# Patient Record
Sex: Female | Born: 1945
Health system: Southern US, Community
[De-identification: ages and names within clinical notes are randomized; demographics above are authoritative.]

## PROBLEM LIST (undated history)

## (undated) DIAGNOSIS — Z79890 Hormone replacement therapy (postmenopausal): Secondary | ICD-10-CM

## (undated) DIAGNOSIS — Z853 Personal history of malignant neoplasm of breast: Secondary | ICD-10-CM

## (undated) DIAGNOSIS — Z87898 Personal history of other specified conditions: Secondary | ICD-10-CM

## (undated) DIAGNOSIS — Z923 Personal history of irradiation: Secondary | ICD-10-CM

## (undated) DIAGNOSIS — E785 Hyperlipidemia, unspecified: Secondary | ICD-10-CM

## (undated) DIAGNOSIS — Z9889 Other specified postprocedural states: Secondary | ICD-10-CM

## (undated) DIAGNOSIS — R3915 Urgency of urination: Secondary | ICD-10-CM

## (undated) DIAGNOSIS — Z9221 Personal history of antineoplastic chemotherapy: Secondary | ICD-10-CM

## (undated) DIAGNOSIS — Z8489 Family history of other specified conditions: Secondary | ICD-10-CM

## (undated) DIAGNOSIS — E119 Type 2 diabetes mellitus without complications: Secondary | ICD-10-CM

## (undated) DIAGNOSIS — F419 Anxiety disorder, unspecified: Secondary | ICD-10-CM

## (undated) DIAGNOSIS — Z8719 Personal history of other diseases of the digestive system: Secondary | ICD-10-CM

## (undated) DIAGNOSIS — C801 Malignant (primary) neoplasm, unspecified: Secondary | ICD-10-CM

## (undated) DIAGNOSIS — Z87440 Personal history of urinary (tract) infections: Secondary | ICD-10-CM

## (undated) DIAGNOSIS — R32 Unspecified urinary incontinence: Secondary | ICD-10-CM

## (undated) DIAGNOSIS — G8929 Other chronic pain: Secondary | ICD-10-CM

## (undated) DIAGNOSIS — Z973 Presence of spectacles and contact lenses: Secondary | ICD-10-CM

## (undated) DIAGNOSIS — I10 Essential (primary) hypertension: Secondary | ICD-10-CM

## (undated) DIAGNOSIS — Z90710 Acquired absence of both cervix and uterus: Secondary | ICD-10-CM

## (undated) DIAGNOSIS — R112 Nausea with vomiting, unspecified: Secondary | ICD-10-CM

## (undated) DIAGNOSIS — J3089 Other allergic rhinitis: Secondary | ICD-10-CM

## (undated) DIAGNOSIS — N200 Calculus of kidney: Secondary | ICD-10-CM

## (undated) DIAGNOSIS — C50919 Malignant neoplasm of unspecified site of unspecified female breast: Secondary | ICD-10-CM

## (undated) DIAGNOSIS — M5030 Other cervical disc degeneration, unspecified cervical region: Secondary | ICD-10-CM

## (undated) DIAGNOSIS — E039 Hypothyroidism, unspecified: Secondary | ICD-10-CM

## (undated) DIAGNOSIS — Z780 Asymptomatic menopausal state: Secondary | ICD-10-CM

## (undated) HISTORY — PX: TONSILLECTOMY: SUR1361

## (undated) HISTORY — DX: Hypothyroidism, unspecified: E03.9

## (undated) HISTORY — PX: INCONTINENCE SURGERY: SHX676

## (undated) HISTORY — PX: PARTIAL MASTECTOMY WITH AXILLARY SENTINEL LYMPH NODE BIOPSY: SHX6004

## (undated) HISTORY — PX: CATARACT EXTRACTION W/ INTRAOCULAR LENS  IMPLANT, BILATERAL: SHX1307

## (undated) HISTORY — PX: VAGINAL HYSTERECTOMY: SUR661

## (undated) HISTORY — DX: Essential (primary) hypertension: I10

## (undated) HISTORY — PX: RETROPUBIC SLING: SHX2343

## (undated) HISTORY — PX: MASTECTOMY WITH AXILLARY LYMPH NODE DISSECTION: SHX5661

## (undated) HISTORY — DX: Unspecified urinary incontinence: R32

## (undated) HISTORY — DX: Hyperlipidemia, unspecified: E78.5

## (undated) HISTORY — DX: Acquired absence of both cervix and uterus: Z90.710

## (undated) HISTORY — DX: Anxiety disorder, unspecified: F41.9

## (undated) HISTORY — DX: Hormone replacement therapy: Z79.890

## (undated) HISTORY — DX: Asymptomatic menopausal state: Z78.0

## (undated) HISTORY — DX: Malignant neoplasm of unspecified site of unspecified female breast: C50.919

---

## 1999-01-13 ENCOUNTER — Other Ambulatory Visit: Admission: RE | Admit: 1999-01-13 | Discharge: 1999-01-13 | Payer: Self-pay | Admitting: *Deleted

## 1999-02-08 ENCOUNTER — Encounter: Payer: Self-pay | Admitting: *Deleted

## 1999-02-08 ENCOUNTER — Ambulatory Visit (HOSPITAL_COMMUNITY): Admission: RE | Admit: 1999-02-08 | Discharge: 1999-02-08 | Payer: Self-pay | Admitting: *Deleted

## 1999-02-22 ENCOUNTER — Ambulatory Visit (HOSPITAL_COMMUNITY): Admission: RE | Admit: 1999-02-22 | Discharge: 1999-02-22 | Payer: Self-pay | Admitting: Obstetrics and Gynecology

## 1999-02-22 ENCOUNTER — Encounter (INDEPENDENT_AMBULATORY_CARE_PROVIDER_SITE_OTHER): Payer: Self-pay | Admitting: Specialist

## 1999-06-09 ENCOUNTER — Other Ambulatory Visit: Admission: RE | Admit: 1999-06-09 | Discharge: 1999-06-09 | Payer: Self-pay | Admitting: *Deleted

## 2000-01-21 ENCOUNTER — Other Ambulatory Visit: Admission: RE | Admit: 2000-01-21 | Discharge: 2000-01-21 | Payer: Self-pay | Admitting: *Deleted

## 2001-01-29 ENCOUNTER — Other Ambulatory Visit: Admission: RE | Admit: 2001-01-29 | Discharge: 2001-01-29 | Payer: Self-pay | Admitting: *Deleted

## 2001-12-24 ENCOUNTER — Other Ambulatory Visit: Admission: RE | Admit: 2001-12-24 | Discharge: 2001-12-24 | Payer: Self-pay | Admitting: *Deleted

## 2002-02-04 HISTORY — PX: ABDOMINAL HYSTERECTOMY: SHX81

## 2002-02-19 ENCOUNTER — Observation Stay (HOSPITAL_COMMUNITY): Admission: RE | Admit: 2002-02-19 | Discharge: 2002-02-20 | Payer: Self-pay | Admitting: Obstetrics and Gynecology

## 2002-02-19 ENCOUNTER — Encounter (INDEPENDENT_AMBULATORY_CARE_PROVIDER_SITE_OTHER): Payer: Self-pay | Admitting: Specialist

## 2002-03-07 HISTORY — PX: BREAST LUMPECTOMY: SHX2

## 2002-04-07 HISTORY — PX: BREAST LUMPECTOMY: SHX2

## 2002-04-11 ENCOUNTER — Other Ambulatory Visit: Admission: RE | Admit: 2002-04-11 | Discharge: 2002-04-11 | Payer: Self-pay | Admitting: *Deleted

## 2002-04-12 ENCOUNTER — Encounter: Admission: RE | Admit: 2002-04-12 | Discharge: 2002-04-12 | Payer: Self-pay | Admitting: *Deleted

## 2002-04-12 ENCOUNTER — Encounter: Payer: Self-pay | Admitting: *Deleted

## 2002-04-18 ENCOUNTER — Encounter: Payer: Self-pay | Admitting: General Surgery

## 2002-04-18 ENCOUNTER — Ambulatory Visit (HOSPITAL_COMMUNITY): Admission: RE | Admit: 2002-04-18 | Discharge: 2002-04-19 | Payer: Self-pay | Admitting: General Surgery

## 2002-04-19 ENCOUNTER — Encounter: Admission: RE | Admit: 2002-04-19 | Discharge: 2002-04-19 | Payer: Self-pay | Admitting: General Surgery

## 2002-04-19 ENCOUNTER — Encounter: Payer: Self-pay | Admitting: General Surgery

## 2002-04-22 ENCOUNTER — Encounter: Payer: Self-pay | Admitting: *Deleted

## 2002-04-22 ENCOUNTER — Encounter (INDEPENDENT_AMBULATORY_CARE_PROVIDER_SITE_OTHER): Payer: Self-pay | Admitting: *Deleted

## 2002-04-22 ENCOUNTER — Encounter: Admission: RE | Admit: 2002-04-22 | Discharge: 2002-04-22 | Payer: Self-pay | Admitting: *Deleted

## 2002-04-22 HISTORY — PX: BREAST BIOPSY: SHX20

## 2002-04-24 ENCOUNTER — Ambulatory Visit (HOSPITAL_BASED_OUTPATIENT_CLINIC_OR_DEPARTMENT_OTHER): Admission: RE | Admit: 2002-04-24 | Discharge: 2002-04-24 | Payer: Self-pay | Admitting: General Surgery

## 2002-04-24 ENCOUNTER — Encounter: Payer: Self-pay | Admitting: General Surgery

## 2002-04-24 ENCOUNTER — Encounter (INDEPENDENT_AMBULATORY_CARE_PROVIDER_SITE_OTHER): Payer: Self-pay | Admitting: Specialist

## 2002-05-06 DIAGNOSIS — Z9221 Personal history of antineoplastic chemotherapy: Secondary | ICD-10-CM

## 2002-05-06 HISTORY — DX: Personal history of antineoplastic chemotherapy: Z92.21

## 2002-05-13 ENCOUNTER — Ambulatory Visit: Admission: RE | Admit: 2002-05-13 | Discharge: 2002-06-20 | Payer: Self-pay | Admitting: Radiation Oncology

## 2002-05-13 ENCOUNTER — Ambulatory Visit (HOSPITAL_COMMUNITY): Admission: RE | Admit: 2002-05-13 | Discharge: 2002-05-13 | Payer: Self-pay | Admitting: Oncology

## 2002-05-13 ENCOUNTER — Encounter: Payer: Self-pay | Admitting: Oncology

## 2002-05-15 ENCOUNTER — Encounter: Payer: Self-pay | Admitting: Oncology

## 2002-05-15 ENCOUNTER — Ambulatory Visit (HOSPITAL_COMMUNITY): Admission: RE | Admit: 2002-05-15 | Discharge: 2002-05-15 | Payer: Self-pay | Admitting: Oncology

## 2002-05-22 ENCOUNTER — Ambulatory Visit (HOSPITAL_BASED_OUTPATIENT_CLINIC_OR_DEPARTMENT_OTHER): Admission: RE | Admit: 2002-05-22 | Discharge: 2002-05-22 | Payer: Self-pay | Admitting: General Surgery

## 2002-05-22 ENCOUNTER — Encounter: Payer: Self-pay | Admitting: General Surgery

## 2002-07-11 ENCOUNTER — Inpatient Hospital Stay (HOSPITAL_COMMUNITY): Admission: EM | Admit: 2002-07-11 | Discharge: 2002-07-14 | Payer: Self-pay | Admitting: Emergency Medicine

## 2002-07-11 ENCOUNTER — Encounter: Payer: Self-pay | Admitting: *Deleted

## 2002-08-27 ENCOUNTER — Ambulatory Visit (HOSPITAL_COMMUNITY): Admission: RE | Admit: 2002-08-27 | Discharge: 2002-08-27 | Payer: Self-pay | Admitting: Oncology

## 2002-08-27 ENCOUNTER — Encounter: Payer: Self-pay | Admitting: Oncology

## 2002-11-25 ENCOUNTER — Encounter: Payer: Self-pay | Admitting: Oncology

## 2002-11-25 ENCOUNTER — Ambulatory Visit (HOSPITAL_COMMUNITY): Admission: RE | Admit: 2002-11-25 | Discharge: 2002-11-25 | Payer: Self-pay | Admitting: Oncology

## 2002-12-12 ENCOUNTER — Ambulatory Visit: Admission: RE | Admit: 2002-12-12 | Discharge: 2003-02-15 | Payer: Self-pay | Admitting: Radiation Oncology

## 2002-12-18 ENCOUNTER — Encounter: Admission: RE | Admit: 2002-12-18 | Discharge: 2002-12-18 | Payer: Self-pay | Admitting: Radiation Oncology

## 2002-12-26 DIAGNOSIS — Z923 Personal history of irradiation: Secondary | ICD-10-CM

## 2002-12-26 HISTORY — DX: Personal history of irradiation: Z92.3

## 2003-01-07 ENCOUNTER — Encounter: Admission: RE | Admit: 2003-01-07 | Discharge: 2003-01-07 | Payer: Self-pay | Admitting: Oncology

## 2003-02-26 ENCOUNTER — Ambulatory Visit (HOSPITAL_COMMUNITY): Admission: RE | Admit: 2003-02-26 | Discharge: 2003-02-26 | Payer: Self-pay | Admitting: Oncology

## 2003-03-04 ENCOUNTER — Ambulatory Visit (HOSPITAL_BASED_OUTPATIENT_CLINIC_OR_DEPARTMENT_OTHER): Admission: RE | Admit: 2003-03-04 | Discharge: 2003-03-04 | Payer: Self-pay | Admitting: General Surgery

## 2003-03-13 ENCOUNTER — Other Ambulatory Visit: Admission: RE | Admit: 2003-03-13 | Discharge: 2003-03-13 | Payer: Self-pay | Admitting: *Deleted

## 2003-03-24 ENCOUNTER — Encounter: Admission: RE | Admit: 2003-03-24 | Discharge: 2003-03-24 | Payer: Self-pay | Admitting: Oncology

## 2003-03-24 ENCOUNTER — Ambulatory Visit: Admission: RE | Admit: 2003-03-24 | Discharge: 2003-03-24 | Payer: Self-pay | Admitting: Radiation Oncology

## 2003-08-26 ENCOUNTER — Encounter: Admission: RE | Admit: 2003-08-26 | Discharge: 2003-08-26 | Payer: Self-pay | Admitting: *Deleted

## 2003-08-26 ENCOUNTER — Ambulatory Visit: Admission: RE | Admit: 2003-08-26 | Discharge: 2003-08-26 | Payer: Self-pay | Admitting: Radiation Oncology

## 2003-11-25 ENCOUNTER — Ambulatory Visit (HOSPITAL_COMMUNITY): Admission: RE | Admit: 2003-11-25 | Discharge: 2003-11-25 | Payer: Self-pay | Admitting: Oncology

## 2003-11-30 ENCOUNTER — Emergency Department (HOSPITAL_COMMUNITY): Admission: EM | Admit: 2003-11-30 | Discharge: 2003-12-01 | Payer: Self-pay

## 2004-01-08 ENCOUNTER — Ambulatory Visit: Payer: Self-pay | Admitting: Oncology

## 2004-02-27 ENCOUNTER — Ambulatory Visit: Payer: Self-pay | Admitting: Oncology

## 2004-04-15 ENCOUNTER — Encounter: Admission: RE | Admit: 2004-04-15 | Discharge: 2004-04-15 | Payer: Self-pay | Admitting: General Surgery

## 2004-04-15 ENCOUNTER — Ambulatory Visit: Payer: Self-pay | Admitting: Oncology

## 2004-05-11 ENCOUNTER — Encounter: Admission: RE | Admit: 2004-05-11 | Discharge: 2004-05-11 | Payer: Self-pay | Admitting: Oncology

## 2004-05-14 ENCOUNTER — Ambulatory Visit (HOSPITAL_COMMUNITY): Admission: RE | Admit: 2004-05-14 | Discharge: 2004-05-14 | Payer: Self-pay | Admitting: Oncology

## 2004-06-01 ENCOUNTER — Ambulatory Visit: Payer: Self-pay | Admitting: Oncology

## 2004-08-10 ENCOUNTER — Ambulatory Visit: Payer: Self-pay | Admitting: Oncology

## 2004-10-08 ENCOUNTER — Encounter: Admission: RE | Admit: 2004-10-08 | Discharge: 2004-10-08 | Payer: Self-pay | Admitting: General Surgery

## 2004-11-17 ENCOUNTER — Ambulatory Visit: Payer: Self-pay | Admitting: Oncology

## 2005-02-15 ENCOUNTER — Ambulatory Visit: Payer: Self-pay | Admitting: Oncology

## 2005-04-18 ENCOUNTER — Encounter: Admission: RE | Admit: 2005-04-18 | Discharge: 2005-04-18 | Payer: Self-pay | Admitting: Oncology

## 2005-06-22 ENCOUNTER — Ambulatory Visit: Payer: Self-pay | Admitting: Oncology

## 2005-12-06 ENCOUNTER — Ambulatory Visit: Payer: Self-pay | Admitting: Oncology

## 2005-12-08 ENCOUNTER — Ambulatory Visit (HOSPITAL_COMMUNITY): Admission: RE | Admit: 2005-12-08 | Discharge: 2005-12-08 | Payer: Self-pay | Admitting: Oncology

## 2005-12-16 ENCOUNTER — Ambulatory Visit (HOSPITAL_COMMUNITY): Admission: RE | Admit: 2005-12-16 | Discharge: 2005-12-16 | Payer: Self-pay | Admitting: Oncology

## 2006-01-18 ENCOUNTER — Encounter: Admission: RE | Admit: 2006-01-18 | Discharge: 2006-01-18 | Payer: Self-pay | Admitting: Sports Medicine

## 2006-02-10 ENCOUNTER — Encounter: Admission: RE | Admit: 2006-02-10 | Discharge: 2006-02-10 | Payer: Self-pay | Admitting: Sports Medicine

## 2006-04-19 ENCOUNTER — Encounter: Admission: RE | Admit: 2006-04-19 | Discharge: 2006-04-19 | Payer: Self-pay | Admitting: Oncology

## 2006-05-15 ENCOUNTER — Encounter: Admission: RE | Admit: 2006-05-15 | Discharge: 2006-05-15 | Payer: Self-pay | Admitting: Oncology

## 2006-07-10 ENCOUNTER — Ambulatory Visit: Payer: Self-pay | Admitting: Oncology

## 2006-07-11 LAB — COMPREHENSIVE METABOLIC PANEL
ALT: 26 U/L (ref 0–35)
AST: 26 U/L (ref 0–37)
CO2: 25 mEq/L (ref 19–32)
Calcium: 9.5 mg/dL (ref 8.4–10.5)
Chloride: 103 mEq/L (ref 96–112)
Potassium: 4.3 mEq/L (ref 3.5–5.3)
Sodium: 139 mEq/L (ref 135–145)
Total Protein: 6.9 g/dL (ref 6.0–8.3)

## 2006-07-11 LAB — CBC WITH DIFFERENTIAL/PLATELET
BASO%: 3.2 % — ABNORMAL HIGH (ref 0.0–2.0)
Eosinophils Absolute: 0.2 10*3/uL (ref 0.0–0.5)
MONO#: 0.3 10*3/uL (ref 0.1–0.9)
NEUT#: 0.8 10*3/uL — ABNORMAL LOW (ref 1.5–6.5)
Platelets: 231 10*3/uL (ref 145–400)
RBC: 4.64 10*6/uL (ref 3.70–5.32)
RDW: 11.4 % (ref 11.3–14.5)
WBC: 3.4 10*3/uL — ABNORMAL LOW (ref 3.9–10.0)
lymph#: 2 10*3/uL (ref 0.9–3.3)

## 2006-07-11 LAB — CHCC SMEAR

## 2007-01-08 ENCOUNTER — Ambulatory Visit: Payer: Self-pay | Admitting: Oncology

## 2007-01-10 LAB — CBC WITH DIFFERENTIAL/PLATELET
Basophils Absolute: 0 10*3/uL (ref 0.0–0.1)
EOS%: 1.5 % (ref 0.0–7.0)
Eosinophils Absolute: 0 10*3/uL (ref 0.0–0.5)
HCT: 35.3 % (ref 34.8–46.6)
HGB: 12.4 g/dL (ref 11.6–15.9)
MCH: 29.9 pg (ref 26.0–34.0)
MCV: 85.2 fL (ref 81.0–101.0)
MONO%: 15.9 % — ABNORMAL HIGH (ref 0.0–13.0)
NEUT#: 0.8 10*3/uL — ABNORMAL LOW (ref 1.5–6.5)
NEUT%: 38.4 % — ABNORMAL LOW (ref 39.6–76.8)
Platelets: 256 10*3/uL (ref 145–400)

## 2007-01-10 LAB — COMPREHENSIVE METABOLIC PANEL
AST: 21 U/L (ref 0–37)
Albumin: 4.2 g/dL (ref 3.5–5.2)
Alkaline Phosphatase: 44 U/L (ref 39–117)
BUN: 14 mg/dL (ref 6–23)
Calcium: 10.1 mg/dL (ref 8.4–10.5)
Creatinine, Ser: 0.77 mg/dL (ref 0.40–1.20)
Glucose, Bld: 104 mg/dL — ABNORMAL HIGH (ref 70–99)

## 2007-01-10 LAB — LACTATE DEHYDROGENASE: LDH: 156 U/L (ref 94–250)

## 2007-04-23 ENCOUNTER — Encounter: Admission: RE | Admit: 2007-04-23 | Discharge: 2007-04-23 | Payer: Self-pay | Admitting: Oncology

## 2007-05-01 ENCOUNTER — Encounter: Admission: RE | Admit: 2007-05-01 | Discharge: 2007-05-01 | Payer: Self-pay | Admitting: Oncology

## 2007-05-04 ENCOUNTER — Encounter (INDEPENDENT_AMBULATORY_CARE_PROVIDER_SITE_OTHER): Payer: Self-pay | Admitting: Diagnostic Radiology

## 2007-05-04 ENCOUNTER — Encounter: Admission: RE | Admit: 2007-05-04 | Discharge: 2007-05-04 | Payer: Self-pay | Admitting: Oncology

## 2007-05-04 HISTORY — PX: BREAST BIOPSY: SHX20

## 2007-07-09 ENCOUNTER — Ambulatory Visit: Payer: Self-pay | Admitting: Oncology

## 2007-07-12 LAB — CBC WITH DIFFERENTIAL/PLATELET
BASO%: 0.5 % (ref 0.0–2.0)
Basophils Absolute: 0 10*3/uL (ref 0.0–0.1)
Eosinophils Absolute: 0.1 10*3/uL (ref 0.0–0.5)
HCT: 36.5 % (ref 34.8–46.6)
HGB: 12.5 g/dL (ref 11.6–15.9)
LYMPH%: 51.9 % — ABNORMAL HIGH (ref 14.0–48.0)
MONO#: 0.4 10*3/uL (ref 0.1–0.9)
NEUT#: 1 10*3/uL — ABNORMAL LOW (ref 1.5–6.5)
NEUT%: 31.4 % — ABNORMAL LOW (ref 39.6–76.8)
Platelets: 291 10*3/uL (ref 145–400)
WBC: 3 10*3/uL — ABNORMAL LOW (ref 3.9–10.0)
lymph#: 1.6 10*3/uL (ref 0.9–3.3)

## 2007-07-13 LAB — VITAMIN D 25 HYDROXY (VIT D DEFICIENCY, FRACTURES): Vit D, 25-Hydroxy: 30 ng/mL (ref 30–89)

## 2007-07-13 LAB — COMPREHENSIVE METABOLIC PANEL
ALT: 24 U/L (ref 0–35)
CO2: 24 mEq/L (ref 19–32)
Calcium: 9.4 mg/dL (ref 8.4–10.5)
Chloride: 107 mEq/L (ref 96–112)
Creatinine, Ser: 0.74 mg/dL (ref 0.40–1.20)
Glucose, Bld: 130 mg/dL — ABNORMAL HIGH (ref 70–99)
Total Bilirubin: 0.4 mg/dL (ref 0.3–1.2)

## 2007-07-13 LAB — CANCER ANTIGEN 27.29: CA 27.29: 13 U/mL (ref 0–39)

## 2007-07-13 LAB — FSH/LH: LH: 27.2 m[IU]/mL

## 2007-07-13 LAB — LACTATE DEHYDROGENASE: LDH: 171 U/L (ref 94–250)

## 2007-07-16 ENCOUNTER — Ambulatory Visit (HOSPITAL_COMMUNITY): Admission: RE | Admit: 2007-07-16 | Discharge: 2007-07-16 | Payer: Self-pay | Admitting: Oncology

## 2007-07-19 LAB — ESTRADIOL, ULTRA SENS: Estradiol, Ultra Sensitive: <2 pg/mL

## 2008-01-08 ENCOUNTER — Ambulatory Visit: Payer: Self-pay | Admitting: Oncology

## 2008-01-10 LAB — CBC WITH DIFFERENTIAL/PLATELET
BASO%: 0.4 % (ref 0.0–2.0)
EOS%: 5.4 % (ref 0.0–7.0)
MCH: 29.9 pg (ref 26.0–34.0)
MCHC: 33.8 g/dL (ref 32.0–36.0)
MONO#: 0.4 10*3/uL (ref 0.1–0.9)
RBC: 4.51 10*6/uL (ref 3.70–5.32)
WBC: 3.5 10*3/uL — ABNORMAL LOW (ref 3.9–10.0)
lymph#: 1.7 10*3/uL (ref 0.9–3.3)

## 2008-01-11 LAB — COMPREHENSIVE METABOLIC PANEL
ALT: 22 U/L (ref 0–35)
AST: 20 U/L (ref 0–37)
CO2: 23 mEq/L (ref 19–32)
Calcium: 9.9 mg/dL (ref 8.4–10.5)
Chloride: 106 mEq/L (ref 96–112)
Sodium: 141 mEq/L (ref 135–145)
Total Bilirubin: 0.3 mg/dL (ref 0.3–1.2)
Total Protein: 7.3 g/dL (ref 6.0–8.3)

## 2008-01-11 LAB — VITAMIN D 25 HYDROXY (VIT D DEFICIENCY, FRACTURES): Vit D, 25-Hydroxy: 38 ng/mL (ref 30–89)

## 2008-01-11 LAB — LACTATE DEHYDROGENASE: LDH: 171 U/L (ref 94–250)

## 2008-05-05 ENCOUNTER — Encounter: Admission: RE | Admit: 2008-05-05 | Discharge: 2008-05-05 | Payer: Self-pay | Admitting: Oncology

## 2008-06-19 ENCOUNTER — Telehealth (INDEPENDENT_AMBULATORY_CARE_PROVIDER_SITE_OTHER): Payer: Self-pay

## 2008-06-23 ENCOUNTER — Ambulatory Visit: Payer: Self-pay

## 2008-07-08 ENCOUNTER — Ambulatory Visit: Payer: Self-pay | Admitting: Oncology

## 2008-07-10 LAB — CBC WITH DIFFERENTIAL/PLATELET
BASO%: 0.8 % (ref 0.0–2.0)
Basophils Absolute: 0 10*3/uL (ref 0.0–0.1)
Eosinophils Absolute: 0.2 10*3/uL (ref 0.0–0.5)
HCT: 40.6 % (ref 34.8–46.6)
HGB: 13.8 g/dL (ref 11.6–15.9)
LYMPH%: 51.6 % — ABNORMAL HIGH (ref 14.0–49.7)
MCHC: 34 g/dL (ref 31.5–36.0)
MONO#: 0.4 10*3/uL (ref 0.1–0.9)
NEUT%: 30.2 % — ABNORMAL LOW (ref 38.4–76.8)
Platelets: 265 10*3/uL (ref 145–400)
WBC: 3.5 10*3/uL — ABNORMAL LOW (ref 3.9–10.3)
lymph#: 1.8 10*3/uL (ref 0.9–3.3)

## 2008-07-11 LAB — COMPREHENSIVE METABOLIC PANEL
ALT: 23 U/L (ref 0–35)
BUN: 13 mg/dL (ref 6–23)
CO2: 25 mEq/L (ref 19–32)
Calcium: 9.3 mg/dL (ref 8.4–10.5)
Chloride: 102 mEq/L (ref 96–112)
Creatinine, Ser: 0.83 mg/dL (ref 0.40–1.20)
Glucose, Bld: 151 mg/dL — ABNORMAL HIGH (ref 70–99)
Total Bilirubin: 0.4 mg/dL (ref 0.3–1.2)

## 2008-07-11 LAB — CANCER ANTIGEN 27.29: CA 27.29: 12 U/mL (ref 0–39)

## 2008-07-11 LAB — VITAMIN D 25 HYDROXY (VIT D DEFICIENCY, FRACTURES): Vit D, 25-Hydroxy: 46 ng/mL (ref 30–89)

## 2008-07-11 LAB — LACTATE DEHYDROGENASE: LDH: 166 U/L (ref 94–250)

## 2009-03-26 ENCOUNTER — Other Ambulatory Visit: Admission: RE | Admit: 2009-03-26 | Discharge: 2009-03-26 | Payer: Self-pay | Admitting: Family Medicine

## 2009-05-11 ENCOUNTER — Encounter: Admission: RE | Admit: 2009-05-11 | Discharge: 2009-05-11 | Payer: Self-pay | Admitting: Oncology

## 2009-07-07 ENCOUNTER — Ambulatory Visit: Payer: Self-pay | Admitting: Oncology

## 2009-07-08 LAB — CBC WITH DIFFERENTIAL/PLATELET
BASO%: 0.5 % (ref 0.0–2.0)
HCT: 39.4 % (ref 34.8–46.6)
LYMPH%: 58.1 % — ABNORMAL HIGH (ref 14.0–49.7)
MCHC: 33.6 g/dL (ref 31.5–36.0)
MCV: 85.9 fL (ref 79.5–101.0)
MONO#: 0.4 10*3/uL (ref 0.1–0.9)
MONO%: 11.3 % (ref 0.0–14.0)
NEUT%: 25.2 % — ABNORMAL LOW (ref 38.4–76.8)
Platelets: 268 10*3/uL (ref 145–400)
WBC: 3.3 10*3/uL — ABNORMAL LOW (ref 3.9–10.3)

## 2009-07-09 LAB — COMPREHENSIVE METABOLIC PANEL
AST: 23 U/L (ref 0–37)
BUN: 16 mg/dL (ref 6–23)
CO2: 26 mEq/L (ref 19–32)
Calcium: 9.7 mg/dL (ref 8.4–10.5)
Chloride: 103 mEq/L (ref 96–112)
Creatinine, Ser: 0.77 mg/dL (ref 0.40–1.20)

## 2009-07-09 LAB — CANCER ANTIGEN 27.29: CA 27.29: 13 U/mL (ref 0–39)

## 2010-03-07 HISTORY — PX: MASTECTOMY: SHX3

## 2010-03-22 ENCOUNTER — Ambulatory Visit
Admission: RE | Admit: 2010-03-22 | Discharge: 2010-03-23 | Payer: Self-pay | Source: Home / Self Care | Attending: Urology | Admitting: Urology

## 2010-03-24 LAB — GLUCOSE, CAPILLARY
Glucose-Capillary: 161 mg/dL — ABNORMAL HIGH (ref 70–99)
Glucose-Capillary: 192 mg/dL — ABNORMAL HIGH (ref 70–99)
Glucose-Capillary: 115 mg/dL — ABNORMAL HIGH (ref 70–99)
Glucose-Capillary: 118 mg/dL — ABNORMAL HIGH (ref 70–99)

## 2010-03-24 LAB — POCT I-STAT 4, (NA,K, GLUC, HGB,HCT)
Glucose, Bld: 140 mg/dL — ABNORMAL HIGH (ref 70–99)
HCT: 43 % (ref 36.0–46.0)
Hemoglobin: 14.6 g/dL (ref 12.0–15.0)
Potassium: 3.7 mEq/L (ref 3.5–5.1)
Sodium: 139 mEq/L (ref 135–145)

## 2010-03-27 ENCOUNTER — Other Ambulatory Visit: Payer: Self-pay | Admitting: Oncology

## 2010-03-27 ENCOUNTER — Encounter: Payer: Self-pay | Admitting: Oncology

## 2010-03-27 DIAGNOSIS — Z1239 Encounter for other screening for malignant neoplasm of breast: Secondary | ICD-10-CM

## 2010-03-27 DIAGNOSIS — Z780 Asymptomatic menopausal state: Secondary | ICD-10-CM

## 2010-03-27 DIAGNOSIS — Z1231 Encounter for screening mammogram for malignant neoplasm of breast: Secondary | ICD-10-CM

## 2010-03-28 ENCOUNTER — Encounter: Payer: Self-pay | Admitting: Sports Medicine

## 2010-03-28 ENCOUNTER — Encounter: Payer: Self-pay | Admitting: Oncology

## 2010-03-29 NOTE — Op Note (Signed)
NAMEELIZABELLE, Alyssa Jackson               ACCOUNT NO.:  [id number]  MEDICAL RECORD NO.:  [id number]          PATIENT TYPE:  AMB  LOCATION:  [facility]                         FACILITY:  [facility]  PHYSICIAN:  Valetta Fuller, M.D.  DATE OF BIRTH:  [birth year]  DATE OF PROCEDURE:  03/22/2010 DATE OF DISCHARGE:                              OPERATIVE REPORT   PREOPERATIVE DIAGNOSIS:  Stress urinary incontinence.  POSTOPERATIVE DIAGNOSIS:  Stress urinary incontinence.  PROCEDURE PERFORMED:  [place] retropubic suburethral sling.  SURGEON:  Valetta Fuller, M.D.  ANESTHESIA:  General.  INDICATIONS:  [name] is 65 years of age.  She has had some longstanding and progressive stress urinary incontinence.  Urodynamics revealed fairly normal bladder sensation without evidence of instability and documented stress incontinence.  Valsalva leak point pressure to 100 mL with 77 cm of water pressure.  The patient did have evidence of significant urethral hypomobility and documented stress incontinence. She was felt to be a good candidate for suburethral sling.  She appeared to understand the advantages, disadvantages, potential complications, success rates and issues with regard to sling.  She presents now for the procedure having given full informed consent.  TECHNIQUE AND FINDINGS:  The patient was brought to the operating room. She had successful induction of general anesthesia.  She was placed in the mid lithotomy position and prepped and draped in usual manner.  PAS compression boots were utilized and she received perioperative ciprofloxacin.  Appropriate surgical time-out was performed.  Foley catheter was placed in the bladder completely drained.  Weighted vaginal speculum was utilized.  In the Foley relaxed position, the patient did have mild to moderate cystocele.  Anterior vaginal mucosa was infiltrated with lidocaine overlying the mid urethra.  A small incision was made at the mid urethra and the  planes developed until we were able to palpate under the retropubic space bilaterally.  Two small stab incisions were made just above the pubic symphysis just to the lateral of midline.  With the Foley catheter in place and the bladder completely drained, the retropubic needles were placed bilaterally with direct digital finger control.  Once needle position was performed, the Foley catheter was removed and flexible cystoscopy was performed.  There was no evidence of needle injury or placement within the bladder.  The Foley catheter was replaced and the bladder was again drained.  The [place] sling was attached to the needles bilaterally.  A right-angle clamp was placed at the mid urethra behind the sling to ensure proper tensioning.  The sling was then pulled up through the retropubic incisions.  The sleeves of the sling were then removed in the standard manner.  The sling appeared to be well positioned at the mid urethra.  Excess sling was cut at the level of the retropubic incisions.  The incision was copiously irrigated.  The vaginal mucosa was then closed with a running 2-0 Vicryl suture and vaginal packing with Estrace utilized.  The retropubic incisions were closed with Dermabond and the Foley catheter was left indwelling draining clear urine.  No obvious complications occurred.  The patient appeared to do well and was brought  to recovery room in stable condition.     Valetta Fuller, M.D.     DSG/MEDQ  D:  03/22/2010  T:  03/22/2010  Job:  [id number]  Electronically Signed by Barron Alvine M.D. on 03/29/2010 09:22:43 AM

## 2010-04-02 ENCOUNTER — Observation Stay (HOSPITAL_COMMUNITY)
Admission: EM | Admit: 2010-04-02 | Discharge: 2010-04-03 | Payer: Self-pay | Source: Home / Self Care | Attending: Urology | Admitting: Urology

## 2010-04-02 LAB — CBC
Hemoglobin: 13.3 g/dL (ref 12.0–15.0)
MCHC: 35 g/dL (ref 30.0–36.0)
MCV: 81.5 fL (ref 78.0–100.0)
RBC: 4.66 MIL/uL (ref 3.87–5.11)
RDW: 13 % (ref 11.5–15.5)
WBC: 6 10*3/uL (ref 4.0–10.5)

## 2010-04-02 LAB — DIFFERENTIAL
Basophils Absolute: 0 10*3/uL (ref 0.0–0.1)
Eosinophils Absolute: 0.2 10*3/uL (ref 0.0–0.7)
Eosinophils Relative: 3 % (ref 0–5)
Lymphocytes Relative: 39 % (ref 12–46)
Lymphs Abs: 2.3 10*3/uL (ref 0.7–4.0)
Monocytes Absolute: 0.7 10*3/uL (ref 0.1–1.0)
Neutro Abs: 2.8 10*3/uL (ref 1.7–7.7)

## 2010-04-02 LAB — BASIC METABOLIC PANEL
Calcium: 10 mg/dL (ref 8.4–10.5)
Chloride: 104 mEq/L (ref 96–112)
GFR calc non Af Amer: >60 mL/min (ref >60)
Glucose, Bld: 101 mg/dL — ABNORMAL HIGH (ref 70–99)

## 2010-04-02 LAB — APTT: aPTT: 31 seconds (ref 24–37)

## 2010-04-03 LAB — CBC
HCT: 38.1 % (ref 36.0–46.0)
Hemoglobin: 12.7 g/dL (ref 12.0–15.0)
MCH: 27.6 pg (ref 26.0–34.0)
MCV: 82.8 fL (ref 78.0–100.0)
RBC: 4.6 MIL/uL (ref 3.87–5.11)

## 2010-05-10 ENCOUNTER — Other Ambulatory Visit: Payer: Self-pay | Admitting: General Surgery

## 2010-05-10 DIAGNOSIS — Z9889 Other specified postprocedural states: Secondary | ICD-10-CM

## 2010-05-20 ENCOUNTER — Ambulatory Visit
Admission: RE | Admit: 2010-05-20 | Discharge: 2010-05-20 | Disposition: A | Discharge status: Home / Self Care | Payer: Medicare Other | Source: Ambulatory Visit | Attending: Oncology | Admitting: Oncology

## 2010-05-20 DIAGNOSIS — Z1231 Encounter for screening mammogram for malignant neoplasm of breast: Secondary | ICD-10-CM

## 2010-05-20 DIAGNOSIS — Z780 Asymptomatic menopausal state: Secondary | ICD-10-CM

## 2010-05-20 NOTE — H&P (Signed)
Alyssa Jackson, Alyssa Jackson               ACCOUNT NO.:  [id number]  MEDICAL RECORD NO.:  [id number]          PATIENT TYPE:  OBV  LOCATION:  1404                         FACILITY:  [facility]  PHYSICIAN:  Martina Sinner, MD DATE OF BIRTH:  10-03-1945  DATE OF ADMISSION:  04/02/2010 DATE OF DISCHARGE:                             HISTORY & PHYSICAL   We will admit the patient to Dr. Sherron Monday.  HISTORY OF PRESENT ILLNESS:  Alyssa Jackson is status post a recent retropubic suburethral sling procedure by Dr. Barron Alvine on March 22, 2010.  She has been getting along well and healing in the usual fashion until this afternoon when she was out car shopping and noticed a sudden rush of blood vaginally.  She did notice some passage of clots at that time as well.  She denied any pain at that time and is having no acute pain currently.  She denies any lower urinary tract symptoms, specifically dysuria, gross hematuria, increased frequency, urgency or any urinary leakage.  She denies any fever, chills, nausea or vomiting recently.  PAST MEDICAL HISTORY: 1. History of anxiety. 2. History of asthma. 3. History of breast cancer. 4. History of depression. 5. History of diabetes mellitus. 6. History of heartburn. 7. History of hypothyroidism. 8. History of seizure.  PAST SURGICAL HISTORY: 1. History of breast surgery for mastectomy on the left. 2. History of hysterectomy. 3. History of tonsillectomy. 4. History of retropubic suburethral sling procedure, March 22, 2010.  CURRENT MEDICATIONS:  The patient is currently taking: 1. Metformin HCl 1000 mg p.o. b.i.d. 2. Quinapril HCL 20 mg p.o. q.a.m. 3. Singulair 10 mg p.o. daily. 4. Synthroid 112 mcg p.o. daily. 5. Albuterol rescue inhaler risk p.r.n.  ALLERGIES: 1. THE PATIENT HAS A KNOWN ALLERGY TO PENICILLIN. 2. SULFA DRUGS. 3. LATEX. 4. CONTRAST DYE.  FAMILY HISTORY: 1. Paternal history of cancer. 2. Paternal history of diabetes  mellitus. 3. Number of children, 3 sons.  SOCIAL HISTORY: 1. Caffeine use. 2. She is currently married. 3. Occupation.  She is a retired Comptroller. 4. She denies any alcohol use. 5. Denies history of tobacco use.  BRIEF PHYSICAL EXAMINATION:  GENERAL:  The patient is alert and oriented x3, in no acute distress. ABDOMEN:  Abdomen is soft, nontender and nondistended with normal bowel sounds. GENITOURINARY EXAMINATION:  Demonstrates active vaginal bleeding with passage of some clots. VITAL SIGNS ON ADMISSION:  Temperature 98.0, pulse 93, respirations 20, blood pressure 142/82.  LABORATORY DATA:  CBC performed at time of admission is within normal limits with a white blood cell count of 6.0, RBC 4.6, hemoglobin 13.3, hematocrit 38.0.  A BMET was performed which is within normal limits as well with a BUN and creatinine noted at 12 and 0.67 respectively.  ASSESSMENT: 1. Female stress incontinence.     a.     Postoperative bleeding, status post recent retropubic      suburethral sling procedure performed on March 22, 2010.  PLAN:  I have performed vaginal packing in the emergency department using packing soaked with Estrace cream.  We will admit her for observation.  We  will plan to full packing in the morning and reassess the situation at that time.  Further treatment decisions will be made at that time.    ______________________________ Marcello Fennel, PA   ______________________________ Martina Sinner, MD    AB/MEDQ  D:  04/02/2010  T:  04/02/2010  Job:  [id number]  Electronically Signed by Alfredo Martinez MD on 05/20/2010 12:48:52 PM

## 2010-05-27 ENCOUNTER — Ambulatory Visit
Admission: RE | Admit: 2010-05-27 | Discharge: 2010-05-27 | Disposition: A | Discharge status: Home / Self Care | Payer: Medicare Other | Source: Ambulatory Visit | Attending: General Surgery | Admitting: General Surgery

## 2010-05-27 DIAGNOSIS — Z9889 Other specified postprocedural states: Secondary | ICD-10-CM

## 2010-05-27 MED ORDER — GADOBENATE DIMEGLUMINE 529 MG/ML IV SOLN
15.0000 mL | Freq: Once | INTRAVENOUS | Status: AC | PRN
  Administered 2010-05-27: 15 mL via INTRAVENOUS

## 2010-05-31 ENCOUNTER — Other Ambulatory Visit: Payer: Self-pay | Admitting: General Surgery

## 2010-05-31 DIAGNOSIS — R928 Other abnormal and inconclusive findings on diagnostic imaging of breast: Secondary | ICD-10-CM

## 2010-06-02 ENCOUNTER — Other Ambulatory Visit: Payer: Self-pay | Admitting: Diagnostic Radiology

## 2010-06-02 ENCOUNTER — Ambulatory Visit
Admission: RE | Admit: 2010-06-02 | Discharge: 2010-06-02 | Disposition: A | Discharge status: Home / Self Care | Payer: Medicare Other | Source: Ambulatory Visit | Attending: General Surgery | Admitting: General Surgery

## 2010-06-02 ENCOUNTER — Other Ambulatory Visit: Payer: Self-pay | Admitting: General Surgery

## 2010-06-02 DIAGNOSIS — R928 Other abnormal and inconclusive findings on diagnostic imaging of breast: Secondary | ICD-10-CM

## 2010-06-02 HISTORY — PX: BREAST BIOPSY: SHX20

## 2010-06-15 ENCOUNTER — Other Ambulatory Visit: Payer: Self-pay | Admitting: Oncology

## 2010-06-15 DIAGNOSIS — C50919 Malignant neoplasm of unspecified site of unspecified female breast: Secondary | ICD-10-CM

## 2010-06-16 ENCOUNTER — Other Ambulatory Visit: Payer: Self-pay | Admitting: Oncology

## 2010-06-16 ENCOUNTER — Encounter (HOSPITAL_BASED_OUTPATIENT_CLINIC_OR_DEPARTMENT_OTHER): Payer: Medicare Other | Admitting: Oncology

## 2010-06-16 DIAGNOSIS — C50219 Malignant neoplasm of upper-inner quadrant of unspecified female breast: Secondary | ICD-10-CM

## 2010-06-16 DIAGNOSIS — E119 Type 2 diabetes mellitus without complications: Secondary | ICD-10-CM

## 2010-06-16 DIAGNOSIS — D72819 Decreased white blood cell count, unspecified: Secondary | ICD-10-CM

## 2010-06-16 DIAGNOSIS — Z170 Estrogen receptor positive status [ER+]: Secondary | ICD-10-CM

## 2010-06-16 LAB — CBC WITH DIFFERENTIAL/PLATELET
BASO%: 0.6 % (ref 0.0–2.0)
EOS%: 3.9 % (ref 0.0–7.0)
MCH: 27.6 pg (ref 25.1–34.0)
MCHC: 33.5 g/dL (ref 31.5–36.0)
RBC: 4.62 10*6/uL (ref 3.70–5.45)
RDW: 13.1 % (ref 11.2–14.5)
lymph#: 1.9 10*3/uL (ref 0.9–3.3)

## 2010-06-16 LAB — COMPREHENSIVE METABOLIC PANEL
ALT: 35 U/L (ref 0–35)
AST: 31 U/L (ref 0–37)
Albumin: 4 g/dL (ref 3.5–5.2)
Calcium: 9.7 mg/dL (ref 8.4–10.5)
Chloride: 101 mEq/L (ref 96–112)
Creatinine, Ser: 0.71 mg/dL (ref 0.40–1.20)
Potassium: 4.2 mEq/L (ref 3.5–5.3)
Sodium: 138 mEq/L (ref 135–145)

## 2010-06-16 LAB — WHOLE BLOOD GLUCOSE
Glucose: 161 mg/dL — ABNORMAL HIGH (ref 70–100)
HRS PC: 8 Hours

## 2010-06-21 ENCOUNTER — Other Ambulatory Visit (HOSPITAL_COMMUNITY): Payer: Medicare Other

## 2010-06-22 ENCOUNTER — Encounter (HOSPITAL_COMMUNITY)
Admission: RE | Admit: 2010-06-22 | Discharge: 2010-06-22 | Disposition: A | Discharge status: Home / Self Care | Payer: Medicare Other | Source: Ambulatory Visit | Attending: Oncology | Admitting: Oncology

## 2010-06-22 ENCOUNTER — Encounter (HOSPITAL_COMMUNITY): Payer: Self-pay

## 2010-06-22 DIAGNOSIS — K5730 Diverticulosis of large intestine without perforation or abscess without bleeding: Secondary | ICD-10-CM | POA: Insufficient documentation

## 2010-06-22 DIAGNOSIS — J984 Other disorders of lung: Secondary | ICD-10-CM | POA: Insufficient documentation

## 2010-06-22 DIAGNOSIS — K7689 Other specified diseases of liver: Secondary | ICD-10-CM | POA: Insufficient documentation

## 2010-06-22 DIAGNOSIS — N200 Calculus of kidney: Secondary | ICD-10-CM | POA: Insufficient documentation

## 2010-06-22 DIAGNOSIS — N281 Cyst of kidney, acquired: Secondary | ICD-10-CM | POA: Insufficient documentation

## 2010-06-22 DIAGNOSIS — C50919 Malignant neoplasm of unspecified site of unspecified female breast: Secondary | ICD-10-CM

## 2010-06-22 HISTORY — DX: Malignant (primary) neoplasm, unspecified: C80.1

## 2010-06-22 LAB — GLUCOSE, CAPILLARY: Glucose-Capillary: 159 mg/dL — ABNORMAL HIGH (ref 70–99)

## 2010-06-22 MED ORDER — FLUDEOXYGLUCOSE F - 18 (FDG) INJECTION
15.9000 | Freq: Once | INTRAVENOUS | Status: AC | PRN
  Administered 2010-06-22: 15.9 via INTRAVENOUS

## 2010-07-16 ENCOUNTER — Encounter (HOSPITAL_BASED_OUTPATIENT_CLINIC_OR_DEPARTMENT_OTHER): Payer: Medicare Other | Admitting: Oncology

## 2010-07-16 ENCOUNTER — Other Ambulatory Visit: Payer: Self-pay | Admitting: Oncology

## 2010-07-16 DIAGNOSIS — Z853 Personal history of malignant neoplasm of breast: Secondary | ICD-10-CM

## 2010-07-16 DIAGNOSIS — N6489 Other specified disorders of breast: Secondary | ICD-10-CM

## 2010-07-20 ENCOUNTER — Ambulatory Visit
Admission: RE | Admit: 2010-07-20 | Discharge: 2010-07-20 | Disposition: A | Discharge status: Home / Self Care | Payer: Medicare Other | Source: Ambulatory Visit | Attending: Oncology | Admitting: Oncology

## 2010-07-20 DIAGNOSIS — N6489 Other specified disorders of breast: Secondary | ICD-10-CM

## 2010-07-20 NOTE — Procedures (Signed)
[facility]                             NUCLEAR IMAGING STUDY   NAME:COTHERNMarrissa, Dai                      MRN:          [id number]  DATE:06/23/2008                            DOB:          1945/09/04    IDENTIFICATION:  The patient is a 65 year old female who has breast  cancer and is undergoing chemotherapy.  This study is performed to  quantify left ventricular function.   MUGA INFORMATION:  The patient's red blood cells were labeled using the  altered tag method with 33 mCi of technetium 99 label pertechnetate.  The images were reconstructed in the anterior, lateral, and left  anterior oblique views.  The left ventricular function was normal with  normal wall motion.  The calculated ejection fraction was 56%.   IMPRESSION:  Rest, left ventricular ejection fraction of 56%.  The  overall left lower function is normal and the wall motion is normal.     Madolyn Frieze. Jens Som, MD, [facility]  Electronically Signed    BSC/MedQ  DD: 06/23/2008  DT: 06/24/2008  Job #: [id number]

## 2010-07-23 NOTE — Op Note (Signed)
   NAME:  Alyssa Jackson, Alyssa Jackson                         ACCOUNT NO.:  [id number]   MEDICAL RECORD NO.:  [id number]                   PATIENT TYPE:  AMB   LOCATION:  DSC                                  FACILITY:  [facility]   PHYSICIAN:  Rose Phi. Maple Hudson, M.D.                DATE OF BIRTH:  01-Mar-1946   DATE OF PROCEDURE:  05/22/2002  DATE OF DISCHARGE:                                 OPERATIVE REPORT   PREOPERATIVE DIAGNOSIS:  Stage II carcinoma of the left breast.   POSTOPERATIVE DIAGNOSIS:  Stage II carcinoma of the left breast.   OPERATION PERFORMED:  Insertion of Port-A-Cath.   SURGEON:  Rose Phi. Maple Hudson, M.D.   ANESTHESIA:  MAC.   DESCRIPTION OF PROCEDURE:  The patient was placed on the operating table  with arms down by the side and a roll between the shoulder blades and then  the right upper chest and neck prepped and draped in the usual fashion.  Under local anesthesia, a right subclavian puncture was carried out without  difficulty with insertion of the guidewire and proper positioning of the  guidewire confirmed by fluoroscopy.  Then a transverse incision was made on  the anterior chest wall and a pocket developed for the implantable port.  We  then tunneled between the subclavian puncture site and the new pocket and  passed the catheter through that.  I then attached it to the X-Port system  and the system was thoroughly flushed.  We anchored the port in the pocket  with two 2-0 Prolene sutures.  The catheter tip was then cut to go to the  fourth interspace.  We then passed the dilator and peel-away sheath over the  wire and then removed the wire and the dilator and passed the catheter  through the peel-away sheath and then removed it.  Proper positioning of the  system was then confirmed by fluoroscopy with no kinking and the catheter  tip was in the vena cava.  The system was then flushed, aspirated and then  fully heparinized.   The incisions were closed with 3-0 Vicryl and  subcuticular 4-0 Monocryl and  Steri-Strips.  Dressings applied.  The patient transferred to the recovery  room in satisfactory condition having tolerated the procedure well.                                                Rose Phi. Maple Hudson, M.D.    PRY/MEDQ  D:  05/22/2002  T:  05/22/2002  Job:  [id number]   cc:   Pierce Crane, M.D.  [address] [facility]  [place]  [place] [address]  Fax: [phone]

## 2010-07-23 NOTE — H&P (Signed)
NAME:  Alyssa Jackson, Alyssa Jackson                         ACCOUNT NO.:  [id number]   MEDICAL RECORD NO.:  [id number]                   PATIENT TYPE:  EMS   LOCATION:  ED                                   FACILITY:  [facility]   PHYSICIAN:  Enzo Montgomery. Filbert Berthold, M.D.               DATE OF BIRTH:  [birth year]   DATE OF ADMISSION:  07/11/2002  DATE OF DISCHARGE:                                HISTORY & PHYSICAL   IDENTIFYING DATA:  A 65 year old female with a history of breast cancer.   REASON FOR ADMISSION:  Fevers, chills, rigors, status post chemotherapy.   ONCOLOGIC HISTORY:  She was discovered to have a breast mass in February and  underwent lumpectomy and seven lymph node dissection with the finding of a  micrometastasis.  She is currently undergoing AC chemotherapy and is now two  weeks status post cycle #2.  She was neutropenic following cycle #1 at about  two weeks.  Of note, one week ago she was neutropenic.  She had some  symptoms of thrush, as well as vaginitis, and was placed on Diflucan and  finishes the Diflucan tomorrow.  She had a temperature at home of 101.8  after having chills all day.  She has no other localizing symptoms such as  nausea, vomiting, diarrhea, abdominal pain, cough, sputum production,  headache, or other worrisome symptoms.   PAST MEDICAL HISTORY:  1. Breast cancer.  2. Status post hysterectomy for fibroids.  3. Depression.  4. Asthma life long.   MEDICATIONS:  1. Albuterol.  2. Serevent.  3. Effexor 150 mg daily.   ALLERGIES:  1. PENICILLIN caused throat tightening when she was 65 years old.  2. SULFA causes rash.   HABITS:  No tobacco or alcohol.   SOCIAL HISTORY:  She is married and has children.  Her husband is disabled.  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   FAMILY HISTORY:  No malignancies.   PHYSICAL EXAMINATION:  VITAL SIGNS:  T-max at home 101.8, T-current is  101.3.  Her other vitals are unremarkable.  GENERAL:  She has shaking  chills here in the emergency room.  HEAD AND NECK:  No oral lesions, no thrush, no palpable lymph nodes.  LUNGS:  Clear.  No vertebral tenderness.  HEART:  Normal S1 and S2.  Port site looks okay and is nontender.  ABDOMEN:  Soft with only minimal tenderness.  No peripheral edema.  NEUROLOGIC:  Nonfocal.   LABORATORY DATA:  No laboratory data as of yet.   IMPRESSION:  She looks as though she may have a bacterial infection.  I do  not know her counts yet, but she is two weeks post chemotherapy, and two  weeks after her first cycle she was neutropenic, so I suspect that she may  be neutropenic now.  In either event, I think she needs to be admitted for  IV antibiotics,  and given her allergy history, I will put her on both  Levaquin and vancomycin.  I will give her Neupogen empirically, as well.  Checking her labs today, as well as tomorrow morning.  I suspect that she  will do well and go home soon once her ANC normalizes.  Will panculture her  and await that data, as well.   She is full code.                                               Robert C. Filbert Berthold, M.D.    RCW/MEDQ  D:  07/11/2002  T:  07/11/2002  Job:  [id number]   cc:   Pierce Crane, M.D.  [address] - [facility]  [place]  [place] [address]  Fax: [phone]   Rose Phi. Young, M.D.  [address]  [place]  [place] [address]  Fax: [phone]

## 2010-07-23 NOTE — Op Note (Signed)
NAME:  Alyssa Jackson, Alyssa Jackson                         ACCOUNT NO.:  [id number]   MEDICAL RECORD NO.:  [id number]                   PATIENT TYPE:  AMB   LOCATION:  DSC                                  FACILITY:  [facility]   PHYSICIAN:  Rose Phi. Maple Hudson, M.D.                DATE OF BIRTH:  [birth year]   DATE OF PROCEDURE:  04/24/2002  DATE OF DISCHARGE:                                 OPERATIVE REPORT   PREOPERATIVE DIAGNOSIS:  Stage I carcinoma of the left breast.   POSTOPERATIVE DIAGNOSIS:  Stage I carcinoma of the left breast.   PROCEDURES:  1. Blue dye injection.  2. Left sentinel lymph node biopsy.  3. Left partial mastectomy.   SURGEON:  Rose Phi. Maple Hudson, M.D.   ANESTHESIA:  General.   DESCRIPTION OF PROCEDURE:  Prior to coming to the operating room, 1 mCi of  Technetium sulfur colloid was injected intradermally.  After suitable  general anesthesia was induced, the patient was placed in the supine  position with the left arm extended on the arm board.  Lymphazurin blue 5 mL  was injected in the subareolar tissue and the breast massaged for about  three minutes.  We then prepped and draped the breast, shoulder, and axilla  in the standard fashion.   Using the Neoprobe, we scanned the internal mammary area, the  supraclavicular area, and the left axilla.  The only hot spot was in the  left axilla.  A short transverse incision was made in the left axilla with  dissection through the subcutaneous tissue to the clavipectoral fascia.  Just deep to the clavipectoral fascia was an enlarged blue and quite hot  lymph node with counts in excess of 3000.  I excised that lymph node,  clipping the afferent and efferent lymphatics.  There were no residual hot  spots, blue nodes, or palpable nodes.  The node was submitted to the  pathologist for touch prep for metastatic disease.   While that was being done the palpable mass at the 11:30 position of the  left breast was outlined and then an incision  was made taking a wedge of  skin overlying the palpable mass, and a wide excision was carried out using  the cautery.  The specimen was oriented for the pathologist and submitted  for touch prep for margins.   Touch preps of both the sentinel node and the margins were clean.   Incisions were then closed with 3-0 Vicryl and subcuticular 4-0 Monocryl and  Steri-Strips.  Dressings applied.  The patient transferred to the recovery  room in satisfactory condition, having tolerated the procedure well.                                               Rose Phi. Young,  M.D.    PRY/MEDQ  D:  04/24/2002  T:  04/24/2002  Job:  [id number]   cc:   Andres Ege, M.D.  [address]  [place]  [place] [address]  Fax: [phone]

## 2010-07-23 NOTE — Op Note (Signed)
NAME:  Alyssa Jackson, Alyssa Jackson                         ACCOUNT NO.:  [id number]   MEDICAL RECORD NO.:  [id number]                   PATIENT TYPE:  OBV   LOCATION:  9312                                 FACILITY:  [facility]   PHYSICIAN:  Laqueta Linden, M.D.                 DATE OF BIRTH:  Aug 23, 1945   DATE OF PROCEDURE:  02/19/2002  DATE OF DISCHARGE:                                 OPERATIVE REPORT   PREOPERATIVE DIAGNOSIS:  Recurrent endometrial polyps.   POSTOPERATIVE DIAGNOSES:  1. Recurrent endometrial polyps.  2. Intramural and serosal fibroids.   PROCEDURE:  Transvaginal hysterectomy with bilateral salpingo-oophorectomy.   SURGEON:  Laqueta Linden, M.D.   ASSISTANT:  Andres Ege, M.D.   ANESTHESIA:  Spinal/epidural per Dr. Arby Barrette.   ESTIMATED BLOOD LOSS:  Less than 50 cc.   FLUIDS:  2 liters of crystalloid.   URINE OUTPUT:  150 cc in and out catheterization at the conclusion of the  procedure.   COUNTS:  Correct x2.   COMPLICATIONS:  None.   INDICATIONS:  The patient is a 65 year old gravida 3, para 3, married white  female with her third recurrence of postmenopausal bleeding with a large  endometrial polyp.  She has undergone hysteroscopic resection x2 in the  past.  She was given alternatives of repeat resection with ablation versus  vaginal hysterectomy.  She elected definitive surgical management.  She has  been extensively counseled as to the risks, benefits, and alternatives and  complications of the procedure including but not limited to anesthesia  risks, infection, bleeding possibly requiring transfusion, injury to bowel,  bladder, ureters, vessels, nerves, possibility of fistula formation,  possible need for laparotomy, possible need for subsequent incontinence or  pelvic relaxation procedure as well as other surgical risks including DVT,  PE, pneumonia, death and other unknown risks.  She has seen the informed  consent film with her husband and has voiced  her understanding of the risks,  and had her questions answered and presents now for definitive surgery.   DESCRIPTION OF PROCEDURE:  The patient was taken to the operating room.  After proper identification, consents were ascertained.  She was placed on  the operating table in the supine position.  She was then was placed in the  seated position and a spinal/epidural was placed by Dr. Arby Barrette and dosed.  When the patient had achieved an appropriate level she was placed in the  candy cane stirrups, and the perineum and vagina were prepped and draped in  the routine sterile fashion.  As per the patient's request, a tiny nodule on  her left buttock was excised prior to beginning the hysterectomy.  This was  sent to pathology.  The base of this was cauterized.  At the end of the  procedure, a Band-Aid with Neosporin ointment was placed.  Attention was  then turned to the hysterectomy.  The patient's  bladder was emptied with a  Latex-free catheter prior to initiating the procedure.  The cervix was  grasped with a single-tooth tenaculum and was noted to have a minimal amount  of descent.  The portio was injected with a 1:100,000 solution of  epinephrine circumferentially.  The cervix was then circumscribed with the  scalpel.  The posterior vaginal mucosa was then pushed off the cervix, and  the cul-de-sac entered sharply using curved Mayo scissors without obvious  injury or injury to surrounding structures apparent.  The long retractor was  placed through the cul-de-sac opening.  The uterosacral ligaments were  clamped, cut and ligated with 0 Vicryl suture and tagged.  Several  additional small pedicles were taken with gradual advancement of the uterus  into the operative field with better access to the procedure.  All pedicles  were suture ligated with 0 Vicryl.  The cardinal ligaments were clamped, cut  and suture ligated.  The anterior peritoneal reflexion was easily identified  and entered  sharply without obvious injury or entry into the bladder.  A  retractor was placed anteriorly to retract the bladder out of the operative  field.  At this point, uterine vessels were clamped incorporating both the  anterior and posterior peritoneum and pedicles cut and suture ligated.  The  uterine fundus was noted to be disordered by several serosal and intramural  fibroids.  This was grasped with a towel clip and advanced through the cul-  de-sac with access to the adnexal region.  Curved Heaney clamps were placed  across the proximal round ligament, utero-ovarian ligament and tube  bilaterally with excision of the specimen.  The adnexal pedicles were doubly  ligated.  Both tubes and ovaries appeared normal and menopausal in  appearance with tiny ovaries.  As per the patient's request and since they  were accessible, the both tubes and ovaries were then removed.  The tube and  ovary on the right were grasped with a Babcock and a curved Heaney clamp  placed across the infundibulopelvic ligament just distal to the ovary.  The  tube and ovary were excised.  The pedicle was then triply ligated with two  free ties and a stitch of 0 Vicryl.  Hemostasis was excellent.  An identical  procedure was performed on the opposite side with removal of the opposite  tube and ovary.  These pedicles were released in the peritoneal cavity.  There was a slight amount of bleeding from the posterior vaginal cuff.  This  was closed to the posterior peritoneum using a running locked stitch of 0  Vicryl.  Hemostasis was markedly improved at this point.  The parietal  peritoneum was then closed in a pursestring fashion.  Counts were correct  prior to closure of the peritoneum.  The uterosacral ligaments were then  tied in the midline.  The vaginal cuff was then closed from side-to-side  using figure-of-eight sutures of 0 Vicryl.  Hemostasis was noted to be excellent.  The patient was returned from Trendelenburg to  the flat position  and the bladder was catheterized at the conclusion of the procedure of 150  cc of clear urine which was attenuated in the 55 minute procedure.  The  catheter was not left in place.  The patient was returned to the supine  position and was stable and awake on transfer to the PACU.  She will be  hospitalized for overnight observation with anticipated discharge in the  morning barring any complications.  Estimated blood  loss was less than 50  cc.  Fluids 2 liters of crystalloid.  Urine output 150 cc.  Counts were  correct x2.  Complications none.                                               Laqueta Linden, M.D.    LKS/MEDQ  D:  02/19/2002  T:  02/19/2002  Job:  [id number]   cc:   C. Duane Lope, M.D.  [address]  [place]  [place] [address]  Fax: [phone]

## 2010-07-23 NOTE — Discharge Summary (Signed)
NAME:  Alyssa Jackson, Alyssa Jackson                         ACCOUNT NO.:  [id number]   MEDICAL RECORD NO.:  [id number]                   PATIENT TYPE:  INP   LOCATION:  0258                                 FACILITY:  [facility]   PHYSICIAN:  Rose Phi. Myna Hidalgo, M.D.              DATE OF BIRTH:  Oct 28, 1945   DATE OF ADMISSION:  07/11/2002  DATE OF DISCHARGE:  07/14/2002                                 DISCHARGE SUMMARY   DIAGNOSES UPON DISCHARGE:  1. Fever of unknown etiology.  2. Stage II infiltrating ductal carcinoma of the right breast.  3. Asthma.  4. Depression.   CONDITION UPON DISCHARGE:  Stable.   ACTIVITY:  As tolerated.   DIET:  No restrictions.   FOLLOW-UP:  The patient will have follow-up chemotherapy on Jul 17, 2002.   MEDICATIONS ON DISCHARGE:  1. Albuterol inhaler 2 puffs b.i.d.  2. Effexor 150 p.o. daily.  3. Singulair 10 mg daily.  4. Synthroid 0.125 mg daily.  5. Flovent 1-2 puffs daily.  6. Nexium 40 mg q.h.s.  7. Coumadin 1 mg daily.   HOSPITAL COURSE:  The patient was admitted.  She had a temperature of 101.3.  On admission she was neutropenic.  Her total white cell count was 2.3, but  her neutrophils were 300.  She had cultures taken.  All cultures were  negative.  A chest x-ray was done which were negative.  She had no diarrhea.  She denied any focal symptoms.  She did complain of some pain in the back  and had taken a fall down some stairs.   She defervesced well in the hospital.  She had mass improvement of her white  cells.  On the day of discharge her white cell count was 7.6, hemoglobin  10.9, hematocrit 32.1, platelet count was 314.   PHYSICAL EXAMINATION UPON DISCHARGE:  VITAL SIGNS:  Temperature 97.6, pulse  91, respiratory rate 20, blood pressure 137/75.  HEENT, NECK:  Showed no mucositis.  There is no adenopathy in the neck.  LUNGS:  Clear bilaterally.  CARDIAC:  Regular rate and rhythm.  With no murmurs, rubs, or bruits.  ABDOMEN:  Soft.  Good bowel  sounds.  There was no palpable abdominal mass.  There was no palpable  hepatosplenomegaly.  EXTREMITIES:  Shows no clubbing, cyanosis, or edema.  NEUROLOGIC:  No focal neurological deficits.                                               Rose Phi. Myna Hidalgo, M.D.    PRE/MEDQ  D:  07/14/2002  T:  07/14/2002  Job:  [id number]   cc:   Pierce Crane, M.D.  [address] [facility]  [place]  [place] [address]  Fax: [phone]   Miguel Aschoff, M.D.  [phone]  [address], [address]  [place]  [place]  [address]  Fax: [phone]

## 2010-07-23 NOTE — Op Note (Signed)
NAME:  Alyssa Jackson, Alyssa Jackson                         ACCOUNT NO.:  [id number]   MEDICAL RECORD NO.:  [id number]                   PATIENT TYPE:  OUT   LOCATION:  XRAY                                 FACILITY:  [facility]   PHYSICIAN:  Rose Phi. Maple Hudson, M.D.                DATE OF BIRTH:  1945/08/18   DATE OF PROCEDURE:  03/04/2003  DATE OF DISCHARGE:  02/26/2003                                 OPERATIVE REPORT   PREOPERATIVE DIAGNOSIS:  Cancer of the breast.   POSTOPERATIVE DIAGNOSIS:  Cancer of the breast.   OPERATION PERFORMED:  Removal of Port-A-Cath.   SURGEON:  Rose Phi. Maple Hudson, M.D.   ANESTHESIA:  Local.   DESCRIPTION OF PROCEDURE:  The patient was placed on the operating table and  the Port-A-Cath area on the right upper chest prepped and draped in the  usual fashion.  It was infiltrated with 1% Xylocaine with Adrenalin.  Incision was made and we exposed the port and grasped the catheter and  removed it from the vein and then, using traction, divided the two sutures  holding the port in place and then removed them.  There was no bleeding.  Subcutaneous closure with 4-0 Monocryl and Steri-Strips carried out.  Dressing applied.  The patient was then allowed to go home.                                               Rose Phi. Maple Hudson, M.D.    PRY/MEDQ  D:  03/04/2003  T:  03/04/2003  Job:  [id number]

## 2010-07-28 ENCOUNTER — Encounter (INDEPENDENT_AMBULATORY_CARE_PROVIDER_SITE_OTHER): Payer: Self-pay | Admitting: General Surgery

## 2010-07-30 ENCOUNTER — Other Ambulatory Visit (INDEPENDENT_AMBULATORY_CARE_PROVIDER_SITE_OTHER): Payer: Self-pay | Admitting: General Surgery

## 2010-07-30 DIAGNOSIS — C50912 Malignant neoplasm of unspecified site of left female breast: Secondary | ICD-10-CM

## 2010-08-03 ENCOUNTER — Encounter (HOSPITAL_COMMUNITY)
Admission: RE | Admit: 2010-08-03 | Discharge: 2010-08-03 | Disposition: A | Discharge status: Home / Self Care | Payer: Medicare Other | Source: Ambulatory Visit | Attending: General Surgery | Admitting: General Surgery

## 2010-08-03 ENCOUNTER — Other Ambulatory Visit (INDEPENDENT_AMBULATORY_CARE_PROVIDER_SITE_OTHER): Payer: Self-pay | Admitting: General Surgery

## 2010-08-03 ENCOUNTER — Ambulatory Visit (HOSPITAL_COMMUNITY)
Admission: RE | Admit: 2010-08-03 | Discharge: 2010-08-03 | Disposition: A | Discharge status: Home / Self Care | Payer: Medicare Other | Source: Ambulatory Visit | Attending: General Surgery | Admitting: General Surgery

## 2010-08-03 DIAGNOSIS — Z01818 Encounter for other preprocedural examination: Secondary | ICD-10-CM | POA: Insufficient documentation

## 2010-08-03 DIAGNOSIS — C50919 Malignant neoplasm of unspecified site of unspecified female breast: Secondary | ICD-10-CM | POA: Insufficient documentation

## 2010-08-03 DIAGNOSIS — Z01812 Encounter for preprocedural laboratory examination: Secondary | ICD-10-CM | POA: Insufficient documentation

## 2010-08-03 DIAGNOSIS — C50912 Malignant neoplasm of unspecified site of left female breast: Secondary | ICD-10-CM

## 2010-08-03 LAB — CBC
HCT: 41 % (ref 36.0–46.0)
Hemoglobin: 14.1 g/dL (ref 12.0–15.0)
RBC: 5.11 MIL/uL (ref 3.87–5.11)

## 2010-08-03 LAB — BASIC METABOLIC PANEL
CO2: 26 mEq/L (ref 19–32)
Chloride: 101 mEq/L (ref 96–112)
GFR calc Af Amer: >60 mL/min (ref >60)
Glucose, Bld: 136 mg/dL — ABNORMAL HIGH (ref 70–99)
Potassium: 4.7 mEq/L (ref 3.5–5.1)
Sodium: 139 mEq/L (ref 135–145)

## 2010-08-03 LAB — HEPATIC FUNCTION PANEL
ALT: 42 U/L — ABNORMAL HIGH (ref 0–35)
AST: 39 U/L — ABNORMAL HIGH (ref 0–37)
Albumin: 3.7 g/dL (ref 3.5–5.2)
Indirect Bilirubin: 0.4 mg/dL (ref 0.3–0.9)
Total Protein: 7 g/dL (ref 6.0–8.3)

## 2010-08-03 LAB — DIFFERENTIAL
Basophils Relative: 0 % (ref 0–1)
Eosinophils Relative: 2 % (ref 0–5)
Lymphocytes Relative: 56 % — ABNORMAL HIGH (ref 12–46)
Monocytes Absolute: 0.7 10*3/uL (ref 0.1–1.0)
Monocytes Relative: 13 % — ABNORMAL HIGH (ref 3–12)
Neutrophils Relative %: 29 % — ABNORMAL LOW (ref 43–77)

## 2010-08-03 LAB — SURGICAL PCR SCREEN: MRSA, PCR: NEGATIVE

## 2010-08-04 ENCOUNTER — Other Ambulatory Visit (INDEPENDENT_AMBULATORY_CARE_PROVIDER_SITE_OTHER): Payer: Self-pay | Admitting: General Surgery

## 2010-08-04 ENCOUNTER — Ambulatory Visit (HOSPITAL_COMMUNITY)
Admission: RE | Admit: 2010-08-04 | Discharge: 2010-08-04 | Disposition: A | Discharge status: Home / Self Care | Payer: Medicare Other | Source: Ambulatory Visit | Attending: General Surgery | Admitting: General Surgery

## 2010-08-04 ENCOUNTER — Ambulatory Visit (HOSPITAL_COMMUNITY)
Admission: RE | Admit: 2010-08-04 | Discharge: 2010-08-05 | Disposition: A | Discharge status: Home / Self Care | Payer: Medicare Other | Source: Ambulatory Visit | Attending: General Surgery | Admitting: General Surgery

## 2010-08-04 ENCOUNTER — Ambulatory Visit (HOSPITAL_COMMUNITY): Payer: Medicare Other

## 2010-08-04 DIAGNOSIS — C50919 Malignant neoplasm of unspecified site of unspecified female breast: Secondary | ICD-10-CM | POA: Insufficient documentation

## 2010-08-04 DIAGNOSIS — C50912 Malignant neoplasm of unspecified site of left female breast: Secondary | ICD-10-CM

## 2010-08-04 DIAGNOSIS — Z01818 Encounter for other preprocedural examination: Secondary | ICD-10-CM | POA: Insufficient documentation

## 2010-08-04 HISTORY — PX: MASTECTOMY: SHX3

## 2010-08-04 HISTORY — PX: BREAST SURGERY: SHX581

## 2010-08-04 LAB — GLUCOSE, CAPILLARY: Glucose-Capillary: 171 mg/dL — ABNORMAL HIGH (ref 70–99)

## 2010-08-04 MED ORDER — TECHNETIUM TC 99M SULFUR COLLOID FILTERED
1.0000 | Freq: Once | INTRAVENOUS | Status: AC | PRN
  Administered 2010-08-04: 1 via INTRADERMAL

## 2010-08-05 LAB — GLUCOSE, CAPILLARY
Glucose-Capillary: 125 mg/dL — ABNORMAL HIGH (ref 70–99)
Glucose-Capillary: 142 mg/dL — ABNORMAL HIGH (ref 70–99)

## 2010-08-06 NOTE — Op Note (Signed)
NAME:  Alyssa Jackson, Alyssa Jackson               ACCOUNT NO.:  [id number]  MEDICAL RECORD NO.:  [id number]           PATIENT TYPE:  I  LOCATION:  5118                         FACILITY:  [facility]  PHYSICIAN:  Ollen Gross. Vernell Morgans, M.D. DATE OF BIRTH:  18-Jul-1945  DATE OF PROCEDURE:  08/04/2010 DATE OF DISCHARGE:  08/05/2010                              OPERATIVE REPORT   PREOPERATIVE DIAGNOSIS:  Recurrent left breast cancer.  POSTOPERATIVE DIAGNOSIS:  Recurrent left breast cancer.  PROCEDURES:  Placement of a Port-A-Cath, left mastectomy with sentinel node biopsy x1 and injection of blue dye.  SURGEON:  Ollen Gross. Vernell Morgans, MD.  ANESTHESIA:  General endotracheal.  PROCEDURE:  After informed consent was obtained, the patient was brought to the operating room and placed in a supine position on the operating table.  After adequate induction of general anesthesia, a roll was placed between the patient's shoulders to extend the shoulders slightly. Her chest area bilaterally, breast area, and neck area were all prepped with ChloraPrep and allowed to dry and draped in usual sterile manner. Attention was first turned to the right chest area.  A small stab incision was made with a #15 blade knife just lateral to the bend of the clavicle.  The patient was placed in Trendelenburg position.  Using a large-bore finder needle, we attempted to slide beneath the bend of the clavicle aiming towards the sternal notch to try to find the right subclavian vein.  We were only able to find the right subclavian artery. We removed the needle, held pressure for several minutes.  At this point, we then moved up to the neck.  Using a 22-gauge needle, we were able to find the right internal jugular vein.  We then used that needle as a landmark and we were able to access the right internal jugular vein with a large-bore finder needle without difficulty.  A wire was fed through the needle without difficulty using the Seldinger  technique. The wire was confirmed in the central venous system using real-time fluoroscopy.  At this point, we then extended the incision on the right chest wall with a #15 blade knife.  We created a subcutaneous pocket with blunt finger dissection and some sharp dissection with the electrocautery.  The reservoir was placed in the pocket.  The tubing was attached to the reservoir.  Using a hemostat and tunneler, we were able to tunnel between the chest wall and the neck incision.  We then brought the tubing through the subcutaneous tissue up to the neck.  We then placed a sheath and dilator over the wire also using the Seldinger technique without difficulty.  The dilator and wire were removed.  The tubing was fed through the sheath as far as it could be fed and held in place while the sheath was gently cracked and separated keeping the tubing in place.  The length of the tubing was estimated using real-time fluoroscopy.  Once this was accomplished, the fluoro image showed the tip of the catheter to be in the superior vena cava and the position looked good.  The port aspirated blood easily.  We  then flushed it with a dilute heparin solution and with a more concentrated heparin solution. The anchor was used to attach the tubing to the reservoir.  The reservoir was anchored in the pocket with two 2-0 Prolene stitches without difficulty.  The chest incision was closed with a deep layer of interrupted 3-0 Vicryl stitches and the skin was closed with a running 4- 0 Monocryl subcuticular stitch.  The neck incision was closed with a second layer of interrupted 4-0 subcuticular stitch.  Dermabond dressings were applied.  The patient tolerated the procedure well.  At this point, the attention was turned to the left breast.  An elliptical incision around the nipple-areolar complex was mapped out so as to minimize excess skin and incorporate her old scar.  This incision was made using a #10 blade  knife along the mapped outlines.  The incision was carried down through the skin and subcutaneous tissue sharply with the electrocautery.  Using a harmonic scalpel blade, we were then able to create a skin flap between the breast tissue and the subcutaneous fat.  This was done circumferentially while elevating the skin flaps towards saline with skin hooks until the dissection met the chest wall. Blue dye was injected into the subareolar tissue.  A 2 mL of methylene blue and 2 mL of injectable saline were injected in subareolar position before the case was started and 1 mL of technetium sulfur colloid was also injected in subareolar position for the lymph node biopsy. Laterally, the dissection was carried down to the latissimus muscle. Once we were lateral into the axilla, we were able to identify a hot spot.  We identified a hot lymph node.  This was excised sharply with the electrocautery and sent for touch preps which were negative.  There was some other potential lymph node tissue that felt abnormal was also removed in the same area.  Since the touch preps and the node were negative and no other palpable blue or hot lymph nodes were identified, we then finished the mastectomy by removing the breast from the pectoralis muscle with the pectoralis fascia.  This was done sharply with the electrocautery.  Once the breast was removed, it was oriented with a stitch on the lateral aspect and sent to pathology for further evaluation.  Two separate stab incisions were made in the anterior axillary line inferior to the wound with a #15 blade knife.  Tonsil was placed through these openings into the operative bed and used to bring 19-French round Blake drains into the operative bed.  The lateral drain was placed in the axilla with medial drain was placed along the chest wall.  Hemostasis was achieved using the Bovie electrocautery.  Wound was irrigated with copious amounts of saline.  The  subcutaneous tissue was grossly reapproximated with interrupted 3-0 Vicryl stitches and skin was closed with running 4-0 Monocryl subcuticular stitch.  Dermabond dressings were applied.  The patient tolerated the procedure well.  At the end of the case, all needle, sponge, instrument counts were correct. The patient was then awakened, taken to recovery in stable condition.     Ollen Gross. Vernell Morgans, M.D.     PST/MEDQ  D:  08/05/2010  T:  08/05/2010  Job:  [id number]  Electronically Signed by Chevis Pretty III M.D. on 08/06/2010 09:13:55 AM

## 2010-08-12 NOTE — Discharge Summary (Signed)
  NAMEELISHEVA, Alyssa Jackson               ACCOUNT NO.:  [id number]  MEDICAL RECORD NO.:  [id number]          PATIENT TYPE:  OBV  LOCATION:  1404                         FACILITY:  [facility]  PHYSICIAN:  Martina Sinner, MD DATE OF BIRTH:  1945-04-15  DATE OF ADMISSION:  04/02/2010 DATE OF DISCHARGE:  04/03/2010                              DISCHARGE SUMMARY   [name] was admitted with vaginal bleeding post retropubic sling by Dr. Isabel Caprice.  She was observed overnight with vaginal packing.  The next day her vitals were normal.  Her pain was minimal.  Pack was removed. She had minimal bleeding.  All vitals were normal and laboratories test within normal limits.  She was sent home to follow up with Dr. Isabel Caprice.  She was also sent home on pain medication, antibiotic therapy.          ______________________________ Martina Sinner, MD     SAM/MEDQ  D:  08/02/2010  T:  08/02/2010  Job:  [id number]  Electronically Signed by Alfredo Martinez MD on 08/12/2010 11:20:06 AM

## 2010-08-13 ENCOUNTER — Encounter (HOSPITAL_BASED_OUTPATIENT_CLINIC_OR_DEPARTMENT_OTHER): Payer: Medicare Other | Admitting: Oncology

## 2010-08-31 ENCOUNTER — Ambulatory Visit (INDEPENDENT_AMBULATORY_CARE_PROVIDER_SITE_OTHER): Payer: Medicare Other | Admitting: General Surgery

## 2010-08-31 ENCOUNTER — Encounter (INDEPENDENT_AMBULATORY_CARE_PROVIDER_SITE_OTHER): Payer: Self-pay | Admitting: General Surgery

## 2010-08-31 VITALS — BP 130/84 | HR 100 | Temp 97.6°F | Resp 18 | Wt 140.0 lb

## 2010-08-31 DIAGNOSIS — IMO0002 Reserved for concepts with insufficient information to code with codable children: Secondary | ICD-10-CM

## 2010-08-31 DIAGNOSIS — S20119A Abrasion of breast, unspecified breast, initial encounter: Secondary | ICD-10-CM

## 2010-08-31 NOTE — Progress Notes (Signed)
Subjective:     Patient ID: Alyssa Jackson, female   DOB: Apr 12, 1945, 65 y.o.   MRN: [id number]    BP 130/84  Pulse 100  Temp 97.6 F (36.4 C)  Resp 18  Wt 140 lb (63.504 kg)    HPI Postoperative is a 65 year old white female who is now a couple  weeks out from a mastectomyon the left. Her postoperative course has been complicated by seroma.  Review of Systems     Objective:   Physical Exam On physical exam her left mastectomy incision is healed well. She does have a seroma beneath the skin. We were able to aspirate approximately 130 cc of seroma fluid out. She tolerated this well.    Assessment:     Postoperative seroma.    Plan:     We will plan to take her back to the operating room on the morning of July 2 to place a drain in the seroma cavity. Risks and benefits of surgery were discussed with the patient and she understands and wishes to proceed.

## 2010-08-31 NOTE — Patient Instructions (Signed)
Plan for surgery July 2

## 2010-09-01 ENCOUNTER — Ambulatory Visit (HOSPITAL_COMMUNITY)
Admission: RE | Admit: 2010-09-01 | Discharge: 2010-09-01 | Disposition: A | Discharge status: Home / Self Care | Payer: Medicare Other | Source: Ambulatory Visit | Attending: Oncology | Admitting: Oncology

## 2010-09-01 DIAGNOSIS — E119 Type 2 diabetes mellitus without complications: Secondary | ICD-10-CM | POA: Insufficient documentation

## 2010-09-01 DIAGNOSIS — C50919 Malignant neoplasm of unspecified site of unspecified female breast: Secondary | ICD-10-CM | POA: Insufficient documentation

## 2010-09-01 DIAGNOSIS — I10 Essential (primary) hypertension: Secondary | ICD-10-CM | POA: Insufficient documentation

## 2010-09-01 DIAGNOSIS — J45909 Unspecified asthma, uncomplicated: Secondary | ICD-10-CM | POA: Insufficient documentation

## 2010-09-01 DIAGNOSIS — E785 Hyperlipidemia, unspecified: Secondary | ICD-10-CM | POA: Insufficient documentation

## 2010-09-01 DIAGNOSIS — I519 Heart disease, unspecified: Secondary | ICD-10-CM

## 2010-09-02 ENCOUNTER — Encounter (HOSPITAL_COMMUNITY)
Admission: RE | Admit: 2010-09-02 | Discharge: 2010-09-02 | Disposition: A | Discharge status: Home / Self Care | Payer: Medicare Other | Source: Ambulatory Visit | Attending: General Surgery | Admitting: General Surgery

## 2010-09-02 ENCOUNTER — Other Ambulatory Visit (HOSPITAL_COMMUNITY): Payer: Medicare Other

## 2010-09-02 LAB — BASIC METABOLIC PANEL
BUN: 13 mg/dL (ref 6–23)
Calcium: 9 mg/dL (ref 8.4–10.5)
Chloride: 101 mEq/L (ref 96–112)
Creatinine, Ser: 0.85 mg/dL (ref 0.50–1.10)
GFR calc Af Amer: >60 mL/min (ref >60)

## 2010-09-02 LAB — CBC
HCT: 38.2 % (ref 36.0–46.0)
MCH: 27.7 pg (ref 26.0–34.0)
MCV: 81.4 fL (ref 78.0–100.0)
RDW: 15.3 % (ref 11.5–15.5)
WBC: 4.6 10*3/uL (ref 4.0–10.5)

## 2010-09-06 ENCOUNTER — Ambulatory Visit (HOSPITAL_COMMUNITY)
Admission: RE | Admit: 2010-09-06 | Discharge: 2010-09-06 | Disposition: A | Discharge status: Home / Self Care | Payer: Medicare Other | Source: Ambulatory Visit | Attending: General Surgery | Admitting: General Surgery

## 2010-09-06 DIAGNOSIS — Z01812 Encounter for preprocedural laboratory examination: Secondary | ICD-10-CM | POA: Insufficient documentation

## 2010-09-06 DIAGNOSIS — E119 Type 2 diabetes mellitus without complications: Secondary | ICD-10-CM | POA: Insufficient documentation

## 2010-09-06 DIAGNOSIS — IMO0002 Reserved for concepts with insufficient information to code with codable children: Secondary | ICD-10-CM | POA: Insufficient documentation

## 2010-09-06 DIAGNOSIS — I10 Essential (primary) hypertension: Secondary | ICD-10-CM | POA: Insufficient documentation

## 2010-09-06 DIAGNOSIS — Z9010 Acquired absence of unspecified breast and nipple: Secondary | ICD-10-CM | POA: Insufficient documentation

## 2010-09-06 DIAGNOSIS — Y836 Removal of other organ (partial) (total) as the cause of abnormal reaction of the patient, or of later complication, without mention of misadventure at the time of the procedure: Secondary | ICD-10-CM | POA: Insufficient documentation

## 2010-09-06 DIAGNOSIS — Z79899 Other long term (current) drug therapy: Secondary | ICD-10-CM | POA: Insufficient documentation

## 2010-09-06 DIAGNOSIS — Z853 Personal history of malignant neoplasm of breast: Secondary | ICD-10-CM | POA: Insufficient documentation

## 2010-09-06 HISTORY — PX: OTHER SURGICAL HISTORY: SHX169

## 2010-09-06 LAB — POTASSIUM: Potassium: 4 mEq/L (ref 3.5–5.1)

## 2010-09-06 LAB — GLUCOSE, CAPILLARY: Glucose-Capillary: 118 mg/dL — ABNORMAL HIGH (ref 70–99)

## 2010-09-07 NOTE — Op Note (Signed)
  Alyssa Jackson, Alyssa Jackson NO.:  [id number]  MEDICAL RECORD NO.:  [id number]  LOCATION:  SDSC                         FACILITY:  [facility]  PHYSICIAN:  Ollen Gross. Vernell Morgans, M.D. DATE OF BIRTH:  12-23-1945  DATE OF PROCEDURE:  09/06/2010 DATE OF DISCHARGE:  09/06/2010                              OPERATIVE REPORT   PREOPERATIVE DIAGNOSIS:  Persistent seroma after left mastectomy.  POSTOPERATIVE DIAGNOSIS:  Persistent seroma after left mastectomy.  PROCEDURE:  Placement of drain in the left chest wall seroma cavity.  SURGEON:  Ollen Gross. Vernell Morgans, MD  ANESTHESIA:  Local with IV sedation.  PROCEDURE IN DETAIL:  After informed consent was obtained, the patient was brought to the operating room and placed in supine position on the operating room table.  After adequate IV sedation had been given, the patient's left chest wall was prepped with ChloraPrep, allowed to dry, and draped in usual sterile manner.  A small incision was made just beneath her mastectomy incision in the sort of mid axillary line with a #15 blade knife.  This was done after infiltrating this area with 1% lidocaine.  Blunt dissection was carried through this little small incision until we entered the seroma cavity.  We then placed a 19-French round Blake drain in the seroma cavity along the inferior edge of the cavity.  The drain was palpated and known to be in the correct position. The drain was anchored to the skin with a 3-0 nylon stitch.  Sterile dressings were applied.  The patient tolerated the procedure well.  At the end of the case, all needle, sponge, and instrument counts were correct.  The patient was then awakened and taken to recovery in stable condition.     Ollen Gross. Vernell Morgans, M.D.     PST/MEDQ  D:  09/06/2010  T:  09/06/2010  Job:  [id number]  Electronically Signed by Chevis Pretty III M.D. on 09/07/2010 09:20:28 AM

## 2010-09-21 ENCOUNTER — Telehealth (INDEPENDENT_AMBULATORY_CARE_PROVIDER_SITE_OTHER): Payer: Self-pay

## 2010-09-24 ENCOUNTER — Ambulatory Visit (INDEPENDENT_AMBULATORY_CARE_PROVIDER_SITE_OTHER): Payer: Medicare Other | Admitting: Surgery

## 2010-09-24 ENCOUNTER — Encounter (INDEPENDENT_AMBULATORY_CARE_PROVIDER_SITE_OTHER): Payer: Self-pay | Admitting: Surgery

## 2010-09-24 DIAGNOSIS — IMO0002 Reserved for concepts with insufficient information to code with codable children: Secondary | ICD-10-CM

## 2010-09-24 NOTE — Progress Notes (Signed)
The patient returns to clinic today. She had a mastectomy at the end of May by Dr. Carolynne Edouard and has had a persistent seroma requiring a drain to be replaced. She has been in [place] for 3 weeks and presents today to have the drain removed. The drainage less than 10 cc a day.  Exam: Left breast wound is well healed. The drain was removed today.  Impression: Left breast seroma after mastectomy  Plan: Return to clinic in 2-3 weeks to see Dr. Carolynne Edouard. She can begin chemotherapy next week.

## 2010-09-24 NOTE — Patient Instructions (Signed)
Start chemo on tues.  Follow up in 2-3 weeks with Dr Carolynne Edouard.

## 2010-09-27 NOTE — Telephone Encounter (Signed)
Pt. Does not need to follow-up with Dr. Carolynne Edouard.

## 2010-09-28 ENCOUNTER — Other Ambulatory Visit: Payer: Self-pay | Admitting: Oncology

## 2010-09-28 ENCOUNTER — Encounter (HOSPITAL_BASED_OUTPATIENT_CLINIC_OR_DEPARTMENT_OTHER): Payer: Medicare Other | Admitting: Oncology

## 2010-09-28 DIAGNOSIS — Z170 Estrogen receptor positive status [ER+]: Secondary | ICD-10-CM

## 2010-09-28 DIAGNOSIS — Z853 Personal history of malignant neoplasm of breast: Secondary | ICD-10-CM

## 2010-09-28 DIAGNOSIS — C50219 Malignant neoplasm of upper-inner quadrant of unspecified female breast: Secondary | ICD-10-CM

## 2010-09-28 DIAGNOSIS — Z5111 Encounter for antineoplastic chemotherapy: Secondary | ICD-10-CM

## 2010-09-28 DIAGNOSIS — Z5112 Encounter for antineoplastic immunotherapy: Secondary | ICD-10-CM

## 2010-09-28 DIAGNOSIS — D72819 Decreased white blood cell count, unspecified: Secondary | ICD-10-CM

## 2010-09-28 DIAGNOSIS — Z171 Estrogen receptor negative status [ER-]: Secondary | ICD-10-CM

## 2010-09-28 LAB — CBC WITH DIFFERENTIAL/PLATELET
Basophils Absolute: 0 10*3/uL (ref 0.0–0.1)
Eosinophils Absolute: 0 10*3/uL (ref 0.0–0.5)
HGB: 12.8 g/dL (ref 11.6–15.9)
MCV: 81.8 fL (ref 79.5–101.0)
NEUT#: 5.6 10*3/uL (ref 1.5–6.5)
RDW: 14.6 % — ABNORMAL HIGH (ref 11.2–14.5)
lymph#: 2.9 10*3/uL (ref 0.9–3.3)

## 2010-09-28 LAB — COMPREHENSIVE METABOLIC PANEL
Albumin: 3.4 g/dL — ABNORMAL LOW (ref 3.5–5.2)
CO2: 20 mEq/L (ref 19–32)
Chloride: 97 mEq/L (ref 96–112)
Glucose, Bld: 232 mg/dL — ABNORMAL HIGH (ref 70–99)
Potassium: 4.4 mEq/L (ref 3.5–5.3)
Sodium: 133 mEq/L — ABNORMAL LOW (ref 135–145)
Total Protein: 7.1 g/dL (ref 6.0–8.3)

## 2010-09-29 ENCOUNTER — Encounter: Payer: Medicare Other | Admitting: Oncology

## 2010-09-29 LAB — BRAIN NATRIURETIC PEPTIDE: Brain Natriuretic Peptide: 101.2 pg/mL — ABNORMAL HIGH (ref 0.0–100.0)

## 2010-10-05 ENCOUNTER — Encounter (HOSPITAL_BASED_OUTPATIENT_CLINIC_OR_DEPARTMENT_OTHER): Payer: Medicare Other | Admitting: Oncology

## 2010-10-05 ENCOUNTER — Other Ambulatory Visit: Payer: Self-pay | Admitting: Physician Assistant

## 2010-10-05 ENCOUNTER — Other Ambulatory Visit: Payer: Self-pay | Admitting: Oncology

## 2010-10-05 DIAGNOSIS — D72819 Decreased white blood cell count, unspecified: Secondary | ICD-10-CM

## 2010-10-05 DIAGNOSIS — Z853 Personal history of malignant neoplasm of breast: Secondary | ICD-10-CM

## 2010-10-05 DIAGNOSIS — Z5111 Encounter for antineoplastic chemotherapy: Secondary | ICD-10-CM

## 2010-10-05 DIAGNOSIS — Z170 Estrogen receptor positive status [ER+]: Secondary | ICD-10-CM

## 2010-10-05 DIAGNOSIS — C50219 Malignant neoplasm of upper-inner quadrant of unspecified female breast: Secondary | ICD-10-CM

## 2010-10-05 LAB — CBC WITH DIFFERENTIAL/PLATELET
BASO%: 0.9 % (ref 0.0–2.0)
LYMPH%: 24.5 % (ref 14.0–49.7)
MCHC: 34.2 g/dL (ref 31.5–36.0)
MONO#: 0.6 10*3/uL (ref 0.1–0.9)
Platelets: 116 10*3/uL — ABNORMAL LOW (ref 145–400)
RBC: 4.52 10*6/uL (ref 3.70–5.45)
WBC: 8.2 10*3/uL (ref 3.9–10.3)
lymph#: 2 10*3/uL (ref 0.9–3.3)

## 2010-10-05 LAB — BASIC METABOLIC PANEL
CO2: 26 mEq/L (ref 19–32)
Calcium: 10.4 mg/dL (ref 8.4–10.5)
Sodium: 129 mEq/L — ABNORMAL LOW (ref 135–145)

## 2010-10-12 ENCOUNTER — Encounter (INDEPENDENT_AMBULATORY_CARE_PROVIDER_SITE_OTHER): Payer: Medicare Other | Admitting: General Surgery

## 2010-10-12 ENCOUNTER — Encounter (HOSPITAL_BASED_OUTPATIENT_CLINIC_OR_DEPARTMENT_OTHER): Payer: Medicare Other | Admitting: Oncology

## 2010-10-12 ENCOUNTER — Other Ambulatory Visit: Payer: Self-pay | Admitting: Oncology

## 2010-10-12 DIAGNOSIS — D72819 Decreased white blood cell count, unspecified: Secondary | ICD-10-CM

## 2010-10-12 DIAGNOSIS — C50219 Malignant neoplasm of upper-inner quadrant of unspecified female breast: Secondary | ICD-10-CM

## 2010-10-12 DIAGNOSIS — Z853 Personal history of malignant neoplasm of breast: Secondary | ICD-10-CM

## 2010-10-12 DIAGNOSIS — Z170 Estrogen receptor positive status [ER+]: Secondary | ICD-10-CM

## 2010-10-12 DIAGNOSIS — Z5112 Encounter for antineoplastic immunotherapy: Secondary | ICD-10-CM

## 2010-10-12 LAB — CBC WITH DIFFERENTIAL/PLATELET
Basophils Absolute: 0.1 10*3/uL (ref 0.0–0.1)
EOS%: 0.1 % (ref 0.0–7.0)
Eosinophils Absolute: 0 10*3/uL (ref 0.0–0.5)
HCT: 36.7 % (ref 34.8–46.6)
HGB: 12.3 g/dL (ref 11.6–15.9)
MCH: 28.3 pg (ref 25.1–34.0)
MONO#: 1.2 10*3/uL — ABNORMAL HIGH (ref 0.1–0.9)
NEUT#: 11.3 10*3/uL — ABNORMAL HIGH (ref 1.5–6.5)
NEUT%: 75.4 % (ref 38.4–76.8)
lymph#: 2.5 10*3/uL (ref 0.9–3.3)

## 2010-10-13 ENCOUNTER — Encounter (INDEPENDENT_AMBULATORY_CARE_PROVIDER_SITE_OTHER): Payer: Self-pay | Admitting: General Surgery

## 2010-10-14 ENCOUNTER — Other Ambulatory Visit (INDEPENDENT_AMBULATORY_CARE_PROVIDER_SITE_OTHER): Payer: Self-pay

## 2010-10-14 ENCOUNTER — Ambulatory Visit (INDEPENDENT_AMBULATORY_CARE_PROVIDER_SITE_OTHER): Payer: Medicare Other | Admitting: General Surgery

## 2010-10-14 ENCOUNTER — Encounter (INDEPENDENT_AMBULATORY_CARE_PROVIDER_SITE_OTHER): Payer: Self-pay | Admitting: General Surgery

## 2010-10-14 ENCOUNTER — Other Ambulatory Visit (INDEPENDENT_AMBULATORY_CARE_PROVIDER_SITE_OTHER): Payer: Self-pay | Admitting: General Surgery

## 2010-10-14 DIAGNOSIS — R1310 Dysphagia, unspecified: Secondary | ICD-10-CM

## 2010-10-14 DIAGNOSIS — C50912 Malignant neoplasm of unspecified site of left female breast: Secondary | ICD-10-CM | POA: Insufficient documentation

## 2010-10-14 DIAGNOSIS — C50919 Malignant neoplasm of unspecified site of unspecified female breast: Secondary | ICD-10-CM

## 2010-10-14 NOTE — Patient Instructions (Signed)
Refer to Dr. Randa Evens for swallowing problems

## 2010-10-15 ENCOUNTER — Encounter (HOSPITAL_COMMUNITY): Payer: Self-pay | Admitting: *Deleted

## 2010-10-18 ENCOUNTER — Ambulatory Visit (HOSPITAL_COMMUNITY)
Admission: RE | Admit: 2010-10-18 | Discharge: 2010-10-18 | Disposition: A | Discharge status: Home / Self Care | Payer: Medicare Other | Source: Ambulatory Visit | Attending: Internal Medicine | Admitting: Internal Medicine

## 2010-10-18 ENCOUNTER — Encounter (INDEPENDENT_AMBULATORY_CARE_PROVIDER_SITE_OTHER): Payer: Self-pay | Admitting: General Surgery

## 2010-10-18 VITALS — BP 150/73 | HR 92 | Ht 63.0 in | Wt 137.0 lb

## 2010-10-18 DIAGNOSIS — C50919 Malignant neoplasm of unspecified site of unspecified female breast: Secondary | ICD-10-CM | POA: Insufficient documentation

## 2010-10-18 NOTE — Patient Instructions (Signed)
Your physician has requested that you have an echocardiogram. Echocardiography is a painless test that uses sound waves to create images of your heart. It provides your doctor with information about the size and shape of your heart and how well your heart's chambers and valves are working. This procedure takes approximately one hour. There are no restrictions for this procedure.  This week at [facility].  Your physician has requested that you have a cardiac MRI. Cardiac MRI uses a computer to create images of your heart as its beating, producing both still and moving pictures of your heart and major blood vessels. For further information please visit [email or web address]. Please follow the instruction sheet given to you today for more information.  Alyssa Jackson will call you to schedule this appointment.  Your physician recommends that you schedule a follow-up appointment in: 1 month.

## 2010-10-18 NOTE — Progress Notes (Signed)
Subjective:     Patient ID: Alyssa Jackson, female   DOB: November 20, 1945, 65 y.o.   MRN: [id number]  HPI The patient is a 65 year old white female who is just over 2 months out now from a left mastectomy and sentinel lymph node biopsy for a T2 N0 left breast cancer. She is ER/PR negative HER-2 positive. Her postoperative course was complicated by a seroma which is now resolved. She has some soreness on her left chest wall.Her only other complaint is of some difficulty swallowing pills which is new.  Review of Systems     Objective:   Physical Exam On exam her left mastectomy incision has healed nicely. There is no evidence for infection or seroma. She has no palpable mass of her left chest wall.    Assessment:     2 months out from a left mastectomy and sentinel lymph node biopsy    Plan:     At this point she can begin returning to all her normal activities. She will continue to follow with her medical and radiation oncologist. We will plan to see her back in about 3 months to check on her progress.We will also plan to refer her to her gastroenterologist Dr. Randa Evens to evaluate her swallowing difficulties.

## 2010-10-19 ENCOUNTER — Other Ambulatory Visit: Payer: Self-pay | Admitting: Physician Assistant

## 2010-10-19 ENCOUNTER — Encounter (HOSPITAL_BASED_OUTPATIENT_CLINIC_OR_DEPARTMENT_OTHER): Payer: Medicare Other | Admitting: Oncology

## 2010-10-19 ENCOUNTER — Other Ambulatory Visit: Payer: Self-pay | Admitting: Oncology

## 2010-10-19 DIAGNOSIS — Z5111 Encounter for antineoplastic chemotherapy: Secondary | ICD-10-CM

## 2010-10-19 DIAGNOSIS — D72819 Decreased white blood cell count, unspecified: Secondary | ICD-10-CM

## 2010-10-19 DIAGNOSIS — Z853 Personal history of malignant neoplasm of breast: Secondary | ICD-10-CM

## 2010-10-19 DIAGNOSIS — C50219 Malignant neoplasm of upper-inner quadrant of unspecified female breast: Secondary | ICD-10-CM

## 2010-10-19 DIAGNOSIS — Z171 Estrogen receptor negative status [ER-]: Secondary | ICD-10-CM

## 2010-10-19 LAB — COMPREHENSIVE METABOLIC PANEL
CO2: 19 mEq/L (ref 19–32)
Calcium: 9.6 mg/dL (ref 8.4–10.5)
Chloride: 98 mEq/L (ref 96–112)
Creatinine, Ser: 0.67 mg/dL (ref 0.50–1.10)
Glucose, Bld: 241 mg/dL — ABNORMAL HIGH (ref 70–99)
Total Bilirubin: 0.3 mg/dL (ref 0.3–1.2)
Total Protein: 6.8 g/dL (ref 6.0–8.3)

## 2010-10-19 LAB — CBC WITH DIFFERENTIAL/PLATELET
Basophils Absolute: 0 10*3/uL (ref 0.0–0.1)
Eosinophils Absolute: 0 10*3/uL (ref 0.0–0.5)
HCT: 36.4 % (ref 34.8–46.6)
HGB: 12.1 g/dL (ref 11.6–15.9)
LYMPH%: 14.6 % (ref 14.0–49.7)
MCV: 84.3 fL (ref 79.5–101.0)
MONO%: 1.3 % (ref 0.0–14.0)
NEUT#: 9 10*3/uL — ABNORMAL HIGH (ref 1.5–6.5)
Platelets: 363 10*3/uL (ref 145–400)

## 2010-10-21 ENCOUNTER — Ambulatory Visit (HOSPITAL_COMMUNITY): Payer: Medicare Other | Attending: Internal Medicine

## 2010-10-21 ENCOUNTER — Encounter: Payer: Self-pay | Admitting: Internal Medicine

## 2010-10-21 DIAGNOSIS — Z09 Encounter for follow-up examination after completed treatment for conditions other than malignant neoplasm: Secondary | ICD-10-CM

## 2010-10-21 DIAGNOSIS — R9430 Abnormal result of cardiovascular function study, unspecified: Secondary | ICD-10-CM | POA: Insufficient documentation

## 2010-10-21 DIAGNOSIS — C50919 Malignant neoplasm of unspecified site of unspecified female breast: Secondary | ICD-10-CM | POA: Insufficient documentation

## 2010-10-22 ENCOUNTER — Encounter: Payer: Self-pay | Admitting: *Deleted

## 2010-10-24 NOTE — Progress Notes (Signed)
HPI:  Alyssa Jackson is a 65 y/o woman with asthma, HTN, DM2, hypothyroidism and breast CA referred by Dr. Donnie Coffin for cardiac monitoring during Herceptin therapy.  She was diagnosed with triple + L breast CA in 2004. Treated with lumpectomy + cytoxan/adriamycin/taxol + XRT at that time. On Arimidex 2004-2009. In March 2012 found to have a recurrence at the edge of her previous lumpectomy site. Underwent L mastectomy and May. Then treated with 1 round of taxotere/carboplatinum which had to be stopped due to severe diarrhea. Switched to abraxin/carbo/herceptin. Scheduled to get Herceptin q week for 18 weeks then q 3 weeks for 1 year.   Had MUGA 9/05 EF 61%. I have reviewed her echo from June 2012 (prior to any current chemo) with several colleagues and the consensus is that her EF is in the mildly depressed to low normal range range EF 45-50%. Lateral s' velocity 8.3 cm/sec.  She currently feels fine. She denies any CP or HF symptoms. Says she does wheeze at times and uses a rescue inhaler a couple of times per week. No LE edema. No orthopnea, PND, CP or palpitations. BP is generally well controlled.   ROS: All other systems normal except as mentioned in HPI, past medical history and problem list.    Past Medical History  Diagnosis Date  . Cancer     breast cancer s/p tx on SWOG protocol 9831, completed radiation 02/12/03, on Arimidex from 12/16/02-01/12/08, with reoccurance in March 2012  . Diabetes mellitus   . Hypertension   . Asthma   . Hyperlipidemia   . Incontinence   . Menopause   . Hormone replacement therapy (postmenopausal)   . Hypothyroidism   . Neutropenia     chroinic  . Anxiety   . History of hysterectomy     Current Outpatient Prescriptions  Medication Sig Dispense Refill  . ALBUTEROL IN Inhale into the lungs. 17 grams 2 puffs twice a day for asthma       . levothyroxine (SYNTHROID, LEVOTHROID) 112 MCG tablet Take 112 mcg by mouth daily.        . metFORMIN (GLUCOPHAGE) 1000 MG  tablet Take 1,000 mg by mouth 2 (two) times daily.        . montelukast (SINGULAIR) 10 MG tablet Take 10 mg by mouth daily.        Marland Kitchen omeprazole (PRILOSEC) 20 MG capsule Take 20 mg by mouth daily.        . quinapril (ACCUPRIL) 20 MG tablet Take 20 mg by mouth daily.        Marland Kitchen aspirin 81 MG tablet Take 81 mg by mouth as needed. 2 capsules up to 4 x day       . Calcium Carbonate-Vit D-Min 600-400 MG-UNIT TABS Take 400 Units by mouth 2 (two) times daily.        . Cholecalciferol (VITAMIN D3) 1000 UNITS CAPS Take by mouth daily.        Marland Kitchen estradiol (ESTRING) 2 MG vaginal ring Place 2 mg vaginally. follow package directions       . Methylcellulose, Laxative, (CITRUCEL PO) Take by mouth daily.        . Multiple Vitamin (MULTIVITAMIN PO) Take by mouth daily. [employer] Mature Multivitamins and minerals       . triamcinolone (KENALOG) 0.1 % cream Apply topically 2 (two) times daily.           Allergies  Allergen Reactions  . Adhesive (Tape)   . Iodine     Gallbladder  dye contrast tablets, pt states is not allergic to Iodine  . Latex   . Penicillins   . Sulfa Drugs Cross Reactors     History   Social History  . Marital Status: Married    Spouse Name: N/A    Number of Children: N/A  . Years of Education: N/A   Occupational History  . Not on file.   Social History Main Topics  . Smoking status: Never Smoker   . Smokeless tobacco: Not on file  . Alcohol Use: No  . Drug Use: No  . Sexually Active: Not on file   Other Topics Concern  . Not on file   Social History Narrative  . No narrative on file    Family History  Problem Relation Age of Onset  . Cancer Father   . Diabetes Father     PHYSICAL EXAM: Filed Vitals:   10/18/10 1353  BP: 150/73  Pulse: 92   General:  Well appearing. No respiratory difficulty HEENT: normal. Bright purple mohawk. Neck: supple. no JVD. Carotids 2+ bilat; no bruits. No lymphadenopathy or thryomegaly appreciated. Cor: PMI nondisplaced. Regular  rate & rhythm. No rubs, gallops or murmurs. Lungs: clear Abdomen: soft, nontender, nondistended. No hepatosplenomegaly. No bruits or masses. Good bowel sounds. Extremities: no cyanosis, clubbing, rash, edema Neuro: alert & oriented x 3, cranial nerves grossly intact. moves all 4 extremities w/o difficulty. Affect pleasant.   ASSESSMENT & PLAN:

## 2010-10-24 NOTE — Assessment & Plan Note (Addendum)
I have reviewed her echo from earlier this year with several colleagues and the consensus is that her LV function is likely very mildly reduced and thus I suspect she may have some very mild cardiotoxicity from her previous adriamycin therapy. However, she is currently asymptomatic with no over HF. She is on full-dose ACE-I and likely not a b-blocker candidate due to her asthma. While she is at higher risk for Herceptin cardiotoxicity based on her previous adriamycin therapy, I think the benefit of the therapy outweighs the risk and we should proceed cautiously with Herceptin therapy. We will check MRI and echo to establish baseline LVEF and look for any fibrosis. We will repeat echos at least every 3 months during Herceptin therapy. I discussed this at length with her and Dr. Donnie Coffin.

## 2010-10-26 ENCOUNTER — Other Ambulatory Visit: Payer: Self-pay | Admitting: Oncology

## 2010-10-26 ENCOUNTER — Encounter (HOSPITAL_BASED_OUTPATIENT_CLINIC_OR_DEPARTMENT_OTHER): Payer: Medicare Other | Admitting: Oncology

## 2010-10-26 DIAGNOSIS — Z171 Estrogen receptor negative status [ER-]: Secondary | ICD-10-CM

## 2010-10-26 DIAGNOSIS — D72819 Decreased white blood cell count, unspecified: Secondary | ICD-10-CM

## 2010-10-26 DIAGNOSIS — C50219 Malignant neoplasm of upper-inner quadrant of unspecified female breast: Secondary | ICD-10-CM

## 2010-10-26 DIAGNOSIS — Z5111 Encounter for antineoplastic chemotherapy: Secondary | ICD-10-CM

## 2010-10-26 DIAGNOSIS — Z170 Estrogen receptor positive status [ER+]: Secondary | ICD-10-CM

## 2010-10-26 DIAGNOSIS — Z853 Personal history of malignant neoplasm of breast: Secondary | ICD-10-CM

## 2010-10-26 DIAGNOSIS — Z5112 Encounter for antineoplastic immunotherapy: Secondary | ICD-10-CM

## 2010-10-26 LAB — CBC WITH DIFFERENTIAL/PLATELET
BASO%: 0.6 % (ref 0.0–2.0)
Eosinophils Absolute: 0.1 10*3/uL (ref 0.0–0.5)
HCT: 34.8 % (ref 34.8–46.6)
LYMPH%: 31.8 % (ref 14.0–49.7)
MONO#: 0.4 10*3/uL (ref 0.1–0.9)
NEUT#: 3.1 10*3/uL (ref 1.5–6.5)
Platelets: 225 10*3/uL (ref 145–400)
RBC: 4.04 10*6/uL (ref 3.70–5.45)
WBC: 5.4 10*3/uL (ref 3.9–10.3)
lymph#: 1.7 10*3/uL (ref 0.9–3.3)

## 2010-10-28 ENCOUNTER — Ambulatory Visit (HOSPITAL_COMMUNITY)
Admission: RE | Admit: 2010-10-28 | Discharge: 2010-10-28 | Disposition: A | Discharge status: Home / Self Care | Payer: Medicare Other | Source: Ambulatory Visit | Attending: Internal Medicine | Admitting: Internal Medicine

## 2010-10-28 DIAGNOSIS — C50919 Malignant neoplasm of unspecified site of unspecified female breast: Secondary | ICD-10-CM | POA: Insufficient documentation

## 2010-10-28 DIAGNOSIS — Z79899 Other long term (current) drug therapy: Secondary | ICD-10-CM | POA: Insufficient documentation

## 2010-10-28 MED ORDER — GADOBENATE DIMEGLUMINE 529 MG/ML IV SOLN
18.0000 mL | Freq: Once | INTRAVENOUS | Status: AC
  Administered 2010-10-28: 18 mL via INTRAVENOUS

## 2010-11-01 ENCOUNTER — Other Ambulatory Visit: Payer: Self-pay | Admitting: Gastroenterology

## 2010-11-02 ENCOUNTER — Encounter (HOSPITAL_BASED_OUTPATIENT_CLINIC_OR_DEPARTMENT_OTHER): Payer: Medicare Other | Admitting: Oncology

## 2010-11-02 ENCOUNTER — Other Ambulatory Visit: Payer: Self-pay | Admitting: Oncology

## 2010-11-02 DIAGNOSIS — Z853 Personal history of malignant neoplasm of breast: Secondary | ICD-10-CM

## 2010-11-02 DIAGNOSIS — D72819 Decreased white blood cell count, unspecified: Secondary | ICD-10-CM

## 2010-11-02 DIAGNOSIS — Z171 Estrogen receptor negative status [ER-]: Secondary | ICD-10-CM

## 2010-11-02 DIAGNOSIS — Z170 Estrogen receptor positive status [ER+]: Secondary | ICD-10-CM

## 2010-11-02 DIAGNOSIS — C50219 Malignant neoplasm of upper-inner quadrant of unspecified female breast: Secondary | ICD-10-CM

## 2010-11-02 DIAGNOSIS — Z5111 Encounter for antineoplastic chemotherapy: Secondary | ICD-10-CM

## 2010-11-02 DIAGNOSIS — Z5112 Encounter for antineoplastic immunotherapy: Secondary | ICD-10-CM

## 2010-11-02 LAB — CBC WITH DIFFERENTIAL/PLATELET
Basophils Absolute: 0 10*3/uL (ref 0.0–0.1)
Eosinophils Absolute: 0 10*3/uL (ref 0.0–0.5)
HGB: 11.9 g/dL (ref 11.6–15.9)
LYMPH%: 46.2 % (ref 14.0–49.7)
MCH: 30 pg (ref 25.1–34.0)
MCV: 85.5 fL (ref 79.5–101.0)
MONO%: 13.2 % (ref 0.0–14.0)
NEUT#: 1.1 10*3/uL — ABNORMAL LOW (ref 1.5–6.5)
Platelets: 176 10*3/uL (ref 145–400)

## 2010-11-03 ENCOUNTER — Ambulatory Visit
Admission: RE | Admit: 2010-11-03 | Discharge: 2010-11-03 | Disposition: A | Discharge status: Home / Self Care | Payer: Medicare Other | Source: Ambulatory Visit | Attending: Gastroenterology | Admitting: Gastroenterology

## 2010-11-03 ENCOUNTER — Encounter (HOSPITAL_BASED_OUTPATIENT_CLINIC_OR_DEPARTMENT_OTHER): Payer: Medicare Other | Admitting: Oncology

## 2010-11-03 DIAGNOSIS — C50219 Malignant neoplasm of upper-inner quadrant of unspecified female breast: Secondary | ICD-10-CM

## 2010-11-03 DIAGNOSIS — Z5189 Encounter for other specified aftercare: Secondary | ICD-10-CM

## 2010-11-09 ENCOUNTER — Other Ambulatory Visit: Payer: Self-pay | Admitting: Physician Assistant

## 2010-11-09 ENCOUNTER — Other Ambulatory Visit: Payer: Self-pay | Admitting: Oncology

## 2010-11-09 ENCOUNTER — Encounter (HOSPITAL_BASED_OUTPATIENT_CLINIC_OR_DEPARTMENT_OTHER): Payer: Medicare Other | Admitting: Oncology

## 2010-11-09 DIAGNOSIS — Z5111 Encounter for antineoplastic chemotherapy: Secondary | ICD-10-CM

## 2010-11-09 DIAGNOSIS — Z853 Personal history of malignant neoplasm of breast: Secondary | ICD-10-CM

## 2010-11-09 DIAGNOSIS — Z170 Estrogen receptor positive status [ER+]: Secondary | ICD-10-CM

## 2010-11-09 DIAGNOSIS — R300 Dysuria: Secondary | ICD-10-CM

## 2010-11-09 DIAGNOSIS — D72819 Decreased white blood cell count, unspecified: Secondary | ICD-10-CM

## 2010-11-09 DIAGNOSIS — C50219 Malignant neoplasm of upper-inner quadrant of unspecified female breast: Secondary | ICD-10-CM

## 2010-11-09 DIAGNOSIS — I959 Hypotension, unspecified: Secondary | ICD-10-CM

## 2010-11-09 LAB — COMPREHENSIVE METABOLIC PANEL
ALT: 31 U/L (ref 0–35)
CO2: 26 mEq/L (ref 19–32)
Calcium: 9.3 mg/dL (ref 8.4–10.5)
Chloride: 99 mEq/L (ref 96–112)
Glucose, Bld: 160 mg/dL — ABNORMAL HIGH (ref 70–99)
Sodium: 138 mEq/L (ref 135–145)
Total Bilirubin: 0.2 mg/dL — ABNORMAL LOW (ref 0.3–1.2)
Total Protein: 6.5 g/dL (ref 6.0–8.3)

## 2010-11-09 LAB — URINALYSIS, MICROSCOPIC - CHCC
Blood: NEGATIVE
Glucose: NEGATIVE g/dL
Nitrite: NEGATIVE

## 2010-11-09 LAB — CBC WITH DIFFERENTIAL/PLATELET
BASO%: 0.4 % (ref 0.0–2.0)
EOS%: 0.3 % (ref 0.0–7.0)
LYMPH%: 15.5 % (ref 14.0–49.7)
MCH: 28.6 pg (ref 25.1–34.0)
MCHC: 34 g/dL (ref 31.5–36.0)
MCV: 84.1 fL (ref 79.5–101.0)
MONO%: 11.6 % (ref 0.0–14.0)
Platelets: 159 10*3/uL (ref 145–400)
RBC: 4.09 10*6/uL (ref 3.70–5.45)

## 2010-11-11 LAB — URINE CULTURE

## 2010-11-16 ENCOUNTER — Other Ambulatory Visit: Payer: Self-pay | Admitting: Physician Assistant

## 2010-11-16 ENCOUNTER — Encounter (HOSPITAL_BASED_OUTPATIENT_CLINIC_OR_DEPARTMENT_OTHER): Payer: Medicare Other | Admitting: Oncology

## 2010-11-16 DIAGNOSIS — R300 Dysuria: Secondary | ICD-10-CM

## 2010-11-16 DIAGNOSIS — C50219 Malignant neoplasm of upper-inner quadrant of unspecified female breast: Secondary | ICD-10-CM

## 2010-11-16 DIAGNOSIS — Z170 Estrogen receptor positive status [ER+]: Secondary | ICD-10-CM

## 2010-11-16 DIAGNOSIS — D708 Other neutropenia: Secondary | ICD-10-CM

## 2010-11-16 DIAGNOSIS — Z171 Estrogen receptor negative status [ER-]: Secondary | ICD-10-CM

## 2010-11-16 DIAGNOSIS — Z5111 Encounter for antineoplastic chemotherapy: Secondary | ICD-10-CM

## 2010-11-16 LAB — CBC WITH DIFFERENTIAL/PLATELET
BASO%: 0.2 % (ref 0.0–2.0)
Basophils Absolute: 0 10*3/uL (ref 0.0–0.1)
EOS%: 0.4 % (ref 0.0–7.0)
Eosinophils Absolute: 0 10*3/uL (ref 0.0–0.5)
HCT: 32.2 % — ABNORMAL LOW (ref 34.8–46.6)
HGB: 10.9 g/dL — ABNORMAL LOW (ref 11.6–15.9)
LYMPH%: 20.2 % (ref 14.0–49.7)
MCH: 29 pg (ref 25.1–34.0)
MCHC: 33.9 g/dL (ref 31.5–36.0)
MCV: 85.6 fL (ref 79.5–101.0)
MONO#: 0.7 10*3/uL (ref 0.1–0.9)
MONO%: 7.4 % (ref 0.0–14.0)
NEUT#: 6.7 10*3/uL — ABNORMAL HIGH (ref 1.5–6.5)
NEUT%: 71.8 % (ref 38.4–76.8)
Platelets: 148 10*3/uL (ref 145–400)
RBC: 3.76 10*6/uL (ref 3.70–5.45)
RDW: 17.5 % — ABNORMAL HIGH (ref 11.2–14.5)
WBC: 9.3 10*3/uL (ref 3.9–10.3)
lymph#: 1.9 10*3/uL (ref 0.9–3.3)

## 2010-11-16 LAB — COMPREHENSIVE METABOLIC PANEL
ALT: 20 U/L (ref 0–35)
AST: 20 U/L (ref 0–37)
Albumin: 3 g/dL — ABNORMAL LOW (ref 3.5–5.2)
Alkaline Phosphatase: 80 U/L (ref 39–117)
BUN: 11 mg/dL (ref 6–23)
CO2: 20 mEq/L (ref 19–32)
Calcium: 7.5 mg/dL — ABNORMAL LOW (ref 8.4–10.5)
Chloride: 107 mEq/L (ref 96–112)
Creatinine, Ser: 0.58 mg/dL (ref 0.50–1.10)
Glucose, Bld: 81 mg/dL (ref 70–99)
Potassium: 3.5 mEq/L (ref 3.5–5.3)
Sodium: 141 mEq/L (ref 135–145)
Total Bilirubin: 0.3 mg/dL (ref 0.3–1.2)
Total Protein: 5 g/dL — ABNORMAL LOW (ref 6.0–8.3)

## 2010-11-22 ENCOUNTER — Encounter (HOSPITAL_COMMUNITY): Payer: Self-pay

## 2010-11-22 ENCOUNTER — Ambulatory Visit (HOSPITAL_COMMUNITY)
Admission: RE | Admit: 2010-11-22 | Discharge: 2010-11-22 | Disposition: A | Discharge status: Home / Self Care | Payer: Medicare Other | Source: Ambulatory Visit | Attending: Internal Medicine | Admitting: Internal Medicine

## 2010-11-22 DIAGNOSIS — R000 Tachycardia, unspecified: Secondary | ICD-10-CM

## 2010-11-22 DIAGNOSIS — C50919 Malignant neoplasm of unspecified site of unspecified female breast: Secondary | ICD-10-CM

## 2010-11-22 NOTE — Assessment & Plan Note (Addendum)
Doing well. We reviewed her recent MRI and echo; both of which look normal. I do believe that she may have had some mild LV dysfunction on her echo from June and I think we need to follow her closely to watch for Herceptin induced-cardiotoxicity particularly given her previous Adriamycin therapy. If her BP remains stable will try to restart quinapril at 10qd. Not candidate for b-blocker due to asthma.  Will schedule f/u visit in one month with echo. Time spent reviewing studies and seeing and examining patient = 50 minutes.

## 2010-11-22 NOTE — Progress Notes (Signed)
HPI:  Alyssa Jackson is a 65 y/o woman with asthma, HTN, DM2, hypothyroidism and breast CA referred by Dr. Donnie Coffin for cardiac monitoring during Herceptin therapy.  She was diagnosed with triple + L breast CA in 2004. Treated with lumpectomy + cytoxan/adriamycin/taxol + XRT at that time. On Arimidex 2004-2009. In March 2012 found to have a recurrence at the edge of her previous lumpectomy site. Underwent L mastectomy and May. Then treated with 1 round of taxotere/carboplatinum which had to be stopped due to severe diarrhea. Switched to abraxin/carbo/herceptin. Scheduled to get Herceptin q week for 18 weeks then q 3 weeks for 1 year.   Had MUGA 9/05 EF 61%. I have reviewed her echo from June 2012 (prior to any current chemo) with several colleagues and the consensus is that her EF is in the mildly depressed to low normal range range EF 45-50%. Lateral s' velocity 8.3 cm/sec.  She underwent f/u echo in August 2012 which I reviewed today EF 55-60% Lat s' 9.1 cm/sec. She also had cardiac MRI 10/2010: EF 56% no hyperenhancement  She currently feels fine. She denies any CP or HF. She has been struggling a bit with chemo with diarrhea and several weeks ago had some hypotension with BP 82/68. Stopped quinapril. Says she does wheeze at times and uses a rescue inhaler a couple of times per week. No LE edema. No orthopnea, PND, CP or palpitations. SBP 110-115 at home. Has chronic tachycardia and HR typically 90-110 but can go up to 120.  Getting herceptin every week.   ROS: All other systems normal except as mentioned in HPI, past medical history and problem list.    Past Medical History  Diagnosis Date  . Cancer     breast cancer s/p tx on SWOG protocol 9831, completed radiation 02/12/03, on Arimidex from 12/16/02-01/12/08, with reoccurance in March 2012  . Diabetes mellitus   . Hypertension   . Asthma   . Hyperlipidemia   . Incontinence   . Menopause   . Hormone replacement therapy (postmenopausal)   .  Hypothyroidism   . Neutropenia     chroinic  . Anxiety   . History of hysterectomy     Current Outpatient Prescriptions  Medication Sig Dispense Refill  . ALBUTEROL IN Inhale into the lungs. 17 grams 2 puffs twice a day for asthma       . aspirin 81 MG tablet Take 81 mg by mouth as needed. 2 capsules up to 4 x day       . Cholecalciferol (VITAMIN D3) 1000 UNITS CAPS Take by mouth daily.        . diazepam (VALIUM) 5 MG tablet Take 5 mg by mouth as needed.        Marland Kitchen esomeprazole (NEXIUM) 40 MG capsule Take 40 mg by mouth daily before breakfast.        . estradiol (ESTRING) 2 MG vaginal ring Place 2 mg vaginally. follow package directions       . fluconazole (DIFLUCAN) 100 MG tablet Take 100 mg by mouth daily.        Marland Kitchen ibuprofen (ADVIL,MOTRIN) 400 MG tablet Take 400 mg by mouth every 6 (six) hours as needed.        Marland Kitchen levothyroxine (SYNTHROID, LEVOTHROID) 112 MCG tablet Take 112 mcg by mouth daily.        Marland Kitchen loperamide (IMODIUM A-D) 2 MG tablet Take 2 mg by mouth 4 (four) times daily as needed.        . metFORMIN (  GLUCOPHAGE) 1000 MG tablet Take 1,000 mg by mouth 2 (two) times daily.        . montelukast (SINGULAIR) 10 MG tablet Take 10 mg by mouth daily.        Marland Kitchen omega-3 acid ethyl esters (LOVAZA) 1 G capsule Take 1 g by mouth 2 (two) times daily.        . ondansetron (ZOFRAN) 8 MG tablet Take 8 mg by mouth every 8 (eight) hours as needed.        . prochlorperazine (COMPAZINE) 10 MG tablet Take 10 mg by mouth every 6 (six) hours as needed.        . triamcinolone (KENALOG) 0.1 % cream Apply topically 2 (two) times daily.        . quinapril (ACCUPRIL) 20 MG tablet Take 20 mg by mouth daily.           Allergies  Allergen Reactions  . Adhesive (Tape)   . Iodine     Gallbladder dye contrast tablets, pt states is not allergic to Iodine  . Latex   . Penicillins   . Sulfa Drugs Cross Reactors     History   Social History  . Marital Status: Married    Spouse Name: N/A    Number of  Children: N/A  . Years of Education: N/A   Occupational History  . Not on file.   Social History Main Topics  . Smoking status: Never Smoker   . Smokeless tobacco: Not on file  . Alcohol Use: No  . Drug Use: No  . Sexually Active: Not on file   Other Topics Concern  . Not on file   Social History Narrative  . No narrative on file    Family History  Problem Relation Age of Onset  . Cancer Father   . Diabetes Father     PHYSICAL EXAM: Filed Vitals:   11/22/10 1108  BP: 126/76  Pulse: 96   General:  Well appearing. No respiratory difficulty HEENT: normal. Neck: supple. no JVD. Carotids 2+ bilat; no bruits. No lymphadenopathy or thryomegaly appreciated. Cor: PMI nondisplaced. Regular rate & rhythm. No rubs, gallops or murmurs. Lungs: clear Abdomen: soft, nontender, nondistended. No hepatosplenomegaly. No bruits or masses. Good bowel sounds. Extremities: no cyanosis, clubbing, rash, edema Neuro: alert & oriented x 3, cranial nerves grossly intact. moves all 4 extremities w/o difficulty. Affect pleasant.   ASSESSMENT & PLAN:

## 2010-11-22 NOTE — Patient Instructions (Signed)
Decrease Quinapril  Your physician has requested that you have an echocardiogram. Echocardiography is a painless test that uses sound waves to create images of your heart. It provides your doctor with information about the size and shape of your heart and how well your heart's chambers and valves are working. This procedure takes approximately one hour. There are no restrictions for this procedure.  Your physician recommends that you schedule a follow-up appointment in: 1 month

## 2010-11-22 NOTE — Assessment & Plan Note (Signed)
This is chronic problem for her but may theoretically increase her risk of cardiomyopathy. She is not a b-blocker candidate. We discussed the possibility of digoxin but will hold for now unless HR continues to trend upward.

## 2010-11-23 ENCOUNTER — Other Ambulatory Visit: Payer: Self-pay | Admitting: Physician Assistant

## 2010-11-23 ENCOUNTER — Encounter (HOSPITAL_BASED_OUTPATIENT_CLINIC_OR_DEPARTMENT_OTHER): Payer: Medicare Other | Admitting: Oncology

## 2010-11-23 DIAGNOSIS — Z171 Estrogen receptor negative status [ER-]: Secondary | ICD-10-CM

## 2010-11-23 DIAGNOSIS — Z5112 Encounter for antineoplastic immunotherapy: Secondary | ICD-10-CM

## 2010-11-23 DIAGNOSIS — C50219 Malignant neoplasm of upper-inner quadrant of unspecified female breast: Secondary | ICD-10-CM

## 2010-11-23 DIAGNOSIS — Z5111 Encounter for antineoplastic chemotherapy: Secondary | ICD-10-CM

## 2010-11-23 LAB — COMPREHENSIVE METABOLIC PANEL
ALT: 28 U/L (ref 0–35)
CO2: 24 mEq/L (ref 19–32)
Calcium: 9.1 mg/dL (ref 8.4–10.5)
Chloride: 101 mEq/L (ref 96–112)
Sodium: 138 mEq/L (ref 135–145)
Total Protein: 5.9 g/dL — ABNORMAL LOW (ref 6.0–8.3)

## 2010-11-23 LAB — CBC WITH DIFFERENTIAL/PLATELET
BASO%: 0.5 % (ref 0.0–2.0)
HCT: 28.8 % — ABNORMAL LOW (ref 34.8–46.6)
MCHC: 34.7 g/dL (ref 31.5–36.0)
MONO#: 0.3 10*3/uL (ref 0.1–0.9)
RBC: 3.25 10*6/uL — ABNORMAL LOW (ref 3.70–5.45)
RDW: 17.9 % — ABNORMAL HIGH (ref 11.2–14.5)
WBC: 4.6 10*3/uL (ref 3.9–10.3)
lymph#: 1.5 10*3/uL (ref 0.9–3.3)

## 2010-11-30 ENCOUNTER — Encounter (HOSPITAL_BASED_OUTPATIENT_CLINIC_OR_DEPARTMENT_OTHER): Payer: Medicare Other | Admitting: Oncology

## 2010-11-30 ENCOUNTER — Other Ambulatory Visit: Payer: Self-pay | Admitting: Oncology

## 2010-11-30 DIAGNOSIS — I959 Hypotension, unspecified: Secondary | ICD-10-CM

## 2010-11-30 DIAGNOSIS — Z5112 Encounter for antineoplastic immunotherapy: Secondary | ICD-10-CM

## 2010-11-30 DIAGNOSIS — Z170 Estrogen receptor positive status [ER+]: Secondary | ICD-10-CM

## 2010-11-30 DIAGNOSIS — Z171 Estrogen receptor negative status [ER-]: Secondary | ICD-10-CM

## 2010-11-30 DIAGNOSIS — C50219 Malignant neoplasm of upper-inner quadrant of unspecified female breast: Secondary | ICD-10-CM

## 2010-11-30 DIAGNOSIS — Z5111 Encounter for antineoplastic chemotherapy: Secondary | ICD-10-CM

## 2010-11-30 DIAGNOSIS — R300 Dysuria: Secondary | ICD-10-CM

## 2010-11-30 LAB — CBC WITH DIFFERENTIAL/PLATELET
BASO%: 0.4 % (ref 0.0–2.0)
EOS%: 1.3 % (ref 0.0–7.0)
LYMPH%: 61.1 % — ABNORMAL HIGH (ref 14.0–49.7)
MCH: 29.7 pg (ref 25.1–34.0)
MCHC: 34.3 g/dL (ref 31.5–36.0)
MONO#: 0.4 10*3/uL (ref 0.1–0.9)
Platelets: 208 10*3/uL (ref 145–400)
RBC: 3.5 10*6/uL — ABNORMAL LOW (ref 3.70–5.45)
WBC: 2.3 10*3/uL — ABNORMAL LOW (ref 3.9–10.3)
lymph#: 1.4 10*3/uL (ref 0.9–3.3)
nRBC: 0 % (ref 0–0)

## 2010-12-01 ENCOUNTER — Encounter (HOSPITAL_BASED_OUTPATIENT_CLINIC_OR_DEPARTMENT_OTHER): Payer: Medicare Other | Admitting: Oncology

## 2010-12-01 DIAGNOSIS — Z5189 Encounter for other specified aftercare: Secondary | ICD-10-CM

## 2010-12-01 DIAGNOSIS — C50219 Malignant neoplasm of upper-inner quadrant of unspecified female breast: Secondary | ICD-10-CM

## 2010-12-02 ENCOUNTER — Telehealth (INDEPENDENT_AMBULATORY_CARE_PROVIDER_SITE_OTHER): Payer: Self-pay | Admitting: General Surgery

## 2010-12-02 NOTE — Telephone Encounter (Signed)
REQUEST FROM SECOND TO NATURE RECEIVED FOR BREAST PROSTHESIS REPLACEMENT/ FOR WATER ACTIVITY. SIGNED BY DR. Hortense Ramal AND FAXED TO [phone].

## 2010-12-07 ENCOUNTER — Other Ambulatory Visit: Payer: Self-pay | Admitting: Physician Assistant

## 2010-12-07 ENCOUNTER — Encounter (HOSPITAL_BASED_OUTPATIENT_CLINIC_OR_DEPARTMENT_OTHER): Payer: Medicare Other | Admitting: Oncology

## 2010-12-07 DIAGNOSIS — Z171 Estrogen receptor negative status [ER-]: Secondary | ICD-10-CM

## 2010-12-07 DIAGNOSIS — Z5112 Encounter for antineoplastic immunotherapy: Secondary | ICD-10-CM

## 2010-12-07 DIAGNOSIS — C50219 Malignant neoplasm of upper-inner quadrant of unspecified female breast: Secondary | ICD-10-CM

## 2010-12-07 DIAGNOSIS — Z170 Estrogen receptor positive status [ER+]: Secondary | ICD-10-CM

## 2010-12-07 DIAGNOSIS — Z853 Personal history of malignant neoplasm of breast: Secondary | ICD-10-CM

## 2010-12-07 DIAGNOSIS — D72819 Decreased white blood cell count, unspecified: Secondary | ICD-10-CM

## 2010-12-07 LAB — CBC WITH DIFFERENTIAL/PLATELET
BASO%: 0.5 % (ref 0.0–2.0)
Basophils Absolute: 0.1 10*3/uL (ref 0.0–0.1)
EOS%: 0.1 % (ref 0.0–7.0)
HGB: 10.3 g/dL — ABNORMAL LOW (ref 11.6–15.9)
MCH: 29.6 pg (ref 25.1–34.0)
MONO%: 16.7 % — ABNORMAL HIGH (ref 0.0–14.0)
RBC: 3.48 10*6/uL — ABNORMAL LOW (ref 3.70–5.45)
RDW: 17.7 % — ABNORMAL HIGH (ref 11.2–14.5)
lymph#: 3.4 10*3/uL — ABNORMAL HIGH (ref 0.9–3.3)
nRBC: 1 % — ABNORMAL HIGH (ref 0–0)

## 2010-12-08 ENCOUNTER — Encounter: Payer: Self-pay | Admitting: *Deleted

## 2010-12-14 ENCOUNTER — Other Ambulatory Visit: Payer: Self-pay | Admitting: Oncology

## 2010-12-14 ENCOUNTER — Other Ambulatory Visit: Payer: Self-pay | Admitting: Physician Assistant

## 2010-12-14 ENCOUNTER — Encounter (HOSPITAL_BASED_OUTPATIENT_CLINIC_OR_DEPARTMENT_OTHER): Payer: Medicare Other | Admitting: Oncology

## 2010-12-14 DIAGNOSIS — Z171 Estrogen receptor negative status [ER-]: Secondary | ICD-10-CM

## 2010-12-14 DIAGNOSIS — Z853 Personal history of malignant neoplasm of breast: Secondary | ICD-10-CM

## 2010-12-14 DIAGNOSIS — C50219 Malignant neoplasm of upper-inner quadrant of unspecified female breast: Secondary | ICD-10-CM

## 2010-12-14 DIAGNOSIS — D72819 Decreased white blood cell count, unspecified: Secondary | ICD-10-CM

## 2010-12-14 DIAGNOSIS — Z170 Estrogen receptor positive status [ER+]: Secondary | ICD-10-CM

## 2010-12-14 DIAGNOSIS — Z5111 Encounter for antineoplastic chemotherapy: Secondary | ICD-10-CM

## 2010-12-14 LAB — COMPREHENSIVE METABOLIC PANEL
ALT: 16 U/L (ref 0–35)
AST: 19 U/L (ref 0–37)
CO2: 23 mEq/L (ref 19–32)
Calcium: 8.6 mg/dL (ref 8.4–10.5)
Chloride: 107 mEq/L (ref 96–112)
Potassium: 4.1 mEq/L (ref 3.5–5.3)
Sodium: 143 mEq/L (ref 135–145)
Total Protein: 6.3 g/dL (ref 6.0–8.3)

## 2010-12-14 LAB — CBC WITH DIFFERENTIAL/PLATELET
BASO%: 0.4 % (ref 0.0–2.0)
Eosinophils Absolute: 0 10*3/uL (ref 0.0–0.5)
MCHC: 33.1 g/dL (ref 31.5–36.0)
MONO#: 0.9 10*3/uL (ref 0.1–0.9)
NEUT#: 6 10*3/uL (ref 1.5–6.5)
Platelets: 168 10*3/uL (ref 145–400)
RBC: 3.65 10*6/uL — ABNORMAL LOW (ref 3.70–5.45)
RDW: 18.1 % — ABNORMAL HIGH (ref 11.2–14.5)
WBC: 9.2 10*3/uL (ref 3.9–10.3)
lymph#: 2.4 10*3/uL (ref 0.9–3.3)
nRBC: 0 % (ref 0–0)

## 2010-12-15 ENCOUNTER — Encounter: Payer: Self-pay | Admitting: *Deleted

## 2010-12-21 ENCOUNTER — Encounter (HOSPITAL_BASED_OUTPATIENT_CLINIC_OR_DEPARTMENT_OTHER): Payer: Medicare Other | Admitting: Oncology

## 2010-12-21 ENCOUNTER — Other Ambulatory Visit: Payer: Self-pay | Admitting: Oncology

## 2010-12-21 ENCOUNTER — Other Ambulatory Visit: Payer: Self-pay | Admitting: Physician Assistant

## 2010-12-21 DIAGNOSIS — C50219 Malignant neoplasm of upper-inner quadrant of unspecified female breast: Secondary | ICD-10-CM

## 2010-12-21 DIAGNOSIS — I959 Hypotension, unspecified: Secondary | ICD-10-CM

## 2010-12-21 DIAGNOSIS — R300 Dysuria: Secondary | ICD-10-CM

## 2010-12-21 DIAGNOSIS — Z171 Estrogen receptor negative status [ER-]: Secondary | ICD-10-CM

## 2010-12-21 DIAGNOSIS — Z5111 Encounter for antineoplastic chemotherapy: Secondary | ICD-10-CM

## 2010-12-21 LAB — CBC WITH DIFFERENTIAL/PLATELET
BASO%: 0.4 % (ref 0.0–2.0)
EOS%: 0.8 % (ref 0.0–7.0)
HCT: 31.3 % — ABNORMAL LOW (ref 34.8–46.6)
LYMPH%: 46 % (ref 14.0–49.7)
MCH: 30.5 pg (ref 25.1–34.0)
MCHC: 33.9 g/dL (ref 31.5–36.0)
MONO#: 0.3 10*3/uL (ref 0.1–0.9)
NEUT%: 46.2 % (ref 38.4–76.8)
Platelets: 208 10*3/uL (ref 145–400)
RBC: 3.48 10*6/uL — ABNORMAL LOW (ref 3.70–5.45)
WBC: 5 10*3/uL (ref 3.9–10.3)
lymph#: 2.3 10*3/uL (ref 0.9–3.3)
nRBC: 0 % (ref 0–0)

## 2010-12-28 ENCOUNTER — Other Ambulatory Visit: Payer: Self-pay | Admitting: Physician Assistant

## 2010-12-28 ENCOUNTER — Encounter (HOSPITAL_BASED_OUTPATIENT_CLINIC_OR_DEPARTMENT_OTHER): Payer: Medicare Other | Admitting: Oncology

## 2010-12-28 DIAGNOSIS — Z853 Personal history of malignant neoplasm of breast: Secondary | ICD-10-CM

## 2010-12-28 DIAGNOSIS — Z171 Estrogen receptor negative status [ER-]: Secondary | ICD-10-CM

## 2010-12-28 DIAGNOSIS — D72819 Decreased white blood cell count, unspecified: Secondary | ICD-10-CM

## 2010-12-28 DIAGNOSIS — Z5111 Encounter for antineoplastic chemotherapy: Secondary | ICD-10-CM

## 2010-12-28 DIAGNOSIS — Z170 Estrogen receptor positive status [ER+]: Secondary | ICD-10-CM

## 2010-12-28 DIAGNOSIS — C50219 Malignant neoplasm of upper-inner quadrant of unspecified female breast: Secondary | ICD-10-CM

## 2010-12-28 LAB — CBC WITH DIFFERENTIAL/PLATELET
BASO%: 0.3 % (ref 0.0–2.0)
EOS%: 1.2 % (ref 0.0–7.0)
HGB: 10.4 g/dL — ABNORMAL LOW (ref 11.6–15.9)
MCH: 30.7 pg (ref 25.1–34.0)
MCV: 91.4 fL (ref 79.5–101.0)
MONO%: 8.2 % (ref 0.0–14.0)
RBC: 3.39 10*6/uL — ABNORMAL LOW (ref 3.70–5.45)
RDW: 17.5 % — ABNORMAL HIGH (ref 11.2–14.5)
lymph#: 1.8 10*3/uL (ref 0.9–3.3)
nRBC: 0 % (ref 0–0)

## 2010-12-28 LAB — COMPREHENSIVE METABOLIC PANEL
ALT: 21 U/L (ref 0–35)
AST: 19 U/L (ref 0–37)
Albumin: 4.3 g/dL (ref 3.5–5.2)
Alkaline Phosphatase: 49 U/L (ref 39–117)
Potassium: 4.1 mEq/L (ref 3.5–5.3)
Sodium: 137 mEq/L (ref 135–145)
Total Bilirubin: 0.4 mg/dL (ref 0.3–1.2)
Total Protein: 6.1 g/dL (ref 6.0–8.3)

## 2010-12-30 ENCOUNTER — Ambulatory Visit (HOSPITAL_COMMUNITY)
Admission: RE | Admit: 2010-12-30 | Discharge: 2010-12-30 | Disposition: A | Discharge status: Home / Self Care | Payer: Medicare Other | Source: Ambulatory Visit | Attending: Internal Medicine | Admitting: Internal Medicine

## 2010-12-30 ENCOUNTER — Ambulatory Visit (HOSPITAL_BASED_OUTPATIENT_CLINIC_OR_DEPARTMENT_OTHER)
Admission: RE | Admit: 2010-12-30 | Discharge: 2010-12-30 | Disposition: A | Discharge status: Home / Self Care | Payer: Medicare Other | Source: Ambulatory Visit | Attending: Internal Medicine | Admitting: Internal Medicine

## 2010-12-30 VITALS — BP 106/54 | HR 75 | Wt 147.5 lb

## 2010-12-30 DIAGNOSIS — I10 Essential (primary) hypertension: Secondary | ICD-10-CM | POA: Insufficient documentation

## 2010-12-30 DIAGNOSIS — C50919 Malignant neoplasm of unspecified site of unspecified female breast: Secondary | ICD-10-CM

## 2010-12-30 DIAGNOSIS — I509 Heart failure, unspecified: Secondary | ICD-10-CM | POA: Insufficient documentation

## 2010-12-30 DIAGNOSIS — E119 Type 2 diabetes mellitus without complications: Secondary | ICD-10-CM | POA: Insufficient documentation

## 2010-12-30 DIAGNOSIS — I369 Nonrheumatic tricuspid valve disorder, unspecified: Secondary | ICD-10-CM

## 2010-12-30 NOTE — Progress Notes (Signed)
HPI:  Alyssa Jackson is a 65 y/o woman with asthma, HTN, DM2, hypothyroidism and breast CA referred by Dr. Donnie Coffin for cardiac monitoring during Herceptin therapy.  She was diagnosed with triple + L breast CA in 2004. Treated with lumpectomy + cytoxan/adriamycin/taxol + XRT at that time. On Arimidex 2004-2009. In March 2012 found to have a recurrence at the edge of her previous lumpectomy site. Underwent L mastectomy and May. Then treated with 1 round of taxotere/carboplatinum which had to be stopped due to severe diarrhea. Switched to abraxin/carbo/herceptin. Scheduled to get Herceptin q week for 18 weeks then q 3 weeks for 1 year.   Had MUGA 9/05 EF 61%. I have reviewed her echo from June 2012 (prior to any current chemo) with several colleagues and the consensus is that her EF is in the mildly depressed to low normal range range EF 45-50%. Lateral s' velocity 8.3 cm/sec.  She underwent f/u echo in August 2012 which I reviewed today EF 55-60% Lat s' 9.1 cm/sec. She also had cardiac MRI 10/2010: EF 56% no hyperenhancement  She currently feels fine. She denies any CP or HF. She has been struggling a bit with chemo with diarrhea.She continues with chemo weekly.  Dr Donnie Coffin reduced ace inhibitor to 10 mg daily. She is not wheezing and only using inhaler once every three day.s.  No LE edema. No orthopnea, PND, CP or palpitations. SBP 110-115 at home. Has chronic tachycardia and HR typically 90-110.  Repeat Echo today 50-55% lateral s'10.3  Getting herceptin every week.   ROS: All other systems normal except as mentioned in HPI, past medical history and problem list.    Past Medical History  Diagnosis Date  . Diabetes mellitus   . Hypertension   . Asthma   . Hyperlipidemia   . Incontinence   . Menopause   . Hormone replacement therapy (postmenopausal)   . Hypothyroidism   . Neutropenia     chroinic  . Anxiety   . History of hysterectomy   . Cancer     breast cancer s/p tx on SWOG protocol 9831,  completed radiation 02/12/03, on Arimidex from 12/16/02-01/12/08, with reoccurance in March 2012  . Breast cancer     Current Outpatient Prescriptions  Medication Sig Dispense Refill  . ALBUTEROL IN Inhale into the lungs. 17 grams 2 puffs twice a day for asthma       . aspirin 81 MG tablet Take 81 mg by mouth as needed. 2 capsules up to 4 x day       . Cholecalciferol (VITAMIN D3) 1000 UNITS CAPS Take by mouth daily.        . ciprofloxacin (CIPRO) 500 MG tablet Take 500 mg by mouth 2 (two) times daily.        . diazepam (VALIUM) 5 MG tablet Take 5 mg by mouth as needed.        Marland Kitchen esomeprazole (NEXIUM) 40 MG capsule Take 40 mg by mouth daily before breakfast.        . estradiol (ESTRING) 2 MG vaginal ring Place 2 mg vaginally. follow package directions       . fluconazole (DIFLUCAN) 100 MG tablet Take 100 mg by mouth daily.        Marland Kitchen ibuprofen (ADVIL,MOTRIN) 400 MG tablet Take 400 mg by mouth every 6 (six) hours as needed.        Marland Kitchen levothyroxine (SYNTHROID, LEVOTHROID) 112 MCG tablet Take 112 mcg by mouth daily.        Marland Kitchen  loperamide (IMODIUM A-D) 2 MG tablet Take 2 mg by mouth 4 (four) times daily as needed.        . metFORMIN (GLUCOPHAGE) 1000 MG tablet Take 1,000 mg by mouth 2 (two) times daily.        . montelukast (SINGULAIR) 10 MG tablet Take 10 mg by mouth daily.        Marland Kitchen omega-3 acid ethyl esters (LOVAZA) 1 G capsule Take 1 g by mouth 2 (two) times daily.        . ondansetron (ZOFRAN) 8 MG tablet Take 8 mg by mouth every 8 (eight) hours as needed.        . prochlorperazine (COMPAZINE) 10 MG tablet Take 10 mg by mouth every 6 (six) hours as needed.        . quinapril (ACCUPRIL) 20 MG tablet Take 10 mg by mouth daily.       . traZODone (DESYREL) 50 MG tablet Take 50 mg by mouth at bedtime.        . triamcinolone (KENALOG) 0.1 % cream Apply topically 2 (two) times daily.           Allergies  Allergen Reactions  . Carboplatin     Positive skin test documented.  . Adhesive (Tape)   .  Iodine     Gallbladder dye contrast tablets, pt states is not allergic to Iodine  . Latex   . Penicillins   . Sulfa Drugs Cross Reactors     History   Social History  . Marital Status: Married    Spouse Name: N/A    Number of Children: N/A  . Years of Education: N/A   Occupational History  . Not on file.   Social History Main Topics  . Smoking status: Never Smoker   . Smokeless tobacco: Not on file  . Alcohol Use: No  . Drug Use: No  . Sexually Active: Not on file   Other Topics Concern  . Not on file   Social History Narrative  . No narrative on file    Family History  Problem Relation Age of Onset  . Cancer Father   . Diabetes Father     PHYSICAL EXAM: Filed Vitals:   12/30/10 1149  BP: 106/54  Pulse: 75   General:  Well appearing. No respiratory difficulty HEENT: normal. Neck: supple. no JVD. Carotids 2+ bilat; no bruits. No lymphadenopathy or thryomegaly appreciated. Cor: PMI nondisplaced. Regular rate & rhythm. No rubs, gallops or murmurs. Lungs: clear Abdomen: soft, nontender, nondistended. No hepatosplenomegaly. No bruits or masses. Good bowel sounds. Extremities: no cyanosis, clubbing, rash, edema Neuro: alert & oriented x 3, cranial nerves grossly intact. moves all 4 extremities w/o difficulty. Affect pleasant.   ASSESSMENT & PLAN:

## 2010-12-30 NOTE — Assessment & Plan Note (Addendum)
Volume status stable. ECHO stable no change. Lateral  S'10.3 (no change). She has 9 more weeks of Herceptin. Will receive repeat ECHO in 8 weeks.   Patient seen and examined with Tonye Becket, NP. We discussed all aspects of the encounter. I agree with the assessment and plan as stated above.

## 2010-12-30 NOTE — Patient Instructions (Signed)
Follow up the week before Christmas for repeat ECHO and follow up

## 2011-01-03 ENCOUNTER — Other Ambulatory Visit (HOSPITAL_COMMUNITY): Payer: Medicare Other

## 2011-01-03 ENCOUNTER — Encounter (HOSPITAL_COMMUNITY): Payer: Medicare Other

## 2011-01-03 NOTE — Progress Notes (Signed)
Patient seen and examined with Amy Clegg, NP. We discussed all aspects of the encounter. I agree with the assessment and plan as stated above.   

## 2011-01-04 ENCOUNTER — Encounter (HOSPITAL_BASED_OUTPATIENT_CLINIC_OR_DEPARTMENT_OTHER): Payer: Medicare Other | Admitting: Oncology

## 2011-01-04 ENCOUNTER — Other Ambulatory Visit: Payer: Self-pay | Admitting: Physician Assistant

## 2011-01-04 DIAGNOSIS — D72819 Decreased white blood cell count, unspecified: Secondary | ICD-10-CM

## 2011-01-04 DIAGNOSIS — Z171 Estrogen receptor negative status [ER-]: Secondary | ICD-10-CM

## 2011-01-04 DIAGNOSIS — Z5112 Encounter for antineoplastic immunotherapy: Secondary | ICD-10-CM

## 2011-01-04 DIAGNOSIS — Z170 Estrogen receptor positive status [ER+]: Secondary | ICD-10-CM

## 2011-01-04 DIAGNOSIS — C50219 Malignant neoplasm of upper-inner quadrant of unspecified female breast: Secondary | ICD-10-CM

## 2011-01-04 DIAGNOSIS — Z853 Personal history of malignant neoplasm of breast: Secondary | ICD-10-CM

## 2011-01-04 LAB — CBC WITH DIFFERENTIAL/PLATELET
EOS%: 0.7 % (ref 0.0–7.0)
Eosinophils Absolute: 0 10*3/uL (ref 0.0–0.5)
LYMPH%: 58.6 % — ABNORMAL HIGH (ref 14.0–49.7)
MCH: 30.7 pg (ref 25.1–34.0)
MCHC: 33.7 g/dL (ref 31.5–36.0)
MCV: 91.3 fL (ref 79.5–101.0)
MONO%: 10.3 % (ref 0.0–14.0)
NEUT#: 0.9 10*3/uL — ABNORMAL LOW (ref 1.5–6.5)
Platelets: 200 10*3/uL (ref 145–400)
RBC: 3.32 10*6/uL — ABNORMAL LOW (ref 3.70–5.45)
nRBC: 0 % (ref 0–0)

## 2011-01-11 ENCOUNTER — Other Ambulatory Visit: Payer: Self-pay | Admitting: Oncology

## 2011-01-11 ENCOUNTER — Encounter: Payer: Self-pay | Admitting: Oncology

## 2011-01-11 ENCOUNTER — Other Ambulatory Visit: Payer: Self-pay | Admitting: Physician Assistant

## 2011-01-11 ENCOUNTER — Ambulatory Visit (HOSPITAL_BASED_OUTPATIENT_CLINIC_OR_DEPARTMENT_OTHER): Payer: Medicare Other | Admitting: Physician Assistant

## 2011-01-11 ENCOUNTER — Ambulatory Visit (HOSPITAL_BASED_OUTPATIENT_CLINIC_OR_DEPARTMENT_OTHER): Payer: Medicare Other

## 2011-01-11 ENCOUNTER — Other Ambulatory Visit (HOSPITAL_BASED_OUTPATIENT_CLINIC_OR_DEPARTMENT_OTHER): Payer: Medicare Other | Admitting: Lab

## 2011-01-11 VITALS — BP 135/78 | HR 93 | Temp 98.3°F | Ht 63.5 in | Wt 144.8 lb

## 2011-01-11 DIAGNOSIS — R112 Nausea with vomiting, unspecified: Secondary | ICD-10-CM

## 2011-01-11 DIAGNOSIS — Z171 Estrogen receptor negative status [ER-]: Secondary | ICD-10-CM

## 2011-01-11 DIAGNOSIS — D709 Neutropenia, unspecified: Secondary | ICD-10-CM

## 2011-01-11 DIAGNOSIS — C50219 Malignant neoplasm of upper-inner quadrant of unspecified female breast: Secondary | ICD-10-CM

## 2011-01-11 DIAGNOSIS — R197 Diarrhea, unspecified: Secondary | ICD-10-CM

## 2011-01-11 DIAGNOSIS — C50919 Malignant neoplasm of unspecified site of unspecified female breast: Secondary | ICD-10-CM

## 2011-01-11 DIAGNOSIS — Z5112 Encounter for antineoplastic immunotherapy: Secondary | ICD-10-CM

## 2011-01-11 LAB — CBC WITH DIFFERENTIAL/PLATELET
BASO%: 0.9 % (ref 0.0–2.0)
EOS%: 1.9 % (ref 0.0–7.0)
HCT: 33.5 % — ABNORMAL LOW (ref 34.8–46.6)
LYMPH%: 62.3 % — ABNORMAL HIGH (ref 14.0–49.7)
MCH: 30.7 pg (ref 25.1–34.0)
MCHC: 33.7 g/dL (ref 31.5–36.0)
NEUT%: 21.8 % — ABNORMAL LOW (ref 38.4–76.8)
Platelets: 189 10*3/uL (ref 145–400)
RBC: 3.68 10*6/uL — ABNORMAL LOW (ref 3.70–5.45)
lymph#: 2 10*3/uL (ref 0.9–3.3)

## 2011-01-11 LAB — COMPREHENSIVE METABOLIC PANEL
AST: 19 U/L (ref 0–37)
Alkaline Phosphatase: 54 U/L (ref 39–117)
BUN: 14 mg/dL (ref 6–23)
Creatinine, Ser: 0.74 mg/dL (ref 0.50–1.10)

## 2011-01-11 MED ORDER — SODIUM CHLORIDE 0.9 % IJ SOLN
10.0000 mL | INTRAMUSCULAR | Status: DC | PRN
  Administered 2011-01-11: 10 mL
  Filled 2011-01-11: qty 10

## 2011-01-11 MED ORDER — SODIUM CHLORIDE 0.9 % IV SOLN
Freq: Once | INTRAVENOUS | Status: AC
  Administered 2011-01-11: 13:00:00 via INTRAVENOUS

## 2011-01-11 MED ORDER — HEPARIN SOD (PORK) LOCK FLUSH 100 UNIT/ML IV SOLN
500.0000 [IU] | Freq: Once | INTRAVENOUS | Status: AC | PRN
  Administered 2011-01-11: 500 [IU]
  Filled 2011-01-11: qty 5

## 2011-01-11 MED ORDER — TRASTUZUMAB CHEMO INJECTION 440 MG
4.0000 mg/kg | Freq: Once | INTRAVENOUS | Status: AC
  Administered 2011-01-11: 273 mg via INTRAVENOUS
  Filled 2011-01-11: qty 13

## 2011-01-11 MED ORDER — SODIUM CHLORIDE 0.9 % IV SOLN: Freq: Once | INTRAVENOUS | Status: DC

## 2011-01-11 MED ORDER — ONDANSETRON HCL 8 MG PO TABS: 8.0000 mg | ORAL_TABLET | Freq: Two times a day (BID) | ORAL | Status: DC | PRN

## 2011-01-11 NOTE — Progress Notes (Signed)
1130- Per Debbora Presto PA pt is only receiving Herceptin and 1 liter NS today.  Okay to proceed with ANC of 0.7.  Pt did receive 1 liter NS today.  MAR is showing order discontinued.

## 2011-01-11 NOTE — Progress Notes (Signed)
Progress note dictated-CTS 

## 2011-01-11 NOTE — Patient Instructions (Signed)
1530-Pt discharged ambulatory with next appointment confirmed.  Pt aware to call with any questions or concerns.  

## 2011-01-11 NOTE — Progress Notes (Deleted)
PROBLEM:  A 65-year-old [place], [place] woman with: 1. Recurrent breast carcinoma of the left breast ER PR negative HER-2     positive, S/P left simple mastectomy with sentinel node dissection     on 08/04/2010. 2. Prior history of HER-2 positive ER PR positive left breast     carcinoma treated according to SWOG protocol 9831 with completion     of radiation on 02/12/2003, Arimidex between 12/16/2002 and     01/12/2008. 3. Type 2 diabetes. 4. History of hypothyroidism. 5. History of chronic neutropenia. 6. History of chronic anxiety and hot flashes. 7. History of chronic left shoulder pain.  CURRENT THERAPY:  For week 1, cycle 5 of Abraxane at 100 mg/m2 and Herceptin given in combination 3 weeks on, 1 week off.  SUBJECTIVE:  Alyssa Jackson is seen today in anticipation of week 1, cycle 5 of her Abraxane Herceptin regimen.  Of note, she received Herceptin alone last week and a liter of normal saline.  She states that she has had episodes of diarrhea though none in the past 24 hours, but she did have nausea and emesis yesterday evening, but she attributes it more to what she ate.  She is fatigued.  She denies any fevers or chills.  No shortness of breath.  No chest pain.  Currently no nausea or emesis. Currently no diarrhea or constipation issues.  She denies any dysuria symptoms.  She is using VESIcare with good results.  She denies any neuropathy symptoms.  No bleeding or bruising symptoms.  Review of systems is otherwise negative.  ALLERGIES:  As per EMR.  ECOG STATUS:  1.  OBJECTIVE/PHYSICAL EXAMINATION:  Vital signs:  Blood pressure is 135/78, pulse 93, respirations 20, temp 98.3, weight 144 pounds.  HEENT: Conjunctivae pink.  Sclerae anicteric.  Oropharynx is benign without mucositis or candidosis.  Lungs:  Clear to auscultation.  No wheezing or rhonchi.  Heart:  Regular rate and rhythm.  Abdomen:  Soft, normal bowel sounds.  Extremities:  Free of pedal edema.  Neurologic:   Nonfocal.  LABORATORY DATA:  Hemoglobin 11.3 g, platelet count 189,000, WBC 3200 with an ANC of 700.  IMPRESSION:  A 65-year-old [place], [employer] woman with recurrent ER PR negative HER-2 positive left breast carcinoma for week 1, cycle 5 of Abraxane/Herceptin though with persistent GI symptoms. Case has been reviewed with Dr. Rubin.  PLAN:  We will hold the Abraxane both because of the GI symptoms and the fact that she has afebrile neutropenia.  She will receive Herceptin. Benadryl has been eliminated from her premeds.  We will add a liter of normal saline.  We will regroup in 1 week's time prior to week 2 cycle 5.  She understands and agrees with this plan.    ______________________________ [name], PA CS/MEDQ  D:  01/11/2011  T:  01/11/2011  Job:  [id number] 

## 2011-01-12 ENCOUNTER — Telehealth (HOSPITAL_COMMUNITY): Payer: Self-pay | Admitting: *Deleted

## 2011-01-12 NOTE — Telephone Encounter (Signed)
Copy of echo report mailed to pt per her request

## 2011-01-14 NOTE — Progress Notes (Deleted)
PROBLEM:  A 65 year old [name], [name] with: 1. Recurrent breast carcinoma of the left breast ER PR negative HER-2     positive, S/P left simple mastectomy with sentinel node dissection     on 08/04/2010. 2. Prior history of HER-2 positive ER PR positive left breast     carcinoma treated according to SWOG protocol 9831 with completion     of radiation on 02/12/2003, Arimidex between 12/16/2002 and     01/12/2008. 3. Type 2 diabetes. 4. History of hypothyroidism. 5. History of chronic neutropenia. 6. History of chronic anxiety and hot flashes. 7. History of chronic left shoulder pain.  CURRENT THERAPY:  For week 1, cycle 5 of Abraxane at 100 mg/m2 and Herceptin given in combination 3 weeks on, 1 week off.  SUBJECTIVE:  Alyssa Jackson is seen today in anticipation of week 1, cycle 5 of her Abraxane Herceptin regimen.  Of note, she received Herceptin alone last week and a liter of normal saline.  She states that she has had episodes of diarrhea though none in the past 24 hours, but she did have nausea and emesis yesterday evening, but she attributes it more to what she ate.  She is fatigued.  She denies any fevers or chills.  No shortness of breath.  No chest pain.  Currently no nausea or emesis. Currently no diarrhea or constipation issues.  She denies any dysuria symptoms.  She is using VESIcare with good results.  She denies any neuropathy symptoms.  No bleeding or bruising symptoms.  Review of systems is otherwise negative.  ALLERGIES:  As per EMR.  ECOG STATUS:  1.  OBJECTIVE/PHYSICAL EXAMINATION:  Vital signs:  Blood pressure is 135/78, pulse 93, respirations 20, temp 98.3, weight 144 pounds.  HEENT: Conjunctivae pink.  Sclerae anicteric.  Oropharynx is benign without mucositis or candidosis.  Lungs:  Clear to auscultation.  No wheezing or rhonchi.  Heart:  Regular rate and rhythm.  Abdomen:  Soft, normal bowel sounds.  Extremities:  Free of pedal edema.  Neurologic:   Nonfocal.  LABORATORY DATA:  Hemoglobin 11.3 g, platelet count 189,000, WBC 3200 with an ANC of 700.  IMPRESSION:  A 65 year old [place], [name] with recurrent ER PR negative HER-2 positive left breast carcinoma for week 1, cycle 5 of Abraxane/Herceptin though with persistent GI symptoms. Case has been reviewed with Dr. Donnie Coffin.  PLAN:  We will hold the Abraxane both because of the GI symptoms and the fact that she has afebrile neutropenia.  She will receive Herceptin. Benadryl has been eliminated from her premeds.  We will add a liter of normal saline.  We will regroup in 1 week's time prior to week 2 cycle 5.  She understands and agrees with this plan.    ______________________________ Alyssa Nimrod, PA CS/MEDQ  D:  01/11/2011  T:  01/11/2011  Job:  [id number]

## 2011-01-17 NOTE — Progress Notes (Signed)
PROBLEM:  A 65-year-old [place], [place] woman with: 1. Recurrent breast carcinoma of the left breast ER PR negative HER-2     positive, S/P left simple mastectomy with sentinel node dissection     on 08/04/2010. 2. Prior history of HER-2 positive ER PR positive left breast     carcinoma treated according to SWOG protocol 9831 with completion     of radiation on 02/12/2003, Arimidex between 12/16/2002 and     01/12/2008. 3. Type 2 diabetes. 4. History of hypothyroidism. 5. History of chronic neutropenia. 6. History of chronic anxiety and hot flashes. 7. History of chronic left shoulder pain.  CURRENT THERAPY:  For week 1, cycle 5 of Abraxane at 100 mg/m2 and Herceptin given in combination 3 weeks on, 1 week off.  SUBJECTIVE:  Alyssa Jackson is seen today in anticipation of week 1, cycle 5 of her Abraxane Herceptin regimen.  Of note, she received Herceptin alone last week and a liter of normal saline.  She states that she has had episodes of diarrhea though none in the past 24 hours, but she did have nausea and emesis yesterday evening, but she attributes it more to what she ate.  She is fatigued.  She denies any fevers or chills.  No shortness of breath.  No chest pain.  Currently no nausea or emesis. Currently no diarrhea or constipation issues.  She denies any dysuria symptoms.  She is using VESIcare with good results.  She denies any neuropathy symptoms.  No bleeding or bruising symptoms.  Review of systems is otherwise negative.  ALLERGIES:  As per EMR.  ECOG STATUS:  1.  OBJECTIVE/PHYSICAL EXAMINATION:  Vital signs:  Blood pressure is 135/78, pulse 93, respirations 20, temp 98.3, weight 144 pounds.  HEENT: Conjunctivae pink.  Sclerae anicteric.  Oropharynx is benign without mucositis or candidosis.  Lungs:  Clear to auscultation.  No wheezing or rhonchi.  Heart:  Regular rate and rhythm.  Abdomen:  Soft, normal bowel sounds.  Extremities:  Free of pedal edema.  Neurologic:   Nonfocal.  LABORATORY DATA:  Hemoglobin 11.3 g, platelet count 189,000, WBC 3200 with an ANC of 700.  IMPRESSION:  A 65-year-old , Alyssa Jackson woman with recurrent ER PR negative HER-2 positive left breast carcinoma for week 1, cycle 5 of Abraxane/Herceptin though with persistent GI symptoms. Case has been reviewed with Dr. Rubin.  PLAN:  We will hold the Abraxane both because of the GI symptoms and the fact that she has afebrile neutropenia.  She will receive Herceptin. Benadryl has been eliminated from her premeds.  We will add a liter of normal saline.  We will regroup in 1 week's time prior to week 2 cycle 5.  She understands and agrees with this plan.    ______________________________ Alyssa Garms, PA CS/MEDQ  D:  01/11/2011  T:  01/11/2011  Job:  [id number] 

## 2011-01-18 ENCOUNTER — Ambulatory Visit (HOSPITAL_BASED_OUTPATIENT_CLINIC_OR_DEPARTMENT_OTHER): Payer: Medicare Other | Admitting: Physician Assistant

## 2011-01-18 ENCOUNTER — Ambulatory Visit (HOSPITAL_BASED_OUTPATIENT_CLINIC_OR_DEPARTMENT_OTHER): Payer: Medicare Other

## 2011-01-18 ENCOUNTER — Other Ambulatory Visit: Payer: Self-pay | Admitting: Oncology

## 2011-01-18 ENCOUNTER — Other Ambulatory Visit: Payer: Medicare Other | Admitting: Lab

## 2011-01-18 ENCOUNTER — Other Ambulatory Visit: Payer: Self-pay

## 2011-01-18 ENCOUNTER — Encounter: Payer: Self-pay | Admitting: Physician Assistant

## 2011-01-18 VITALS — BP 151/84 | HR 91 | Temp 97.7°F | Ht 63.5 in | Wt 145.4 lb

## 2011-01-18 DIAGNOSIS — R000 Tachycardia, unspecified: Secondary | ICD-10-CM

## 2011-01-18 DIAGNOSIS — C50919 Malignant neoplasm of unspecified site of unspecified female breast: Secondary | ICD-10-CM

## 2011-01-18 DIAGNOSIS — C50219 Malignant neoplasm of upper-inner quadrant of unspecified female breast: Secondary | ICD-10-CM

## 2011-01-18 DIAGNOSIS — I959 Hypotension, unspecified: Secondary | ICD-10-CM

## 2011-01-18 DIAGNOSIS — Z171 Estrogen receptor negative status [ER-]: Secondary | ICD-10-CM

## 2011-01-18 DIAGNOSIS — Z5111 Encounter for antineoplastic chemotherapy: Secondary | ICD-10-CM

## 2011-01-18 DIAGNOSIS — E119 Type 2 diabetes mellitus without complications: Secondary | ICD-10-CM

## 2011-01-18 LAB — CBC WITH DIFFERENTIAL/PLATELET
Basophils Absolute: 0 10*3/uL (ref 0.0–0.1)
EOS%: 2.9 % (ref 0.0–7.0)
HCT: 32.1 % — ABNORMAL LOW (ref 34.8–46.6)
HGB: 10.9 g/dL — ABNORMAL LOW (ref 11.6–15.9)
MCH: 30.8 pg (ref 25.1–34.0)
MCV: 90.7 fL (ref 79.5–101.0)
MONO%: 13.3 % (ref 0.0–14.0)
NEUT%: 35.5 % — ABNORMAL LOW (ref 38.4–76.8)
Platelets: 177 10*3/uL (ref 145–400)

## 2011-01-18 MED ORDER — PACLITAXEL PROTEIN-BOUND CHEMO INJECTION 100 MG
100.0000 mg/m2 | Freq: Once | INTRAVENOUS | Status: AC
  Administered 2011-01-18: 175 mg via INTRAVENOUS
  Filled 2011-01-18: qty 35

## 2011-01-18 MED ORDER — SODIUM CHLORIDE 0.9 % IJ SOLN
10.0000 mL | INTRAMUSCULAR | Status: DC | PRN
  Administered 2011-01-18: 10 mL
  Filled 2011-01-18: qty 10

## 2011-01-18 MED ORDER — ONDANSETRON 8 MG/50ML IVPB (CHCC)
8.0000 mg | Freq: Once | INTRAVENOUS | Status: AC
  Administered 2011-01-18: 8 mg via INTRAVENOUS

## 2011-01-18 MED ORDER — TRASTUZUMAB CHEMO INJECTION 440 MG: 2.0000 mg/kg | Freq: Once | INTRAVENOUS | Status: DC

## 2011-01-18 MED ORDER — ACETAMINOPHEN 325 MG PO TABS: 650.0000 mg | ORAL_TABLET | Freq: Once | ORAL | Status: DC

## 2011-01-18 MED ORDER — DEXAMETHASONE SODIUM PHOSPHATE 10 MG/ML IJ SOLN
10.0000 mg | Freq: Once | INTRAMUSCULAR | Status: AC
  Administered 2011-01-18: 10 mg via INTRAVENOUS

## 2011-01-18 MED ORDER — HEPARIN SOD (PORK) LOCK FLUSH 100 UNIT/ML IV SOLN
500.0000 [IU] | Freq: Once | INTRAVENOUS | Status: AC | PRN
  Administered 2011-01-18: 500 [IU]
  Filled 2011-01-18: qty 5

## 2011-01-18 MED ORDER — SODIUM CHLORIDE 0.9 % IV SOLN
Freq: Once | INTRAVENOUS | Status: AC
  Administered 2011-01-18: 11:00:00 via INTRAVENOUS

## 2011-01-18 NOTE — Progress Notes (Signed)
Progress note dictated-CTS 

## 2011-01-18 NOTE — Progress Notes (Signed)
PROBLEM:  A 65 year old Alyssa Jackson, [name] with: 1. Recurrent breast carcinoma, ER/PR negative, HER-2 positive, S/P     left simple mastectomy with sentinel node dissection on 08/04/2010. 2. Prior history of HER-2 positive, ER/PR positive left breast     carcinoma treated according to SWOG protocol 9831 with completion     of radiation on 02/12/2003, Arimidex between 12/16/2002 and     01/12/2008. 3. Type 2 diabetes. 4. History of hypothyroidism. 5. History of chronic neutropenia. 6. History of chronic anxiety and hot flashes. 7. History of chronic left shoulder pain.  CURRENT THERAPY:  For week 2, cycle 5 of Abraxane at 1000mg /sq m and Herceptin given weekly combination 3 weeks on, 1 week off, though Abraxane held 1 week ago due to afebrile neutropenia.  SUBJECTIVE:  Alyssa Jackson is seen today in anticipation of week 2, cycle 5 of Abraxane/Herceptin.  She did receive Herceptin alone last week, and notes that overall she actually feels pretty well.  She denies any unexplained fevers, chills, night sweats, shortness of breath or chest pain.  No nausea, emesis, diarrhea or constipation issues.  She does state she did have some issues with diarrhea for a few days last week, but this has completely abated, again of note, this is a chronic issue. She continues to utilize [employer] which has helped significantly with her dysuria symptoms.  She denies any bleeding or bruising symptoms.  ALLERGIES:  Reviewed with the patient, as per EMR.  ECOG STATUS:  1.  PHYSICAL EXAMINATION:  Vital signs:  Blood pressure is 151/84, pulse 91, respirations 20, temp 97.7, weight 145 pounds.  HEENT:  Conjunctivae pink.  Oropharynx is benign without oral mucositis or candidosis. Lungs:  Clear to auscultation.  No wheezing or rhonchi.  Heart:  Regular rate and rhythm.  No murmurs, rubs, gallops or clicks.  Abdomen:  Soft, normal bowel sounds.  Extremities:  Benign.  Neurologic:  Nonfocal.  LABORATORY  DATA:  Hemoglobin 10.9 grams, platelet count 177,000, WBC 3500 with an ANC of 1200.  CMET pending.  IMPRESSION:  A 65 year old [place], [name] with recurrent ER/PR negative, HER-2 positive left breast carcinoma for week 2, cycle 5 of Abraxane/Herceptin.  Case reviewed with Dr. Donnie Coffin.  PLAN:  Proceed with combined therapy today as scheduled.  We will see Alyssa Jackson back in 1 week's time prior to week 3, cycle 5 and make a determination about Abraxane at that point depending on her ANC and other symptoms.  She knows to contact us, though, sooner if the need should arise.   ______________________________ Sharyl Nimrod, PA CS/MEDQ  D:  01/18/2011  T:  01/18/2011  Job:  [id number]

## 2011-01-18 NOTE — Patient Instructions (Signed)
Notify MD if any problems 

## 2011-01-25 ENCOUNTER — Ambulatory Visit: Payer: Medicare Other | Admitting: Physician Assistant

## 2011-01-25 ENCOUNTER — Other Ambulatory Visit: Payer: Medicare Other | Admitting: Lab

## 2011-01-25 ENCOUNTER — Other Ambulatory Visit: Payer: Self-pay

## 2011-01-25 ENCOUNTER — Ambulatory Visit (HOSPITAL_BASED_OUTPATIENT_CLINIC_OR_DEPARTMENT_OTHER): Payer: Medicare Other

## 2011-01-25 VITALS — BP 119/75 | HR 105 | Temp 97.7°F | Ht 63.5 in | Wt 143.3 lb

## 2011-01-25 DIAGNOSIS — C50119 Malignant neoplasm of central portion of unspecified female breast: Secondary | ICD-10-CM

## 2011-01-25 DIAGNOSIS — C50919 Malignant neoplasm of unspecified site of unspecified female breast: Secondary | ICD-10-CM

## 2011-01-25 DIAGNOSIS — Z5111 Encounter for antineoplastic chemotherapy: Secondary | ICD-10-CM

## 2011-01-25 DIAGNOSIS — Z171 Estrogen receptor negative status [ER-]: Secondary | ICD-10-CM

## 2011-01-25 DIAGNOSIS — Z5112 Encounter for antineoplastic immunotherapy: Secondary | ICD-10-CM

## 2011-01-25 DIAGNOSIS — C50219 Malignant neoplasm of upper-inner quadrant of unspecified female breast: Secondary | ICD-10-CM

## 2011-01-25 DIAGNOSIS — Z170 Estrogen receptor positive status [ER+]: Secondary | ICD-10-CM

## 2011-01-25 LAB — CBC WITH DIFFERENTIAL/PLATELET
Eosinophils Absolute: 0.1 10*3/uL (ref 0.0–0.5)
HCT: 32.8 % — ABNORMAL LOW (ref 34.8–46.6)
LYMPH%: 52.2 % — ABNORMAL HIGH (ref 14.0–49.7)
MCV: 92.8 fL (ref 79.5–101.0)
MONO#: 0.2 10*3/uL (ref 0.1–0.9)
MONO%: 6.1 % (ref 0.0–14.0)
NEUT#: 1.2 10*3/uL — ABNORMAL LOW (ref 1.5–6.5)
NEUT%: 37.1 % — ABNORMAL LOW (ref 38.4–76.8)
Platelets: 209 10*3/uL (ref 145–400)
RBC: 3.53 10*6/uL — ABNORMAL LOW (ref 3.70–5.45)

## 2011-01-25 MED ORDER — ACETAMINOPHEN 325 MG PO TABS: 650.0000 mg | ORAL_TABLET | Freq: Once | ORAL | Status: DC

## 2011-01-25 MED ORDER — SODIUM CHLORIDE 0.9 % IV SOLN: Freq: Once | INTRAVENOUS | Status: DC

## 2011-01-25 MED ORDER — HEPARIN SOD (PORK) LOCK FLUSH 100 UNIT/ML IV SOLN
500.0000 [IU] | Freq: Once | INTRAVENOUS | Status: AC | PRN
  Administered 2011-01-25: 500 [IU]
  Filled 2011-01-25: qty 5

## 2011-01-25 MED ORDER — PACLITAXEL PROTEIN-BOUND CHEMO INJECTION 100 MG
100.0000 mg/m2 | Freq: Once | INTRAVENOUS | Status: AC
  Administered 2011-01-25: 175 mg via INTRAVENOUS
  Filled 2011-01-25: qty 35

## 2011-01-25 MED ORDER — SODIUM CHLORIDE 0.9 % IV SOLN
Freq: Once | INTRAVENOUS | Status: AC
  Administered 2011-01-25: 11:00:00 via INTRAVENOUS

## 2011-01-25 MED ORDER — ONDANSETRON 8 MG/50ML IVPB (CHCC)
8.0000 mg | Freq: Once | INTRAVENOUS | Status: AC
  Administered 2011-01-25: 8 mg via INTRAVENOUS

## 2011-01-25 MED ORDER — SODIUM CHLORIDE 0.9 % IJ SOLN
10.0000 mL | INTRAMUSCULAR | Status: DC | PRN
  Administered 2011-01-25: 10 mL
  Filled 2011-01-25: qty 10

## 2011-01-25 MED ORDER — DEXAMETHASONE SODIUM PHOSPHATE 10 MG/ML IJ SOLN
10.0000 mg | Freq: Once | INTRAMUSCULAR | Status: AC
  Administered 2011-01-25: 10 mg via INTRAVENOUS

## 2011-01-25 MED ORDER — TRASTUZUMAB CHEMO INJECTION 440 MG
2.0000 mg/kg | Freq: Once | INTRAVENOUS | Status: AC
  Administered 2011-01-25: 126 mg via INTRAVENOUS
  Filled 2011-01-25: qty 6

## 2011-01-25 MED ORDER — DIPHENHYDRAMINE HCL 50 MG/ML IJ SOLN
25.0000 mg | Freq: Once | INTRAMUSCULAR | Status: AC
  Administered 2011-01-25: 25 mg via INTRAVENOUS

## 2011-01-25 NOTE — Progress Notes (Signed)
PROBLEM:  A 65 year old [place], [name] with. 1. Recurrent breast carcinoma ER PR negative HER-2 positive S/P left     simple mastectomy with sentinel node dissection on 08/04/2010. 2. Prior history of HER-2 positive ER PR positive left breast     carcinoma treated according to [facility] protocol 9831 with completion     of radiation on 02/12/2003, Arimidex between 12/16/2002 and     01/12/2008. 3. Type 2 diabetes. 4. History of hypothyroidism. 5. History of chronic neutropenia. 6. History of chronic anxiety and hot flashes. 7. History of chronic left shoulder pain.  CURRENT THERAPY:  For week 3 cycle 5 of Abraxane at 100 mg/m squared and Herceptin given in combination 3 weeks on, 1 week off.  SUBJECTIVE:  Lylianna is seen today in anticipation of week 3 cycle 5 Abraxane and Herceptin.  Of note last week she received Abraxane alone since the week prior she actually received a 2 week dose of Herceptin. She actually is feeling fine physically.  She has intermittent diarrhea issues, but she did note that following her last dose of Abraxane a week ago, she really did quite well for several days, she inexplicitly had diarrhea at 1 o'clock this morning, but beyond that she feels well.  No fevers chills, shortness of breath, chest pain.  No dysuria symptoms. She continues on VESIcare.  No bleeding or bruising symptoms.  No neuropathy symptoms.  Review of systems is negative.  ALLERGIES:  Reviewed with the patient.  CURRENT MEDICATIONS:  As per EMR.  ECOG STATUS:  1.  OBJECTIVE/PHYSICAL EXAMINATION:  Vital signs:  Blood pressure is 119/75, pulse 105, respirations 20, temp 97.7, weight 143 pounds. HEENT: Conjunctivae pink.  Sclerae anicteric.  Oropharynx is benign without oral mucositis or candidosis.  Lungs:  Clear to auscultation without wheezing or rhonchi.  Heart:  Regular rate and rhythm without murmurs, rubs, gallops, or clicks.  Abdomen:  Soft, normal bowel sounds  are present at this point.  Extremities:  Benign without pedal edema. Neurologic:  Nonfocal.  The patient is alert and oriented x3.  LABORATORY DATA:  Hemoglobin 11.1 g, platelet count 209,000, WBC 3300 with an ANC of 1200.  IMPRESSION:  A 65 year old [place], [name] with recurrent ER PR negative, HER-2 positive left breast carcinoma for week 3 cycle 5 of Abraxane/Herceptin.  Case has been reviewed with Dr. Donnie Coffin.  PLAN:  Lexis will receive treatment today as scheduled comprised of Abraxane at 100 mg/m squared and Herceptin at 2 mg/kg.  She will have Herceptin alone in 1 week's time to be followed up in 2 weeks time with labs, followup exam and week 1 cycle 6 of therapy.  She knows to contact us in the interim if the need should arise.    ______________________________ Sharyl Nimrod, PA CS/MEDQ  D:  01/25/2011  T:  01/25/2011  Job:  [id number]

## 2011-01-25 NOTE — Progress Notes (Signed)
Progress note dictated-CTS 

## 2011-02-01 ENCOUNTER — Other Ambulatory Visit: Payer: Self-pay | Admitting: Oncology

## 2011-02-01 ENCOUNTER — Other Ambulatory Visit (HOSPITAL_BASED_OUTPATIENT_CLINIC_OR_DEPARTMENT_OTHER): Payer: Medicare Other | Admitting: Lab

## 2011-02-01 ENCOUNTER — Ambulatory Visit (HOSPITAL_BASED_OUTPATIENT_CLINIC_OR_DEPARTMENT_OTHER): Payer: Medicare Other

## 2011-02-01 ENCOUNTER — Other Ambulatory Visit: Payer: Self-pay

## 2011-02-01 VITALS — BP 138/67 | HR 95 | Temp 97.8°F

## 2011-02-01 DIAGNOSIS — C50219 Malignant neoplasm of upper-inner quadrant of unspecified female breast: Secondary | ICD-10-CM

## 2011-02-01 DIAGNOSIS — Z5112 Encounter for antineoplastic immunotherapy: Secondary | ICD-10-CM

## 2011-02-01 DIAGNOSIS — Z171 Estrogen receptor negative status [ER-]: Secondary | ICD-10-CM

## 2011-02-01 DIAGNOSIS — C50919 Malignant neoplasm of unspecified site of unspecified female breast: Secondary | ICD-10-CM

## 2011-02-01 LAB — CBC WITH DIFFERENTIAL/PLATELET
Eosinophils Absolute: 0.1 10*3/uL (ref 0.0–0.5)
MCV: 89.4 fL (ref 79.5–101.0)
MONO#: 0.2 10*3/uL (ref 0.1–0.9)
MONO%: 8.2 % (ref 0.0–14.0)
NEUT#: 0.4 10*3/uL — CL (ref 1.5–6.5)
RBC: 3.67 10*6/uL — ABNORMAL LOW (ref 3.70–5.45)
RDW: 13.8 % (ref 11.2–14.5)
WBC: 2.5 10*3/uL — ABNORMAL LOW (ref 3.9–10.3)
lymph#: 1.7 10*3/uL (ref 0.9–3.3)
nRBC: 0 % (ref 0–0)

## 2011-02-01 MED ORDER — DIPHENHYDRAMINE HCL 50 MG/ML IJ SOLN
12.5000 mg | Freq: Once | INTRAMUSCULAR | Status: AC
  Administered 2011-02-01: 12.5 mg via INTRAVENOUS

## 2011-02-01 MED ORDER — ACETAMINOPHEN 325 MG PO TABS: 650.0000 mg | ORAL_TABLET | Freq: Once | ORAL | Status: DC

## 2011-02-01 MED ORDER — TRASTUZUMAB CHEMO INJECTION 440 MG
2.0000 mg/kg | Freq: Once | INTRAVENOUS | Status: AC
  Administered 2011-02-01: 126 mg via INTRAVENOUS
  Filled 2011-02-01: qty 6

## 2011-02-01 MED ORDER — HEPARIN SOD (PORK) LOCK FLUSH 100 UNIT/ML IV SOLN
500.0000 [IU] | Freq: Once | INTRAVENOUS | Status: AC | PRN
  Administered 2011-02-01: 500 [IU]
  Filled 2011-02-01: qty 5

## 2011-02-01 MED ORDER — SODIUM CHLORIDE 0.9 % IJ SOLN
10.0000 mL | INTRAMUSCULAR | Status: DC | PRN
  Administered 2011-02-01: 10 mL
  Filled 2011-02-01: qty 10

## 2011-02-01 MED ORDER — SODIUM CHLORIDE 0.9 % IV SOLN
Freq: Once | INTRAVENOUS | Status: AC
  Administered 2011-02-01: 10:00:00 via INTRAVENOUS

## 2011-02-01 NOTE — Patient Instructions (Signed)
Pt instructed to call for any problems.

## 2011-02-04 ENCOUNTER — Other Ambulatory Visit: Payer: Self-pay | Admitting: Nurse Practitioner

## 2011-02-08 ENCOUNTER — Other Ambulatory Visit: Payer: Self-pay

## 2011-02-08 ENCOUNTER — Ambulatory Visit (HOSPITAL_BASED_OUTPATIENT_CLINIC_OR_DEPARTMENT_OTHER): Payer: Medicare Other | Admitting: Physician Assistant

## 2011-02-08 ENCOUNTER — Ambulatory Visit: Payer: Medicare Other

## 2011-02-08 ENCOUNTER — Other Ambulatory Visit (HOSPITAL_BASED_OUTPATIENT_CLINIC_OR_DEPARTMENT_OTHER): Payer: Medicare Other

## 2011-02-08 DIAGNOSIS — E119 Type 2 diabetes mellitus without complications: Secondary | ICD-10-CM

## 2011-02-08 DIAGNOSIS — R000 Tachycardia, unspecified: Secondary | ICD-10-CM

## 2011-02-08 DIAGNOSIS — Z171 Estrogen receptor negative status [ER-]: Secondary | ICD-10-CM

## 2011-02-08 DIAGNOSIS — C50219 Malignant neoplasm of upper-inner quadrant of unspecified female breast: Secondary | ICD-10-CM

## 2011-02-08 DIAGNOSIS — R197 Diarrhea, unspecified: Secondary | ICD-10-CM

## 2011-02-08 DIAGNOSIS — C50919 Malignant neoplasm of unspecified site of unspecified female breast: Secondary | ICD-10-CM

## 2011-02-08 LAB — CBC WITH DIFFERENTIAL/PLATELET
Basophils Absolute: 0 10*3/uL (ref 0.0–0.1)
EOS%: 1.8 % (ref 0.0–7.0)
HCT: 33.8 % — ABNORMAL LOW (ref 34.8–46.6)
HGB: 11.4 g/dL — ABNORMAL LOW (ref 11.6–15.9)
MCH: 30.1 pg (ref 25.1–34.0)
MCV: 89.2 fL (ref 79.5–101.0)
MONO%: 17.8 % — ABNORMAL HIGH (ref 0.0–14.0)
NEUT%: 15.2 % — ABNORMAL LOW (ref 38.4–76.8)
Platelets: 205 10*3/uL (ref 145–400)

## 2011-02-08 MED ORDER — PEGFILGRASTIM INJECTION 6 MG/0.6ML
6.0000 mg | Freq: Once | SUBCUTANEOUS | Status: AC
  Administered 2011-02-08: 6 mg via SUBCUTANEOUS
  Filled 2011-02-08: qty 0.6

## 2011-02-08 NOTE — Progress Notes (Signed)
PROBLEMS: 35. A 65 year old [place], [name] with recurrent     breast carcinoma, ER/PR negative, HER-2 positive, S/P left simple     mastectomy with sentinel node dissection on 08/04/2010. 2. Prior history of HER-2 positive, ER/PR positive left breast     carcinoma treated according to [facility] protocol 9831 with completion     of radiation on 02/12/2003, Arimidex between 12/16/2002 and     01/12/2008. 3. Type 2 diabetes. 4. History of hypothyroidism. 5. History of chronic neutropenia. 6. History of chronic anxiety and hot flashes. 7. History of chronic left shoulder pain.  CURRENT THERAPY:  For week 1, cycle 6 of 6 planned Abraxane at 100mg  per m2 and Herceptin given on a weekly dose combination of 3 weeks on and 1 week off.  SUBJECTIVE:  Alyssa Jackson is seen today in anticipation of week 1, cycle 6 of Abraxane and Herceptin.  Of note, she received Herceptin alone on week 4, one week ago.  Her ANC was noted to be 0.4.  She denies any fevers, but she does have intermittent uncontrollable diarrhea which is very random.  She wears Depends on a routine basis.  She denies any nausea associated.  Again, no fevers, chills, or shortness of breath.  No chest pain.  No neuropathy symptoms.  Emotionally, she is getting a bit down with the randomness of the diarrhea symptoms.  She has utilized Imodium effectively in the past.  No bleeding or bruising symptoms.  REVIEW OF SYSTEMS:  Otherwise, negative.  ALLERGIES:  Reviewed with the patient, as per EMR.  PERFORMANCE STATUS:  ECOG status of 1.  PHYSICAL EXAMINATION:  Vital Signs:  Blood pressure is 118/79.  Pulse 73.  Respirations 20.  Temp 98.4.  Weight 143 pounds.  HEENT: Conjunctivae are pink.  Oropharynx is benign.  No oral mucositis or candidosis present.  Lungs:  Clear to auscultation without wheezing or rhonchi.  Heart:  Regular rate and rhythm without murmurs, rubs, gallops, or clicks.  Abdomen:  Soft and nontender without  organomegaly. Normal bowel sounds.  Extremities:  Benign without pedal edema. Neurologic Exam:  The patient is alert and oriented x3.  LABORATORY DATA:  Hemoglobin 11.4 g, platelet count 205,000, WBC 2,800 with an ANC of 400.  IMPRESSION:  A 65 year old [place], [name] with recurrent ER/PR negative, HER-2 positive left breast carcinoma for week 1, cycle 6 of Abraxane/Herceptin.  PLAN:  I will hold the Abraxane component of Alyssa Jackson's treatment today. In fact, we will hold the Herceptin also.  We will actually give her 2 weeks off.  Therefore, I will dose her with Neulasta 6 mg injection today.  She is scheduled for followup with Dr. Donnie Coffin on 02/22/2011. This would have been in anticipation of week 3, cycle 6, but she and he can discuss whether to continue with the adjuvant portion of chemotherapy, i.e., the Abraxane, or whether to keep to switch over to a q.3 week Herceptin dosing as maintenance.  She will monitor herself for neutropenic issues.  A copy of her CBC has been provided.  She will also let us know if her diarrhea symptoms should become more consistent.  She will utilize Imodium appropriately.    ______________________________ Sharyl Nimrod, PA CS/MEDQ  D:  02/08/2011  T:  02/08/2011  Job:  [id number]

## 2011-02-08 NOTE — Progress Notes (Signed)
Progress note dictated-CTS 

## 2011-02-10 ENCOUNTER — Ambulatory Visit (INDEPENDENT_AMBULATORY_CARE_PROVIDER_SITE_OTHER): Payer: Medicare Other | Admitting: General Surgery

## 2011-02-11 ENCOUNTER — Encounter (INDEPENDENT_AMBULATORY_CARE_PROVIDER_SITE_OTHER): Payer: Self-pay | Admitting: General Surgery

## 2011-02-11 ENCOUNTER — Ambulatory Visit (INDEPENDENT_AMBULATORY_CARE_PROVIDER_SITE_OTHER): Payer: Medicare Other | Admitting: General Surgery

## 2011-02-11 VITALS — BP 138/74 | HR 104 | Temp 98.4°F | Resp 20 | Ht 64.0 in | Wt 142.4 lb

## 2011-02-11 DIAGNOSIS — C50919 Malignant neoplasm of unspecified site of unspecified female breast: Secondary | ICD-10-CM

## 2011-02-11 NOTE — Progress Notes (Signed)
Subjective:     Patient ID: Alyssa Jackson, female   DOB: [birth year], 65 y.o.   MRN: [id number]  HPI The patient is a 65 year old white female who is now almost 6 months out from a left mastectomy and sentinel node biopsy for a T2 N0 left breast cancer. Since her last visit she has started chemotherapy. Unfortunately she is not tolerating this very well. She is complaining of profuse diarrhea. At this point they have stopped giving her chemotherapy. She denies any chest pain.  Review of Systems  Constitutional: Negative.   HENT: Negative.   Eyes: Negative.   Respiratory: Negative.   Cardiovascular: Negative.   Gastrointestinal: Positive for diarrhea.  Genitourinary: Negative.   Musculoskeletal: Negative.   Skin: Negative.   Neurological: Negative.   Hematological: Negative.   Psychiatric/Behavioral: Negative.        Objective:   Physical Exam  Constitutional: She is oriented to person, place, and time. She appears well-developed and well-nourished.  HENT:  Head: Normocephalic and atraumatic.  Eyes: Conjunctivae and EOM are normal. Pupils are equal, round, and reactive to light.  Neck: Normal range of motion. Neck supple.  Cardiovascular: Normal rate, regular rhythm and normal heart sounds.   Pulmonary/Chest: Effort normal and breath sounds normal.       No palpable mass of the left chest wall. No palpable mass of the right breast. No axillary supraclavicular or cervical lymphadenopathy.No evidence of seroma  Abdominal: Soft. Bowel sounds are normal. She exhibits no mass. There is no tenderness.  Musculoskeletal: Normal range of motion.  Lymphadenopathy:    She has no cervical adenopathy.  Neurological: She is alert and oriented to person, place, and time.  Skin: Skin is warm and dry.  Psychiatric: She has a normal mood and affect. Her behavior is normal.       Assessment:     Almost 6 months status post left mastectomy and negative sentinel node biopsy    Plan:     At  this point she is having trouble tolerating her chemotherapy. I have asked her to ask her oncologist if they think adding probiotics may help her diarrhea. She will continue to do regular self exams. We will plan to see her back in about 3 months

## 2011-02-11 NOTE — Patient Instructions (Signed)
Continue regular self exams Consider probiotic if ok with oncologists

## 2011-02-15 ENCOUNTER — Other Ambulatory Visit: Payer: Self-pay | Admitting: Certified Registered Nurse Anesthetist

## 2011-02-15 ENCOUNTER — Ambulatory Visit: Payer: Medicare Other

## 2011-02-15 ENCOUNTER — Ambulatory Visit: Payer: Medicare Other | Admitting: Physician Assistant

## 2011-02-15 ENCOUNTER — Other Ambulatory Visit: Payer: Medicare Other | Admitting: Lab

## 2011-02-22 ENCOUNTER — Other Ambulatory Visit: Payer: Self-pay

## 2011-02-22 ENCOUNTER — Other Ambulatory Visit: Payer: Self-pay | Admitting: Oncology

## 2011-02-22 ENCOUNTER — Ambulatory Visit (HOSPITAL_BASED_OUTPATIENT_CLINIC_OR_DEPARTMENT_OTHER): Payer: Medicare Other

## 2011-02-22 ENCOUNTER — Telehealth: Payer: Self-pay | Admitting: *Deleted

## 2011-02-22 ENCOUNTER — Other Ambulatory Visit (HOSPITAL_BASED_OUTPATIENT_CLINIC_OR_DEPARTMENT_OTHER): Payer: Medicare Other | Admitting: Lab

## 2011-02-22 ENCOUNTER — Ambulatory Visit (HOSPITAL_BASED_OUTPATIENT_CLINIC_OR_DEPARTMENT_OTHER): Payer: Medicare Other | Admitting: Oncology

## 2011-02-22 VITALS — BP 120/80 | HR 82 | Temp 97.8°F | Ht 64.0 in | Wt 142.4 lb

## 2011-02-22 DIAGNOSIS — C50919 Malignant neoplasm of unspecified site of unspecified female breast: Secondary | ICD-10-CM

## 2011-02-22 DIAGNOSIS — Z170 Estrogen receptor positive status [ER+]: Secondary | ICD-10-CM

## 2011-02-22 DIAGNOSIS — Z5112 Encounter for antineoplastic immunotherapy: Secondary | ICD-10-CM

## 2011-02-22 DIAGNOSIS — C50219 Malignant neoplasm of upper-inner quadrant of unspecified female breast: Secondary | ICD-10-CM

## 2011-02-22 LAB — CBC WITH DIFFERENTIAL/PLATELET
Eosinophils Absolute: 0.1 10*3/uL (ref 0.0–0.5)
LYMPH%: 30.4 % (ref 14.0–49.7)
MCHC: 33.3 g/dL (ref 31.5–36.0)
MCV: 89.2 fL (ref 79.5–101.0)
MONO%: 8.5 % (ref 0.0–14.0)
NEUT%: 59.2 % (ref 38.4–76.8)
Platelets: 224 10*3/uL (ref 145–400)
RBC: 3.9 10*6/uL (ref 3.70–5.45)
nRBC: 0 % (ref 0–0)

## 2011-02-22 MED ORDER — DIPHENHYDRAMINE HCL 50 MG/ML IJ SOLN
12.5000 mg | Freq: Once | INTRAMUSCULAR | Status: AC
  Administered 2011-02-22: 12.5 mg via INTRAVENOUS

## 2011-02-22 MED ORDER — TRASTUZUMAB CHEMO INJECTION 440 MG
2.0000 mg/kg | Freq: Once | INTRAVENOUS | Status: AC
  Administered 2011-02-22: 126 mg via INTRAVENOUS
  Filled 2011-02-22: qty 6

## 2011-02-22 MED ORDER — HEPARIN SOD (PORK) LOCK FLUSH 100 UNIT/ML IV SOLN
500.0000 [IU] | Freq: Once | INTRAVENOUS | Status: AC | PRN
  Administered 2011-02-22: 500 [IU]
  Filled 2011-02-22: qty 5

## 2011-02-22 MED ORDER — SODIUM CHLORIDE 0.9 % IV SOLN
Freq: Once | INTRAVENOUS | Status: AC
Start: 2011-02-22 — End: 2011-02-22
  Administered 2011-02-22: 11:00:00 via INTRAVENOUS

## 2011-02-22 MED ORDER — SODIUM CHLORIDE 0.9 % IJ SOLN
10.0000 mL | INTRAMUSCULAR | Status: DC | PRN
  Administered 2011-02-22: 10 mL
  Filled 2011-02-22: qty 10

## 2011-02-22 MED ORDER — ACETAMINOPHEN 325 MG PO TABS: 650.0000 mg | ORAL_TABLET | Freq: Once | ORAL | Status: DC

## 2011-02-22 NOTE — Telephone Encounter (Signed)
gave patient appointment for 03-2011 thru 04-2011 printed out calendar and gave to the patient 

## 2011-02-22 NOTE — Progress Notes (Signed)
Hematology and Oncology Follow Up Visit  Alyssa Jackson [id number] [birth year] 65 y.o. 02/22/2011 9:48 AM PCP dr j edwards  1. A 65 year old [place], [name] with recurrent  breast carcinoma, ER/PR negative, HER-2 positive, S/P left simple  mastectomy with sentinel node dissection on 08/04/2010. Currently s/p 5 cycles abraxane/herceptin.. 2. Prior history of HER-2 positive, ER/PR positive left breast  carcinoma treated according to [facility] protocol 9831 with completion  of radiation on 02/12/2003, Arimidex between 12/16/2002 and  01/12/2008.  3. Type 2 diabetes.  4. History of hypothyroidism.  5. History of chronic neutropenia.  6. History of chronic anxiety and hot flashes.  7. History of chronic left shoulder pain.  Principle Diagnosis: breast cancer, her 2+ on  herceptin/abraxane  Interim History:  There have been no intercurrent illness, hospitalizations or medication changes.  Medications: I have reviewed the patient's current medications.  Allergies:  Allergies  Allergen Reactions  . Carboplatin     Positive skin test documented.  . Adhesive (Tape)   . Iodine     Gallbladder dye contrast tablets, pt states is not allergic to Iodine  . Latex   . Penicillins   . Sulfa Drugs Cross Reactors     Past Medical History, Surgical history, Social history, and Family History were reviewed and updated.intermittent diarrhea, last on 12/12 associated with urgency and incontinence,   Review of Systems: Constitutional:  Negative for fever, chills, night sweats, anorexia, weight loss, pain. Cardiovascular: no chest pain or dyspnea on exertion Respiratory: no cough, shortness of breath, or wheezing Neurological: no TIA or stroke symptoms Dermatological: negative ENT: negative Skin Gastrointestinal: no abdominal pain, change in bowel habits, or black or bloody stools Genito-Urinary: no dysuria, trouble voiding, or hematuria Hematological and Lymphatic:  negative Breast: negative for breast lumps Musculoskeletal: negative Remaining ROS negative.  Physical Exam: Blood pressure 120/80, pulse 82, temperature 97.8 F (36.6 C), temperature source Oral, height 5\' 4"  (1.626 m), weight 142 lb 6.4 oz (64.592 kg). ECOG: 0 General appearance: alert, cooperative and appears stated age Head: Normocephalic, without obvious abnormality, atraumatic Neck: no adenopathy, no carotid bruit, no JVD, supple, symmetrical, trachea midline and thyroid not enlarged, symmetric, no tenderness/mass/nodules Lymph nodes: Cervical, supraclavicular, and axillary nodes normal. Cardiac : regular rate and rhythm, no murmurs or gallops Pulmonary:clear to auscultation bilaterally and normal percussion bilaterally Breasts: inspection negative, no nipple discharge or bleeding, no masses or nodularity palpable Abdomen:soft, non-tender; bowel sounds normal; no masses,  no organomegaly Extremities negative Neuro: alert, oriented, normal speech, no focal findings or movement disorder noted  Lab Results: Lab Results  Component Value Date   WBC 7.1 02/22/2011   HGB 11.6 02/22/2011   HCT 34.8 02/22/2011   MCV 89.2 02/22/2011   PLT 224 02/22/2011     Chemistry      Component Value Date/Time   NA 140 01/11/2011 0909   NA 140 01/11/2011 0909   K 3.9 01/11/2011 0909   K 3.9 01/11/2011 0909   CL 102 01/11/2011 0909   CL 102 01/11/2011 0909   CO2 28 01/11/2011 0909   CO2 28 01/11/2011 0909   BUN 14 01/11/2011 0909   BUN 14 01/11/2011 0909   CREATININE 0.74 01/11/2011 0909   CREATININE 0.74 01/11/2011 0909   GLU 161* 06/16/2010 0908      Component Value Date/Time   CALCIUM 9.7 01/11/2011 0909   CALCIUM 9.7 01/11/2011 0909   ALKPHOS 54 01/11/2011 0909   ALKPHOS 54 01/11/2011 0909   AST 19 01/11/2011  0909   AST 19 01/11/2011 0909   ALT 14 01/11/2011 0909   ALT 14 01/11/2011 0909   BILITOT 0.5 01/11/2011 0909   BILITOT 0.5 01/11/2011 0909      .pathology. Radiological Studies: chest  X-ray n/a Mammogram n/a Bone density n/a  Impression and Plan: Given hx of diarrhea , would stop abraxane and continue herceptin. . If 2d echo is stable would convert to q 3weeks starting in jan.2013.  More than 50% of the visit was spent in patient-related counselling   Pierce Crane, MD 12/18/20129:48 AM

## 2011-02-23 ENCOUNTER — Other Ambulatory Visit: Payer: Self-pay | Admitting: *Deleted

## 2011-02-23 DIAGNOSIS — C801 Malignant (primary) neoplasm, unspecified: Secondary | ICD-10-CM

## 2011-02-23 MED ORDER — TRAZODONE HCL 50 MG PO TABS: 50.0000 mg | ORAL_TABLET | Freq: Every day | ORAL | Status: DC

## 2011-02-24 ENCOUNTER — Ambulatory Visit (HOSPITAL_BASED_OUTPATIENT_CLINIC_OR_DEPARTMENT_OTHER)
Admission: RE | Admit: 2011-02-24 | Discharge: 2011-02-24 | Disposition: A | Discharge status: Home / Self Care | Payer: Medicare Other | Source: Ambulatory Visit | Attending: Internal Medicine | Admitting: Internal Medicine

## 2011-02-24 ENCOUNTER — Ambulatory Visit (HOSPITAL_COMMUNITY)
Admission: RE | Admit: 2011-02-24 | Discharge: 2011-02-24 | Disposition: A | Discharge status: Home / Self Care | Payer: Medicare Other | Source: Ambulatory Visit | Attending: Internal Medicine | Admitting: Internal Medicine

## 2011-02-24 VITALS — BP 122/70 | HR 70 | Wt 143.5 lb

## 2011-02-24 DIAGNOSIS — C50919 Malignant neoplasm of unspecified site of unspecified female breast: Secondary | ICD-10-CM

## 2011-02-24 DIAGNOSIS — I509 Heart failure, unspecified: Secondary | ICD-10-CM | POA: Insufficient documentation

## 2011-02-24 DIAGNOSIS — E119 Type 2 diabetes mellitus without complications: Secondary | ICD-10-CM | POA: Insufficient documentation

## 2011-02-24 DIAGNOSIS — I10 Essential (primary) hypertension: Secondary | ICD-10-CM | POA: Insufficient documentation

## 2011-02-24 DIAGNOSIS — I519 Heart disease, unspecified: Secondary | ICD-10-CM

## 2011-02-24 NOTE — Patient Instructions (Signed)
Your physician has requested that you have an echocardiogram. Echocardiography is a painless test that uses sound waves to create images of your heart. It provides your doctor with information about the size and shape of your heart and how well your heart's chambers and valves are working. This procedure takes approximately one hour. There are no restrictions for this procedure.  IN 3 MONTHS  Your physician recommends that you schedule a follow-up appointment in: 3 months.  

## 2011-02-24 NOTE — Progress Notes (Signed)
  Echocardiogram 2D Echocardiogram has been performed.  Alyssa Jackson 02/24/2011, 12:54 PM

## 2011-02-24 NOTE — Progress Notes (Signed)
Encounter addended by: Noralee Space, RN on: 02/24/2011  1:37 PM<BR>     Documentation filed: Patient Instructions Section, Orders

## 2011-02-24 NOTE — Assessment & Plan Note (Signed)
Doing well from cardiac perspective. All echos reviewed with her. EF and lateral s' velocities are stable. Continue Herceptin with q3 month echos.

## 2011-02-24 NOTE — Progress Notes (Signed)
HPI:  Alyssa Jackson is a 65 y/o woman with asthma, HTN, DM2, hypothyroidism and breast CA referred by Dr. Donnie Coffin for cardiac monitoring during Herceptin therapy.  She was diagnosed with triple + L breast CA in 2004. Treated with lumpectomy + cytoxan/adriamycin/taxol + XRT at that time. On Arimidex 2004-2009. In March 2012 found to have a recurrence at the edge of her previous lumpectomy site. Underwent L mastectomy and May. Then treated with 1 round of taxotere/carboplatinum which had to be stopped due to severe diarrhea. Switched to abraxin/carbo/herceptin. Scheduled to get Herceptin q week for 18 weeks then q 3 weeks for 1 year.   Had MUGA 9/05 EF 61%. I have reviewed her echo from June 2012 (prior to any current chemo) with several colleagues and the consensus is that her EF is in the mildly depressed to low normal range range EF 45-50%. Lateral s' velocity 8.3 cm/sec.  She underwent f/u echo in August 2012 which I reviewed today EF 55-60% Lat s' 9.1 cm/sec. She also had cardiac MRI 10/2010: EF 56% no hyperenhancement  She currently feels fine. She denies any CP or HF. She has been struggling a bit with chemo with diarrhea.She continues with chemo weekly.  Dr Donnie Coffin reduced ace inhibitor to 10 mg daily. She is not wheezing and only using inhaler once every three day.s.  No LE edema. No orthopnea, PND, CP or palpitations. SBP 110-115 at home. Has chronic tachycardia and HR typically 90-110.  Echo August 2012 55-60% lat s' 9.1 cm/sec  Echo 12/2010 50-55% lateral s' ~8.6  (initially read as 10 cm/s but think this is artifact)  Echo 02/24/11 EF 55-605 lateral s' 8.8 (poor window)  Struggling with chemo due to rash and diarrhea. No CP/SOB/edema. No wheezing hasn't had to use albuterol in 2 weeks.  ROS: All other systems normal except as mentioned in HPI, past medical history and problem list.    Past Medical History  Diagnosis Date  . Diabetes mellitus   . Hypertension   . Asthma   . Hyperlipidemia     . Incontinence   . Menopause   . Hormone replacement therapy (postmenopausal)   . Hypothyroidism   . Neutropenia     chroinic  . Anxiety   . History of hysterectomy   . Cancer     breast cancer s/p tx on [facility] protocol 9831, completed radiation 02/12/03, on Arimidex from 12/16/02-01/12/08, with reoccurance in March 2012  . Breast cancer     Current Outpatient Prescriptions  Medication Sig Dispense Refill  . ALBUTEROL IN Inhale into the lungs. 17 grams 2 puffs twice a day for asthma       . aspirin 81 MG tablet Take 81 mg by mouth as needed. 2 capsules up to 4 x day       . Cholecalciferol (VITAMIN D PO) Take 5,000 mg by mouth daily.        . ciprofloxacin (CIPRO) 500 MG tablet Take 500 mg by mouth as needed.       Marland Kitchen esomeprazole (NEXIUM) 40 MG capsule Take 40 mg by mouth daily before breakfast.        . estradiol (ESTRING) 2 MG vaginal ring Place 2 mg vaginally. follow package directions       . fluconazole (DIFLUCAN) 100 MG tablet Take 100 mg by mouth as needed.       Marland Kitchen ibuprofen (ADVIL,MOTRIN) 400 MG tablet Take 400 mg by mouth every 6 (six) hours as needed.        Marland Kitchen  levothyroxine (SYNTHROID, LEVOTHROID) 112 MCG tablet Take 112 mcg by mouth daily.        Marland Kitchen loperamide (IMODIUM A-D) 2 MG tablet Take 2 mg by mouth 4 (four) times daily as needed.        . metFORMIN (GLUCOPHAGE) 1000 MG tablet Take 1,000 mg by mouth 2 (two) times daily.        . montelukast (SINGULAIR) 10 MG tablet Take 10 mg by mouth daily.        Marland Kitchen omega-3 acid ethyl esters (LOVAZA) 1 G capsule Take 1 g by mouth 2 (two) times daily.        . prochlorperazine (COMPAZINE) 10 MG tablet Take 10 mg by mouth every 6 (six) hours as needed.        . quinapril (ACCUPRIL) 20 MG tablet Take 10 mg by mouth daily.       . traZODone (DESYREL) 50 MG tablet Take 1 tablet (50 mg total) by mouth at bedtime.  30 tablet  0  . triamcinolone (KENALOG) 0.1 % cream Apply topically 2 (two) times daily.        . VESICARE 10 MG tablet daily.        No current facility-administered medications for this encounter.   Facility-Administered Medications Ordered in Other Encounters  Medication Dose Route Frequency Provider Last Rate Last Dose  . sodium chloride 0.9 % injection 10 mL  10 mL Intracatheter PRN Amada Kingfisher, PA   10 mL at 01/11/11 1528     Allergies  Allergen Reactions  . Carboplatin     Positive skin test documented.  . Adhesive (Tape)   . Iodine     Gallbladder dye contrast tablets, pt states is not allergic to Iodine  . Latex   . Penicillins   . Sulfa Drugs Cross Reactors     History   Social History  . Marital Status: Married    Spouse Name: N/A    Number of Children: N/A  . Years of Education: N/A   Occupational History  . Not on file.   Social History Main Topics  . Smoking status: Never Smoker   . Smokeless tobacco: Not on file  . Alcohol Use: No  . Drug Use: No  . Sexually Active: Not on file   Other Topics Concern  . Not on file   Social History Narrative  . No narrative on file    Family History  Problem Relation Age of Onset  . Cancer Father     soft tissue of the jaw  . Diabetes Father     PHYSICAL EXAM: Filed Vitals:   02/24/11 1243  BP: 122/70  Pulse: 70   General:  Well appearing. No respiratory difficulty HEENT:alopecic  normal. Neck: supple. no JVD. Carotids 2+ bilat; no bruits. No lymphadenopathy or thryomegaly appreciated. Cor: PMI nondisplaced. Regular rate & rhythm. No rubs, gallops or murmurs. Lungs: clear Abdomen: soft, nontender, nondistended. No hepatosplenomegaly. No bruits or masses. Good bowel sounds. Extremities: no cyanosis, clubbing, rash, edema Neuro: alert & oriented x 3, cranial nerves grossly intact. moves all 4 extremities w/o difficulty. Affect pleasant.   ASSESSMENT & PLAN:

## 2011-03-02 ENCOUNTER — Other Ambulatory Visit (HOSPITAL_BASED_OUTPATIENT_CLINIC_OR_DEPARTMENT_OTHER): Payer: Medicare Other

## 2011-03-02 ENCOUNTER — Other Ambulatory Visit: Payer: Self-pay

## 2011-03-02 ENCOUNTER — Ambulatory Visit (HOSPITAL_BASED_OUTPATIENT_CLINIC_OR_DEPARTMENT_OTHER): Payer: Medicare Other

## 2011-03-02 VITALS — BP 130/68 | HR 80 | Temp 98.5°F

## 2011-03-02 DIAGNOSIS — I959 Hypotension, unspecified: Secondary | ICD-10-CM

## 2011-03-02 DIAGNOSIS — Z5112 Encounter for antineoplastic immunotherapy: Secondary | ICD-10-CM

## 2011-03-02 DIAGNOSIS — C50919 Malignant neoplasm of unspecified site of unspecified female breast: Secondary | ICD-10-CM

## 2011-03-02 DIAGNOSIS — C50219 Malignant neoplasm of upper-inner quadrant of unspecified female breast: Secondary | ICD-10-CM

## 2011-03-02 DIAGNOSIS — Z5111 Encounter for antineoplastic chemotherapy: Secondary | ICD-10-CM

## 2011-03-02 DIAGNOSIS — R300 Dysuria: Secondary | ICD-10-CM

## 2011-03-02 LAB — CBC WITH DIFFERENTIAL/PLATELET
Eosinophils Absolute: 0.2 10*3/uL (ref 0.0–0.5)
LYMPH%: 38.9 % (ref 14.0–49.7)
MONO#: 0.5 10*3/uL (ref 0.1–0.9)
NEUT#: 2 10*3/uL (ref 1.5–6.5)
Platelets: 201 10*3/uL (ref 145–400)
RBC: 3.89 10*6/uL (ref 3.70–5.45)
RDW: 14.4 % (ref 11.2–14.5)
WBC: 4.3 10*3/uL (ref 3.9–10.3)

## 2011-03-02 MED ORDER — SODIUM CHLORIDE 0.9 % IV SOLN
Freq: Once | INTRAVENOUS | Status: AC
  Administered 2011-03-02: 10:00:00 via INTRAVENOUS

## 2011-03-02 MED ORDER — TRASTUZUMAB CHEMO INJECTION 440 MG
2.0000 mg/kg | Freq: Once | INTRAVENOUS | Status: AC
  Administered 2011-03-02: 126 mg via INTRAVENOUS
  Filled 2011-03-02: qty 6

## 2011-03-02 MED ORDER — DIPHENHYDRAMINE HCL 50 MG/ML IJ SOLN
12.5000 mg | Freq: Once | INTRAMUSCULAR | Status: AC
  Administered 2011-03-02: 12.5 mg via INTRAVENOUS

## 2011-03-02 NOTE — Patient Instructions (Signed)
03/02/11 1107-Pt discharged ambulatory with next appointment confirmed.  Pt aware to call with any questions or concerns.

## 2011-03-15 ENCOUNTER — Telehealth: Payer: Self-pay | Admitting: Oncology

## 2011-03-15 ENCOUNTER — Other Ambulatory Visit: Payer: Medicare Other | Admitting: Lab

## 2011-03-15 ENCOUNTER — Other Ambulatory Visit: Payer: Self-pay

## 2011-03-15 ENCOUNTER — Ambulatory Visit (HOSPITAL_BASED_OUTPATIENT_CLINIC_OR_DEPARTMENT_OTHER): Payer: Medicare Other | Admitting: Physician Assistant

## 2011-03-15 ENCOUNTER — Ambulatory Visit (HOSPITAL_BASED_OUTPATIENT_CLINIC_OR_DEPARTMENT_OTHER): Payer: Medicare Other

## 2011-03-15 ENCOUNTER — Encounter: Payer: Self-pay | Admitting: Physician Assistant

## 2011-03-15 VITALS — BP 117/76 | HR 92 | Temp 97.6°F | Ht 64.0 in | Wt 140.0 lb

## 2011-03-15 DIAGNOSIS — C50919 Malignant neoplasm of unspecified site of unspecified female breast: Secondary | ICD-10-CM

## 2011-03-15 DIAGNOSIS — E119 Type 2 diabetes mellitus without complications: Secondary | ICD-10-CM

## 2011-03-15 DIAGNOSIS — Z5111 Encounter for antineoplastic chemotherapy: Secondary | ICD-10-CM

## 2011-03-15 LAB — CBC WITH DIFFERENTIAL/PLATELET
EOS%: 3.4 % (ref 0.0–7.0)
Eosinophils Absolute: 0.1 10*3/uL (ref 0.0–0.5)
LYMPH%: 46.9 % (ref 14.0–49.7)
MCH: 29.2 pg (ref 25.1–34.0)
MCHC: 33.7 g/dL (ref 31.5–36.0)
MCV: 86.6 fL (ref 79.5–101.0)
MONO%: 9.3 % (ref 0.0–14.0)
NEUT#: 1.6 10*3/uL (ref 1.5–6.5)
Platelets: 197 10*3/uL (ref 145–400)
RBC: 4.32 10*6/uL (ref 3.70–5.45)
RDW: 13.7 % (ref 11.2–14.5)
lymph#: 1.9 10*3/uL (ref 0.9–3.3)
nRBC: 0 % (ref 0–0)

## 2011-03-15 MED ORDER — DIPHENHYDRAMINE HCL 50 MG/ML IJ SOLN
12.5000 mg | Freq: Once | INTRAMUSCULAR | Status: AC
  Administered 2011-03-15: 50 mg via INTRAVENOUS

## 2011-03-15 MED ORDER — SODIUM CHLORIDE 0.9 % IV SOLN
Freq: Once | INTRAVENOUS | Status: AC
  Administered 2011-03-15: 15:00:00 via INTRAVENOUS

## 2011-03-15 MED ORDER — HEPARIN SOD (PORK) LOCK FLUSH 100 UNIT/ML IV SOLN
500.0000 [IU] | Freq: Once | INTRAVENOUS | Status: AC | PRN
  Administered 2011-03-15: 500 [IU]
  Filled 2011-03-15: qty 5

## 2011-03-15 MED ORDER — SODIUM CHLORIDE 0.9 % IJ SOLN
10.0000 mL | INTRAMUSCULAR | Status: AC | PRN
  Administered 2011-03-15: 10 mL
  Filled 2011-03-15: qty 10

## 2011-03-15 MED ORDER — TRASTUZUMAB CHEMO INJECTION 440 MG
6.0000 mg/kg | Freq: Once | INTRAVENOUS | Status: AC
  Administered 2011-03-15: 399 mg via INTRAVENOUS
  Filled 2011-03-15: qty 19

## 2011-03-15 NOTE — Progress Notes (Signed)
PROBLEM: 1. A 66 year old [place], [name] with recurrent     breast carcinoma, ER/PR negative, HER-2 positive, status post left     simple mastectomy with sentinel node dissection on 08/04/2010,     status post 6 cycles of Abraxane/Herceptin. 2. Prior history of a HER-2 positive, ER/PR positive left breast     carcinoma treated according to [facility] 9831 protocol with completion     of radiation on 02/12/2003, Arimidex between 12/16/2002 and     01/12/2008. 3. Type 2 diabetes. 4. History of hypothyroidism. 5. History of chronic neutropenia. 6. History of chronic anxiety and hot flashes. 7. History of chronic left shoulder pain.  CURRENT THERAPY:  For next q.3-week dose of Herceptin.  SUBJECTIVE:  Alyssa Jackson is seen today for followup prior to initiating her next q.3-week dose of Herceptin.  She feels well overall.  She does note that even with single-agent Herceptin she does have a few days of diarrhea following infusion.  She utilizes her Imodium appropriately. Otherwise, though she feels that she is slowly improving, denying any unexplained fevers, chills, night sweats, shortness of breath, chest pain.  No nausea, emesis issues.  No frank neuropathy symptoms.  She is due to see Dr. Gala Jackson for repeat echocardiogram on 06/02/2011.  REVIEW OF SYSTEMS:  Negative.  ALLERGIES:  Carboplatin, adhesive tape, iodine, latex, penicillin and sulfa drugs.  CURRENT MEDICATIONS:  Updated as per EMR.  ECOG STATUS:  1.  OBJECTIVE/PHYSICAL EXAM:  Vital signs:  Blood pressure is 117/76, pulse 92, respirations 20, temp 97.6, weight 140 pounds.  HEENT:  Conjunctivae pink.  Sclerae anicteric.  Oropharynx is benign without oral mucositis or candidiasis.  Lungs:  Clear to auscultation without wheezing or rhonchi.  Heart:  Regular rate and rhythm without murmurs, rubs, gallops, or clicks.  Abdomen:  Soft, nontender without evidence of organomegaly.  Normal bowel sounds.  Extremities:   Benign.  Neurologic: Exam is nonfocal.  The patient is alert and oriented X3.  LABORATORY DATA:  Hemoglobin 12.6 g, platelet count 197,000, WBC 4100 with an ANC of 1600.  C-MET pending.  IMPRESSION:  A 66 year old [place], [name] with recurrent ER/PR negative, HER-2 positive left breast carcinoma, due for next q.3-week dose of single-agent Herceptin.  Case has been reviewed with Dr. Donnie Coffin.  PLAN:  Alyssa Jackson will receive treatment today as scheduled, her Benadryl at 12.5 mg.  She will return in 3 weeks for labs and Herceptin alone, 6 weeks for labs, followup exam and Herceptin.  She knows to contact us in the interim if the need should arise.    ______________________________ Sharyl Nimrod, PA CS/MEDQ  D:  03/15/2011  T:  03/15/2011  Job:  [id number]

## 2011-03-15 NOTE — Patient Instructions (Signed)
Call md for problems.d Calleigh Lafontant rn 

## 2011-03-15 NOTE — Telephone Encounter (Signed)
Gv pt appt for jan-may2013

## 2011-03-15 NOTE — Progress Notes (Signed)
Progress note dictated-CTS 

## 2011-03-16 ENCOUNTER — Other Ambulatory Visit: Payer: Self-pay | Admitting: Certified Registered Nurse Anesthetist

## 2011-04-05 ENCOUNTER — Other Ambulatory Visit (HOSPITAL_BASED_OUTPATIENT_CLINIC_OR_DEPARTMENT_OTHER): Payer: Medicare Other | Admitting: Lab

## 2011-04-05 ENCOUNTER — Ambulatory Visit (HOSPITAL_BASED_OUTPATIENT_CLINIC_OR_DEPARTMENT_OTHER): Payer: Medicare Other

## 2011-04-05 VITALS — BP 128/80 | HR 91 | Temp 98.5°F

## 2011-04-05 DIAGNOSIS — C50919 Malignant neoplasm of unspecified site of unspecified female breast: Secondary | ICD-10-CM

## 2011-04-05 DIAGNOSIS — Z5112 Encounter for antineoplastic immunotherapy: Secondary | ICD-10-CM

## 2011-04-05 LAB — CBC WITH DIFFERENTIAL/PLATELET
Eosinophils Absolute: 0.2 10*3/uL (ref 0.0–0.5)
HCT: 36.9 % (ref 34.8–46.6)
LYMPH%: 57.6 % — ABNORMAL HIGH (ref 14.0–49.7)
MONO#: 0.4 10*3/uL (ref 0.1–0.9)
NEUT#: 0.7 10*3/uL — ABNORMAL LOW (ref 1.5–6.5)
NEUT%: 22.6 % — ABNORMAL LOW (ref 38.4–76.8)
Platelets: 146 10*3/uL (ref 145–400)
WBC: 3.3 10*3/uL — ABNORMAL LOW (ref 3.9–10.3)
nRBC: 0 % (ref 0–0)

## 2011-04-05 MED ORDER — SODIUM CHLORIDE 0.9 % IJ SOLN
10.0000 mL | INTRAMUSCULAR | Status: DC | PRN
Start: 2011-04-05 — End: 2011-04-05
  Administered 2011-04-05: 10 mL
  Filled 2011-04-05: qty 10

## 2011-04-05 MED ORDER — SODIUM CHLORIDE 0.9 % IV SOLN
Freq: Once | INTRAVENOUS | Status: AC
  Administered 2011-04-05: 15:00:00 via INTRAVENOUS

## 2011-04-05 MED ORDER — ACETAMINOPHEN 325 MG PO TABS
650.0000 mg | ORAL_TABLET | Freq: Once | ORAL | Status: AC
  Administered 2011-04-05: 325 mg via ORAL

## 2011-04-05 MED ORDER — HEPARIN SOD (PORK) LOCK FLUSH 100 UNIT/ML IV SOLN
500.0000 [IU] | Freq: Once | INTRAVENOUS | Status: AC | PRN
  Administered 2011-04-05: 500 [IU]
  Filled 2011-04-05: qty 5

## 2011-04-05 MED ORDER — TRASTUZUMAB CHEMO INJECTION 440 MG
6.0000 mg/kg | Freq: Once | INTRAVENOUS | Status: AC
  Administered 2011-04-05: 399 mg via INTRAVENOUS
  Filled 2011-04-05: qty 19

## 2011-04-05 MED ORDER — DIPHENHYDRAMINE HCL 50 MG/ML IJ SOLN
12.5000 mg | Freq: Once | INTRAMUSCULAR | Status: AC
  Administered 2011-04-05: 12.5 mg via INTRAVENOUS

## 2011-04-05 NOTE — Patient Instructions (Signed)
Pt d/c'd in stable condition, ambulatory.  Instructed pt to call for any new questions or concerns/symptoms prior to next visit. She verbalized understanding.

## 2011-04-06 LAB — COMPREHENSIVE METABOLIC PANEL
ALT: 14 U/L (ref 0–35)
Albumin: 3.2 g/dL — ABNORMAL LOW (ref 3.5–5.2)
BUN: 10 mg/dL (ref 6–23)
CO2: 25 mEq/L (ref 19–32)
Calcium: 9.2 mg/dL (ref 8.4–10.5)
Chloride: 103 mEq/L (ref 96–112)
Creatinine, Ser: 0.87 mg/dL (ref 0.50–1.10)

## 2011-04-06 LAB — LACTATE DEHYDROGENASE: LDH: 141 U/L (ref 94–250)

## 2011-04-20 ENCOUNTER — Other Ambulatory Visit: Payer: Self-pay | Admitting: Physician Assistant

## 2011-04-26 ENCOUNTER — Ambulatory Visit (HOSPITAL_BASED_OUTPATIENT_CLINIC_OR_DEPARTMENT_OTHER): Payer: Medicare Other | Admitting: Physician Assistant

## 2011-04-26 ENCOUNTER — Ambulatory Visit (HOSPITAL_BASED_OUTPATIENT_CLINIC_OR_DEPARTMENT_OTHER): Payer: Medicare Other

## 2011-04-26 ENCOUNTER — Other Ambulatory Visit: Payer: Medicare Other | Admitting: Lab

## 2011-04-26 ENCOUNTER — Other Ambulatory Visit (INDEPENDENT_AMBULATORY_CARE_PROVIDER_SITE_OTHER): Payer: Self-pay | Admitting: General Surgery

## 2011-04-26 VITALS — BP 131/80 | HR 87 | Temp 98.0°F | Ht 64.0 in | Wt 135.7 lb

## 2011-04-26 DIAGNOSIS — C50919 Malignant neoplasm of unspecified site of unspecified female breast: Secondary | ICD-10-CM

## 2011-04-26 DIAGNOSIS — Z9012 Acquired absence of left breast and nipple: Secondary | ICD-10-CM

## 2011-04-26 DIAGNOSIS — Z5112 Encounter for antineoplastic immunotherapy: Secondary | ICD-10-CM

## 2011-04-26 DIAGNOSIS — C50912 Malignant neoplasm of unspecified site of left female breast: Secondary | ICD-10-CM

## 2011-04-26 DIAGNOSIS — E119 Type 2 diabetes mellitus without complications: Secondary | ICD-10-CM

## 2011-04-26 LAB — CBC WITH DIFFERENTIAL/PLATELET
Basophils Absolute: 0 10*3/uL (ref 0.0–0.1)
EOS%: 2.1 % (ref 0.0–7.0)
HGB: 12.7 g/dL (ref 11.6–15.9)
LYMPH%: 54.5 % — ABNORMAL HIGH (ref 14.0–49.7)
MCH: 28.5 pg (ref 25.1–34.0)
MCV: 83.4 fL (ref 79.5–101.0)
MONO%: 11.4 % (ref 0.0–14.0)
NEUT%: 31.7 % — ABNORMAL LOW (ref 38.4–76.8)
RDW: 13.5 % (ref 11.2–14.5)

## 2011-04-26 LAB — COMPREHENSIVE METABOLIC PANEL
Alkaline Phosphatase: 42 U/L (ref 39–117)
BUN: 17 mg/dL (ref 6–23)
Creatinine, Ser: 0.8 mg/dL (ref 0.50–1.10)
Glucose, Bld: 101 mg/dL — ABNORMAL HIGH (ref 70–99)
Sodium: 138 mEq/L (ref 135–145)
Total Bilirubin: 0.4 mg/dL (ref 0.3–1.2)

## 2011-04-26 MED ORDER — ACETAMINOPHEN 325 MG PO TABS
650.0000 mg | ORAL_TABLET | Freq: Once | ORAL | Status: AC
  Administered 2011-04-26: 650 mg via ORAL

## 2011-04-26 MED ORDER — SODIUM CHLORIDE 0.9 % IV SOLN: Freq: Once | INTRAVENOUS | Status: DC

## 2011-04-26 MED ORDER — DIPHENHYDRAMINE HCL 50 MG/ML IJ SOLN
12.5000 mg | Freq: Once | INTRAMUSCULAR | Status: AC
  Administered 2011-04-26: 12.5 mg via INTRAVENOUS

## 2011-04-26 MED ORDER — HEPARIN SOD (PORK) LOCK FLUSH 100 UNIT/ML IV SOLN
500.0000 [IU] | Freq: Once | INTRAVENOUS | Status: AC | PRN
  Administered 2011-04-26: 500 [IU]
  Filled 2011-04-26: qty 5

## 2011-04-26 MED ORDER — TRASTUZUMAB CHEMO INJECTION 440 MG
6.0000 mg/kg | Freq: Once | INTRAVENOUS | Status: AC
  Administered 2011-04-26: 399 mg via INTRAVENOUS
  Filled 2011-04-26: qty 19

## 2011-04-26 MED ORDER — SODIUM CHLORIDE 0.9 % IJ SOLN
10.0000 mL | INTRAMUSCULAR | Status: DC | PRN
  Administered 2011-04-26: 10 mL
  Filled 2011-04-26: qty 10

## 2011-04-26 NOTE — Progress Notes (Signed)
Hematology and Oncology Follow Up Visit  Alyssa Jackson [id number] 1945/10/27 66 y.o. 04/26/2011    HPI:  A 66 year old [place], [name] with recurrent breast carcinoma, ER/PR negative, HER-2 positive, status post left simple mastectomy with sentinel node dissection on 08/04/2010, status post 6 cycles of Abraxane/Herceptin.  1. Prior history of a HER-2 positive, ER/PR positive left breast carcinoma treated according to [facility] 9831 protocol with completion of radiation on 02/12/2003, Arimidex between 12/16/2002 and 01/12/2008.  2. Type 2 diabetes.  3. History of hypothyroidism.  4. History of chronic neutropenia.  5. History of chronic anxiety and hot flashes.  6. History of chronic left shoulder pain.   Interim History:   Alyssa Jackson is seen today prior to initiating her next q. 3 week doses maintenance Herceptin. Physically she is feeling fairly well, emotionally, she is fragile. Unfortunately within the past month, she lost a granddaughter who was born at [redacted] weeks gestation due to maternal uterine fibroids. She denies any unexplained fevers, chills, nausea issues. She does have intermittent diarrhea stools that seemed to correlate with her single agent Herceptin infusion, that she experienced diarrhea last week without any murmur reason. She attributed to "emotion" She denies shortness of breath or chest pain. Of note she is due for a 2-D echocardiogram under the care of Dr. Pola Corn on on 06/02/2011.  A detailed review of systems is otherwise noncontributory as noted below.  Review of Systems: Constitutional:  no weight loss, fever, night sweats and feels well Eyes: uses glasses ENT: no complaints Cardiovascular: no chest pain or dyspnea on exertion Respiratory: no cough, shortness of breath, or wheezing Neurological: no TIA or stroke symptoms Dermatological: negative Gastrointestinal: positive for - diarrhea, which is intermittent. Genito-Urinary: no dysuria, trouble voiding,  or hematuria Hematological and Lymphatic: negative Breast: negative Musculoskeletal: negative Remaining ROS negative.  Medications:   I have reviewed the patient's current medications.  Current Outpatient Prescriptions  Medication Sig Dispense Refill  . ALBUTEROL IN Inhale into the lungs. 17 grams 2 puffs twice a day for asthma       . aspirin 81 MG tablet Take 81 mg by mouth as needed. 2 capsules up to 4 x day       . Cholecalciferol (VITAMIN D PO) Take 5,000 mg by mouth daily.        Marland Kitchen esomeprazole (NEXIUM) 40 MG capsule Take 40 mg by mouth daily before breakfast.        . estradiol (ESTRING) 2 MG vaginal ring Place 2 mg vaginally. follow package directions       . fluconazole (DIFLUCAN) 100 MG tablet Take 100 mg by mouth as needed.       Marland Kitchen ibuprofen (ADVIL,MOTRIN) 400 MG tablet Take 400 mg by mouth every 6 (six) hours as needed.        Marland Kitchen levothyroxine (SYNTHROID, LEVOTHROID) 112 MCG tablet Take 112 mcg by mouth daily.        Marland Kitchen loperamide (IMODIUM A-D) 2 MG tablet Take 2 mg by mouth 4 (four) times daily as needed.        . metFORMIN (GLUCOPHAGE) 1000 MG tablet Take 1,000 mg by mouth 2 (two) times daily.        . montelukast (SINGULAIR) 10 MG tablet Take 10 mg by mouth daily.        Marland Kitchen omega-3 acid ethyl esters (LOVAZA) 1 G capsule Take 1 g by mouth 2 (two) times daily.        . prochlorperazine (COMPAZINE) 10 MG  tablet Take 10 mg by mouth every 6 (six) hours as needed.        . quinapril (ACCUPRIL) 20 MG tablet Take 10 mg by mouth daily.       . traZODone (DESYREL) 50 MG tablet Take 1 tablet (50 mg total) by mouth at bedtime.  30 tablet  0  . triamcinolone (KENALOG) 0.1 % cream Apply topically 2 (two) times daily.        . VESICARE 10 MG tablet daily.       No current facility-administered medications for this visit.   Facility-Administered Medications Ordered in Other Visits  Medication Dose Route Frequency Provider Last Rate Last Dose  . 0.9 %  sodium chloride infusion    Intravenous Once Pierce Crane, MD      . acetaminophen (TYLENOL) tablet 650 mg  650 mg Oral Once Pierce Crane, MD      . diphenhydrAMINE (BENADRYL) injection 12.5 mg  12.5 mg Intravenous Once Pierce Crane, MD      . heparin lock flush 100 unit/mL  500 Units Intracatheter Once PRN Pierce Crane, MD      . sodium chloride 0.9 % injection 10 mL  10 mL Intracatheter PRN Amada Kingfisher, PA   10 mL at 01/11/11 1528  . sodium chloride 0.9 % injection 10 mL  10 mL Intracatheter PRN Pierce Crane, MD   10 mL at 03/15/11 1631  . sodium chloride 0.9 % injection 10 mL  10 mL Intracatheter PRN Pierce Crane, MD      . trastuzumab (HERCEPTIN) 399 mg in sodium chloride 0.9 % 250 mL chemo infusion  6 mg/kg (Treatment Plan Actual) Intravenous Once Pierce Crane, MD        Allergies:  Allergies  Allergen Reactions  . Carboplatin     Positive skin test documented.  . Adhesive (Tape)   . Iodine     Gallbladder dye contrast tablets, pt states is not allergic to Iodine  . Latex   . Penicillins   . Sulfa Drugs Cross Reactors     Physical Exam: Filed Vitals:   04/26/11 1338  BP: 131/80  Pulse: 87  Temp: 98 F (36.7 C)  Weight: 135 lbs. HEENT:  Sclerae anicteric, conjunctivae pink.  Oropharynx clear.  No mucositis or candidiasis.   Nodes:  No cervical, supraclavicular, or axillary lymphadenopathy palpated.  Breast Exam:  Deferred.   Lungs:  Clear to auscultation bilaterally.  No crackles, rhonchi, or wheezes.   Heart:  Regular rate and rhythm.   Abdomen:  Soft, nontender.  Positive bowel sounds.  No organomegaly or masses palpated.   Musculoskeletal:  No focal spinal tenderness to palpation.  Extremities:  Benign.  No peripheral edema or cyanosis,  resolving ecchymosis of the left lower extremity from injury to the small toe. Skin:  Benign.   Neuro:  Nonfocal, alert and oriented x 3.   Lab Results: Lab Results  Component Value Date   WBC 3.8* 04/26/2011   HGB 12.7 04/26/2011   HCT 37.2 04/26/2011    MCV 83.4 04/26/2011   PLT 168 04/26/2011   NEUTROABS 1.2* 04/26/2011     Chemistry      Component Value Date/Time   NA 139 04/05/2011 1320   K 3.8 04/05/2011 1320   CL 103 04/05/2011 1320   CO2 25 04/05/2011 1320   BUN 10 04/05/2011 1320   CREATININE 0.87 04/05/2011 1320   GLU 161* 06/16/2010 0908      Component Value Date/Time   CALCIUM  9.2 04/05/2011 1320   ALKPHOS 39 04/05/2011 1320   AST 17 04/05/2011 1320   ALT 14 04/05/2011 1320   BILITOT 0.3 04/05/2011 1320         Assessment:  A 66 year old [place], [name] with  recurrent ER/PR negative, HER-2 positive left breast carcinoma, due for next q.3-week dose of single-agent Herceptin.   Case reviewed with Dr. Pierce Crane.  Plan:  Tameia will receive Herceptin today as scheduled. Her Benadryl at 12.5 mg. In 3 weeks she will return for Herceptin alone, at 6 weeks for followup exam and Herceptin. Of note, she scheduled for a 2-D echocardiogram under the care of Dr. Geanie Cooley on on 06/02/2011. Marvena knows we would be happy to see her prior to her followup if the need should arise.   This plan was reviewed with the patient, who voices understanding and agreement.  She knows to call with any changes or problems.    Samatha Anspach T, PA-C 04/26/2011

## 2011-05-16 ENCOUNTER — Encounter (INDEPENDENT_AMBULATORY_CARE_PROVIDER_SITE_OTHER): Payer: Self-pay | Admitting: General Surgery

## 2011-05-16 ENCOUNTER — Other Ambulatory Visit: Payer: Self-pay | Admitting: *Deleted

## 2011-05-16 DIAGNOSIS — C50919 Malignant neoplasm of unspecified site of unspecified female breast: Secondary | ICD-10-CM

## 2011-05-17 ENCOUNTER — Ambulatory Visit (HOSPITAL_BASED_OUTPATIENT_CLINIC_OR_DEPARTMENT_OTHER): Payer: Medicare Other

## 2011-05-17 ENCOUNTER — Other Ambulatory Visit (HOSPITAL_BASED_OUTPATIENT_CLINIC_OR_DEPARTMENT_OTHER): Payer: Medicare Other | Admitting: Lab

## 2011-05-17 VITALS — BP 144/75 | HR 83 | Temp 97.6°F

## 2011-05-17 DIAGNOSIS — C50919 Malignant neoplasm of unspecified site of unspecified female breast: Secondary | ICD-10-CM

## 2011-05-17 DIAGNOSIS — Z5112 Encounter for antineoplastic immunotherapy: Secondary | ICD-10-CM

## 2011-05-17 DIAGNOSIS — Z171 Estrogen receptor negative status [ER-]: Secondary | ICD-10-CM

## 2011-05-17 LAB — CBC WITH DIFFERENTIAL/PLATELET
Basophils Absolute: 0 10*3/uL (ref 0.0–0.1)
Eosinophils Absolute: 0.1 10*3/uL (ref 0.0–0.5)
HCT: 34.7 % — ABNORMAL LOW (ref 34.8–46.6)
HGB: 11.8 g/dL (ref 11.6–15.9)
LYMPH%: 57.4 % — ABNORMAL HIGH (ref 14.0–49.7)
MCV: 83 fL (ref 79.5–101.0)
MONO#: 0.5 10*3/uL (ref 0.1–0.9)
MONO%: 14 % (ref 0.0–14.0)
NEUT#: 0.8 10*3/uL — ABNORMAL LOW (ref 1.5–6.5)
NEUT%: 25.6 % — ABNORMAL LOW (ref 38.4–76.8)
Platelets: 169 10*3/uL (ref 145–400)
RBC: 4.18 10*6/uL (ref 3.70–5.45)

## 2011-05-17 LAB — COMPREHENSIVE METABOLIC PANEL
BUN: 11 mg/dL (ref 6–23)
CO2: 29 mEq/L (ref 19–32)
Calcium: 9.6 mg/dL (ref 8.4–10.5)
Chloride: 102 mEq/L (ref 96–112)
Creatinine, Ser: 0.7 mg/dL (ref 0.50–1.10)
Glucose, Bld: 90 mg/dL (ref 70–99)
Total Bilirubin: 0.3 mg/dL (ref 0.3–1.2)

## 2011-05-17 MED ORDER — ACETAMINOPHEN 325 MG PO TABS
650.0000 mg | ORAL_TABLET | Freq: Once | ORAL | Status: AC
  Administered 2011-05-17: 650 mg via ORAL

## 2011-05-17 MED ORDER — SODIUM CHLORIDE 0.9 % IV SOLN
Freq: Once | INTRAVENOUS | Status: AC
  Administered 2011-05-17: 14:00:00 via INTRAVENOUS

## 2011-05-17 MED ORDER — TRASTUZUMAB CHEMO INJECTION 440 MG
6.0000 mg/kg | Freq: Once | INTRAVENOUS | Status: AC
  Administered 2011-05-17: 399 mg via INTRAVENOUS
  Filled 2011-05-17: qty 19

## 2011-05-17 MED ORDER — DIPHENHYDRAMINE HCL 50 MG/ML IJ SOLN
12.5000 mg | Freq: Once | INTRAMUSCULAR | Status: AC
  Administered 2011-05-17: 12.5 mg via INTRAVENOUS

## 2011-05-17 MED ORDER — HEPARIN SOD (PORK) LOCK FLUSH 100 UNIT/ML IV SOLN
500.0000 [IU] | Freq: Once | INTRAVENOUS | Status: AC | PRN
  Administered 2011-05-17: 500 [IU]
  Filled 2011-05-17: qty 5

## 2011-05-17 MED ORDER — SODIUM CHLORIDE 0.9 % IJ SOLN
10.0000 mL | INTRAMUSCULAR | Status: DC | PRN
  Administered 2011-05-17: 10 mL
  Filled 2011-05-17: qty 10

## 2011-05-17 NOTE — Progress Notes (Signed)
ANC 0.8, per Dr Donnie Coffin, okay to tx with herceptin today.  SLJ

## 2011-05-18 ENCOUNTER — Encounter (INDEPENDENT_AMBULATORY_CARE_PROVIDER_SITE_OTHER): Payer: Self-pay | Admitting: General Surgery

## 2011-05-18 ENCOUNTER — Ambulatory Visit (INDEPENDENT_AMBULATORY_CARE_PROVIDER_SITE_OTHER): Payer: Medicare Other | Admitting: General Surgery

## 2011-05-18 VITALS — BP 134/80 | HR 100 | Temp 97.0°F | Resp 16 | Ht 64.0 in | Wt 135.6 lb

## 2011-05-18 DIAGNOSIS — C50919 Malignant neoplasm of unspecified site of unspecified female breast: Secondary | ICD-10-CM

## 2011-05-18 NOTE — Progress Notes (Signed)
Subjective:     Patient ID: Alyssa Jackson, female   DOB: [birth year], 66 y.o.   MRN: [id number]  HPI The patient is a 66 year old white female who is about 10 months out from a left mastectomy and negative sentinel node biopsy for a T2 N0 left breast cancer. She finished her regular chemotherapy in November and is still getting Herceptin. She feels well but does note about a 15 pound weight loss over the last year or so. Her bowel movements are still loose but controllable. Her main concern is that she is due for her mammogram in May and her cancer was never seen on her previous mammograms on 2 different occasions.  Review of Systems  Constitutional: Positive for unexpected weight change.  HENT: Negative.   Eyes: Negative.   Respiratory: Negative.   Cardiovascular: Negative.   Gastrointestinal: Positive for diarrhea.  Genitourinary: Negative.   Musculoskeletal: Negative.   Skin: Negative.   Neurological: Negative.   Hematological: Negative.   Psychiatric/Behavioral: Negative.        Objective:   Physical Exam  Constitutional: She is oriented to person, place, and time. She appears well-developed and well-nourished.  HENT:  Head: Normocephalic and atraumatic.  Eyes: Conjunctivae and EOM are normal. Pupils are equal, round, and reactive to light.  Neck: Normal range of motion. Neck supple.  Cardiovascular: Normal rate, regular rhythm and normal heart sounds.   Pulmonary/Chest: Effort normal and breath sounds normal.       There is no palpable mass of the left chest wall. There is no palpable mass in the right breast. No axillary supraclavicular or cervical lymphadenopathy.  Abdominal: Soft. Bowel sounds are normal. She exhibits no mass. There is no tenderness.  Musculoskeletal: Normal range of motion.  Lymphadenopathy:    She has no cervical adenopathy.  Neurological: She is alert and oriented to person, place, and time.  Skin: Skin is warm and dry.  Psychiatric: She has a normal  mood and affect. Her behavior is normal.       Assessment:     10 months status post left mastectomy and negative sentinel node biopsy    Plan:     She will continue to do regular self exams. We will try to order an MRI study to be done after her mammogram for the above reasons. We will plan to see her back in 3 months.

## 2011-05-18 NOTE — Patient Instructions (Signed)
Continue regular self exams  

## 2011-05-19 ENCOUNTER — Other Ambulatory Visit (INDEPENDENT_AMBULATORY_CARE_PROVIDER_SITE_OTHER): Payer: Self-pay

## 2011-05-19 ENCOUNTER — Other Ambulatory Visit (INDEPENDENT_AMBULATORY_CARE_PROVIDER_SITE_OTHER): Payer: Self-pay | Admitting: General Surgery

## 2011-05-19 DIAGNOSIS — Z853 Personal history of malignant neoplasm of breast: Secondary | ICD-10-CM

## 2011-05-19 DIAGNOSIS — C50919 Malignant neoplasm of unspecified site of unspecified female breast: Secondary | ICD-10-CM

## 2011-05-19 NOTE — Progress Notes (Signed)
Addended by: Charise Carwin on: 05/19/2011 11:29 AM   Modules accepted: Orders

## 2011-05-23 ENCOUNTER — Ambulatory Visit
Admission: RE | Admit: 2011-05-23 | Discharge: 2011-05-23 | Disposition: A | Discharge status: Home / Self Care | Payer: Medicare Other | Source: Ambulatory Visit | Attending: General Surgery | Admitting: General Surgery

## 2011-05-23 DIAGNOSIS — Z9012 Acquired absence of left breast and nipple: Secondary | ICD-10-CM

## 2011-05-30 ENCOUNTER — Ambulatory Visit
Admission: RE | Admit: 2011-05-30 | Discharge: 2011-05-30 | Disposition: A | Discharge status: Home / Self Care | Payer: Medicare Other | Source: Ambulatory Visit | Attending: General Surgery | Admitting: General Surgery

## 2011-05-30 DIAGNOSIS — Z853 Personal history of malignant neoplasm of breast: Secondary | ICD-10-CM

## 2011-05-30 MED ORDER — GADOBENATE DIMEGLUMINE 529 MG/ML IV SOLN
12.0000 mL | Freq: Once | INTRAVENOUS | Status: AC | PRN
  Administered 2011-05-30: 12 mL via INTRAVENOUS

## 2011-06-02 ENCOUNTER — Ambulatory Visit (HOSPITAL_COMMUNITY)
Admission: RE | Admit: 2011-06-02 | Discharge: 2011-06-02 | Disposition: A | Discharge status: Home / Self Care | Payer: Medicare Other | Source: Ambulatory Visit | Attending: Internal Medicine | Admitting: Internal Medicine

## 2011-06-02 VITALS — BP 104/62 | HR 73 | Wt 135.2 lb

## 2011-06-02 DIAGNOSIS — C50919 Malignant neoplasm of unspecified site of unspecified female breast: Secondary | ICD-10-CM

## 2011-06-02 DIAGNOSIS — Z9221 Personal history of antineoplastic chemotherapy: Secondary | ICD-10-CM | POA: Insufficient documentation

## 2011-06-02 DIAGNOSIS — Z09 Encounter for follow-up examination after completed treatment for conditions other than malignant neoplasm: Secondary | ICD-10-CM

## 2011-06-02 DIAGNOSIS — I509 Heart failure, unspecified: Secondary | ICD-10-CM | POA: Insufficient documentation

## 2011-06-02 NOTE — Progress Notes (Signed)
HPI:  Alyssa Jackson is a 66 y/o woman with asthma, HTN, DM2, hypothyroidism and breast CA referred by Dr. Donnie Coffin for cardiac monitoring during Herceptin therapy.  She was diagnosed with triple + L breast CA in 2004. Treated with lumpectomy + cytoxan/adriamycin/taxol + XRT at that time. On Arimidex 2004-2009. In March 2012 found to have a recurrence at the edge of her previous lumpectomy site. Underwent L mastectomy and May. Then treated with 1 round of taxotere/carboplatinum which had to be stopped due to severe diarrhea. Switched to abraxin/carbo/herceptin. Scheduled to get Herceptin q week for 18 weeks then q 3 weeks for 1 year.   Had MUGA 9/05 EF 61%. I have reviewed her echo from June 2012 (prior to any current chemo) with several colleagues and the consensus is that her EF is in the mildly depressed to low normal range range EF 45-50%. Lateral s' velocity 8.3 cm/sec.  She underwent f/u echo in August 2012 which I reviewed today EF 55-60% Lat s' 9.1 cm/sec. She also had cardiac MRI 10/2010: EF 56% no hyperenhancement ACE-I reduced due to cough by Dr. Donnie Coffin  Echo August 2012 55-60% lat s' 9.1 cm/sec Echo 12/2010 50-55% lateral s' ~8.6  (initially read as 10 cm/s but think this is artifact) Echo 02/24/11 EF 55-60% lateral s' 8.8 (poor window) Echo 06/02/11 EF 55% lateral s' 8.77 cm/sec  She is here for follow up today.  She is doing well.  Herceptin now q3 weeks and will continue through July or August this year.  Continues to have diarrhea after therapy  No CP/SOB/edema. No wheezing hasn't had to use albuterol in 2 weeks.  No dizziness/syncope.    ROS: All other systems normal except as mentioned in HPI, past medical history and problem list.    Past Medical History  Diagnosis Date  . Diabetes mellitus   . Hypertension   . Asthma   . Hyperlipidemia   . Incontinence   . Menopause   . Hormone replacement therapy (postmenopausal)   . Hypothyroidism   . Neutropenia     chroinic  . Anxiety   .  History of hysterectomy   . Cancer     breast cancer s/p tx on [facility] protocol 9831, completed radiation 02/12/03, on Arimidex from 12/16/02-01/12/08, with reoccurance in March 2012  . Breast cancer     Current Outpatient Prescriptions  Medication Sig Dispense Refill  . ALBUTEROL IN Inhale into the lungs. 17 grams 2 puffs twice a day for asthma       . aspirin 81 MG tablet Take 81 mg by mouth as needed. 2 capsules up to 4 x day       . Cholecalciferol (VITAMIN D PO) Take 5,000 mg by mouth daily.        Marland Kitchen esomeprazole (NEXIUM) 40 MG capsule Take 40 mg by mouth daily before breakfast.        . estradiol (ESTRING) 2 MG vaginal ring Place 2 mg vaginally. follow package directions       . levothyroxine (SYNTHROID, LEVOTHROID) 112 MCG tablet Take 112 mcg by mouth daily.        . metFORMIN (GLUCOPHAGE) 1000 MG tablet Take 1,000 mg by mouth 2 (two) times daily.        . montelukast (SINGULAIR) 10 MG tablet Take 10 mg by mouth daily.        Marland Kitchen omega-3 acid ethyl esters (LOVAZA) 1 G capsule Take 1 g by mouth 2 (two) times daily.        Marland Kitchen  quinapril (ACCUPRIL) 20 MG tablet Take 10 mg by mouth daily.       . traZODone (DESYREL) 50 MG tablet Take 1 tablet (50 mg total) by mouth at bedtime.  30 tablet  0  . triamcinolone (KENALOG) 0.1 % cream Apply topically 2 (two) times daily.        . VESICARE 10 MG tablet daily.      . prochlorperazine (COMPAZINE) 10 MG tablet Take 10 mg by mouth every 6 (six) hours as needed.         No current facility-administered medications for this encounter.   Facility-Administered Medications Ordered in Other Encounters  Medication Dose Route Frequency Provider Last Rate Last Dose  . sodium chloride 0.9 % injection 10 mL  10 mL Intracatheter PRN Amada Kingfisher, PA   10 mL at 01/11/11 1528  . sodium chloride 0.9 % injection 10 mL  10 mL Intracatheter PRN Pierce Crane, MD   10 mL at 03/15/11 1631  . sodium chloride 0.9 % injection 10 mL  10 mL Intracatheter PRN Pierce Crane, MD    10 mL at 05/17/11 1513     Allergies  Allergen Reactions  . Carboplatin     Positive skin test documented.  . Latex Other (See Comments)    Break out in blisters  . Penicillins     Since childhood  . Sulfa Drugs Cross Reactors Nausea And Vomiting  . Adhesive (Tape)   . Iodine Other (See Comments)    Gallbladder dye contrast tablets, pt states is not allergic to Iodine Has had other "dye" test with no problems.     PHYSICAL EXAM: Filed Vitals:   06/02/11 1034  BP: 104/62  Pulse: 73  Weight: 135 lb 4 oz (61.349 kg)  SpO2: 100%    General:  Well appearing. No respiratory difficulty HEENT:alopecic  normal. Neck: supple. no JVD. Carotids 2+ bilat; no bruits. No lymphadenopathy or thryomegaly appreciated. Cor: PMI nondisplaced. Regular rate & rhythm. No rubs, gallops or murmurs. Lungs: clear.  Abdomen: soft, nontender, nondistended. No hepatosplenomegaly. No bruits or masses. Good bowel sounds. Extremities: no cyanosis, clubbing, rash, edema.  Port-a-cath st subclavian Neuro: alert & oriented x 3, cranial nerves grossly intact. moves all 4 extremities w/o difficulty. Affect pleasant.   ASSESSMENT & PLAN:

## 2011-06-02 NOTE — Assessment & Plan Note (Addendum)
Doing well.  Echos reviewed in full.  Ok to continue herceptin therapy.  Follow up 3 months with echo.    Patient seen and examined with Ulyess Blossom PA-C. We discussed all aspects of the encounter. I agree with the assessment and plan as stated above. Echos reviewed - all parameters stable. No HF on exam. Continue Herceptin.

## 2011-06-02 NOTE — Progress Notes (Signed)
  Echocardiogram 2D Echocardiogram has been performed.  Alyssa Jackson A 06/02/2011, 10:49 AM

## 2011-06-02 NOTE — Patient Instructions (Signed)
Follow up 3 months with echo.  Your physician has requested that you have an echocardiogram. Echocardiography is a painless test that uses sound waves to create images of your heart. It provides your doctor with information about the size and shape of your heart and how well your heart's chambers and valves are working. This procedure takes approximately one hour. There are no restrictions for this procedure.   

## 2011-06-06 ENCOUNTER — Other Ambulatory Visit: Payer: Self-pay | Admitting: Oncology

## 2011-06-06 DIAGNOSIS — E559 Vitamin D deficiency, unspecified: Secondary | ICD-10-CM

## 2011-06-06 DIAGNOSIS — C50919 Malignant neoplasm of unspecified site of unspecified female breast: Secondary | ICD-10-CM

## 2011-06-07 ENCOUNTER — Encounter: Payer: Self-pay | Admitting: Physician Assistant

## 2011-06-07 ENCOUNTER — Other Ambulatory Visit (HOSPITAL_BASED_OUTPATIENT_CLINIC_OR_DEPARTMENT_OTHER): Payer: Medicare Other | Admitting: Lab

## 2011-06-07 ENCOUNTER — Ambulatory Visit (HOSPITAL_BASED_OUTPATIENT_CLINIC_OR_DEPARTMENT_OTHER): Payer: Medicare Other | Admitting: Physician Assistant

## 2011-06-07 ENCOUNTER — Ambulatory Visit (HOSPITAL_BASED_OUTPATIENT_CLINIC_OR_DEPARTMENT_OTHER): Payer: Medicare Other

## 2011-06-07 VITALS — BP 124/81 | HR 90 | Temp 98.3°F | Ht 64.0 in | Wt 134.1 lb

## 2011-06-07 DIAGNOSIS — C50919 Malignant neoplasm of unspecified site of unspecified female breast: Secondary | ICD-10-CM

## 2011-06-07 DIAGNOSIS — E559 Vitamin D deficiency, unspecified: Secondary | ICD-10-CM

## 2011-06-07 DIAGNOSIS — Z171 Estrogen receptor negative status [ER-]: Secondary | ICD-10-CM

## 2011-06-07 DIAGNOSIS — E119 Type 2 diabetes mellitus without complications: Secondary | ICD-10-CM

## 2011-06-07 DIAGNOSIS — C50219 Malignant neoplasm of upper-inner quadrant of unspecified female breast: Secondary | ICD-10-CM

## 2011-06-07 DIAGNOSIS — C50912 Malignant neoplasm of unspecified site of left female breast: Secondary | ICD-10-CM

## 2011-06-07 DIAGNOSIS — Z5112 Encounter for antineoplastic immunotherapy: Secondary | ICD-10-CM

## 2011-06-07 LAB — CBC WITH DIFFERENTIAL/PLATELET
Basophils Absolute: 0 10*3/uL (ref 0.0–0.1)
Eosinophils Absolute: 0.1 10*3/uL (ref 0.0–0.5)
HCT: 36.2 % (ref 34.8–46.6)
HGB: 12.3 g/dL (ref 11.6–15.9)
LYMPH%: 51.2 % — ABNORMAL HIGH (ref 14.0–49.7)
MONO#: 0.6 10*3/uL (ref 0.1–0.9)
NEUT#: 1.3 10*3/uL — ABNORMAL LOW (ref 1.5–6.5)
NEUT%: 31.8 % — ABNORMAL LOW (ref 38.4–76.8)
Platelets: 197 10*3/uL (ref 145–400)
WBC: 4.1 10*3/uL (ref 3.9–10.3)

## 2011-06-07 LAB — COMPREHENSIVE METABOLIC PANEL
CO2: 28 mEq/L (ref 19–32)
Creatinine, Ser: 0.74 mg/dL (ref 0.50–1.10)
Glucose, Bld: 94 mg/dL (ref 70–99)
Sodium: 139 mEq/L (ref 135–145)
Total Bilirubin: 0.3 mg/dL (ref 0.3–1.2)
Total Protein: 6.7 g/dL (ref 6.0–8.3)

## 2011-06-07 MED ORDER — ACETAMINOPHEN 325 MG PO TABS
650.0000 mg | ORAL_TABLET | Freq: Once | ORAL | Status: AC
  Administered 2011-06-07: 650 mg via ORAL

## 2011-06-07 MED ORDER — TRASTUZUMAB CHEMO INJECTION 440 MG
6.0000 mg/kg | Freq: Once | INTRAVENOUS | Status: AC
  Administered 2011-06-07: 399 mg via INTRAVENOUS
  Filled 2011-06-07: qty 19

## 2011-06-07 MED ORDER — DIPHENHYDRAMINE HCL 50 MG/ML IJ SOLN
12.5000 mg | Freq: Once | INTRAMUSCULAR | Status: AC
  Administered 2011-06-07: 12.5 mg via INTRAVENOUS

## 2011-06-07 MED ORDER — SODIUM CHLORIDE 0.9 % IJ SOLN
10.0000 mL | INTRAMUSCULAR | Status: DC | PRN
  Administered 2011-06-07: 10 mL
  Filled 2011-06-07: qty 10

## 2011-06-07 MED ORDER — HEPARIN SOD (PORK) LOCK FLUSH 100 UNIT/ML IV SOLN
500.0000 [IU] | Freq: Once | INTRAVENOUS | Status: AC | PRN
  Administered 2011-06-07: 500 [IU]
  Filled 2011-06-07: qty 5

## 2011-06-07 MED ORDER — SODIUM CHLORIDE 0.9 % IV SOLN
Freq: Once | INTRAVENOUS | Status: AC
  Administered 2011-06-07: 16:00:00 via INTRAVENOUS

## 2011-06-07 NOTE — Progress Notes (Signed)
Hematology and Oncology Follow Up Visit  Alyssa Jackson [id number] 1945-11-25 66 y.o. 06/07/2011    HPI:  A 66 year old [place], [name] with recurrent breast carcinoma, ER/PR negative, HER-2 positive, status post left simple mastectomy with sentinel node dissection on 08/04/2010, status post 6 cycles of Abraxane/Herceptin.  1. Prior history of a HER-2 positive, ER/PR positive left breast carcinoma treated according to [facility] 9831 protocol with completion of radiation on 02/12/2003, Arimidex between 12/16/2002 and 01/12/2008.  2. Type 2 diabetes.  3. History of hypothyroidism.  4. History of chronic neutropenia.  5. History of chronic anxiety and hot flashes.  6. History of chronic left shoulder pain.   Interim History:   Alyssa Jackson is seen today prior to initiating her next q. 3 week doses maintenance Herceptin. She denies any unexplained fevers, chills, nausea issues. She does have intermittent diarrhea stools that seemed to correlate with her single agent Herceptin infusion. She denies shortness of breath or chest pain. Of note a 2-D echocardiogram under the care of Dr. Kittie Plater was performed on 06/02/2011.  She also had her annual mammogram of the right breast and right-sided breast MRI performed in March of 2013.  A detailed review of systems is otherwise noncontributory as noted below.  Review of Systems: Constitutional:  no weight loss, fever, night sweats and feels well Eyes: uses glasses ENT: no complaints Cardiovascular: no chest pain or dyspnea on exertion Respiratory: no cough, shortness of breath, or wheezing Neurological: no TIA or stroke symptoms Dermatological: negative Gastrointestinal: positive for - diarrhea, which is intermittent. Genito-Urinary: no dysuria, trouble voiding, or hematuria Hematological and Lymphatic: negative Breast: negative Musculoskeletal: negative Remaining ROS negative.  Medications:   I have reviewed the patient's current  medications.  Current Outpatient Prescriptions  Medication Sig Dispense Refill  . ALBUTEROL IN Inhale into the lungs. 17 grams 2 puffs twice a day for asthma       . aspirin 81 MG tablet Take 81 mg by mouth as needed. 2 capsules up to 4 x day       . Cholecalciferol (VITAMIN D PO) Take 5,000 mg by mouth daily.        Marland Kitchen esomeprazole (NEXIUM) 40 MG capsule Take 40 mg by mouth daily before breakfast.        . estradiol (ESTRING) 2 MG vaginal ring Place 2 mg vaginally. follow package directions       . levothyroxine (SYNTHROID, LEVOTHROID) 112 MCG tablet Take 112 mcg by mouth daily.        . metFORMIN (GLUCOPHAGE) 1000 MG tablet Take 1,000 mg by mouth 2 (two) times daily.        . montelukast (SINGULAIR) 10 MG tablet Take 10 mg by mouth daily.        Marland Kitchen omega-3 acid ethyl esters (LOVAZA) 1 G capsule Take 1 g by mouth 2 (two) times daily.        . prochlorperazine (COMPAZINE) 10 MG tablet Take 10 mg by mouth every 6 (six) hours as needed.        . quinapril (ACCUPRIL) 20 MG tablet Take 10 mg by mouth daily.       . traZODone (DESYREL) 50 MG tablet Take 1 tablet (50 mg total) by mouth at bedtime.  30 tablet  0  . triamcinolone (KENALOG) 0.1 % cream Apply topically 2 (two) times daily.        . VESICARE 10 MG tablet daily.       No current facility-administered medications for this  visit.   Facility-Administered Medications Ordered in Other Visits  Medication Dose Route Frequency Provider Last Rate Last Dose  . sodium chloride 0.9 % injection 10 mL  10 mL Intracatheter PRN Amada Kingfisher, PA   10 mL at 01/11/11 1528  . sodium chloride 0.9 % injection 10 mL  10 mL Intracatheter PRN Pierce Crane, MD   10 mL at 03/15/11 1631  . sodium chloride 0.9 % injection 10 mL  10 mL Intracatheter PRN Pierce Crane, MD   10 mL at 05/17/11 1513    Allergies:  Allergies  Allergen Reactions  . Carboplatin     Positive skin test documented.  . Latex Other (See Comments)    Break out in blisters  .  Penicillins     Since childhood  . Sulfa Drugs Cross Reactors Nausea And Vomiting  . Adhesive (Tape)   . Iodine Other (See Comments)    Gallbladder dye contrast tablets, pt states is not allergic to Iodine Has had other "dye" test with no problems.    Physical Exam: Filed Vitals:   06/07/11 1324  BP: 124/81  Pulse: 90  Temp: 98.3 F (36.8 C)  Weight: 134 lbs. HEENT:  Sclerae anicteric, conjunctivae pink.  Oropharynx clear.  No mucositis or candidiasis.   Nodes:  No cervical, supraclavicular, or axillary lymphadenopathy palpated.  Breast Exam:  Deferred.   Lungs:  Clear to auscultation bilaterally.  No crackles, rhonchi, or wheezes.   Heart:  Regular rate and rhythm.   Abdomen:  Soft, nontender.  Positive bowel sounds.  No organomegaly or masses palpated.   Musculoskeletal:  No focal spinal tenderness to palpation.  Extremities:  Benign.  No peripheral edema or cyanosis,  resolving ecchymosis of the left lower extremity from injury to the small toe. Skin:  Benign.   Neuro:  Nonfocal, alert and oriented x 3.   Lab Results: Lab Results  Component Value Date   WBC 4.1 06/07/2011   HGB 12.3 06/07/2011   HCT 36.2 06/07/2011   MCV 82.5 06/07/2011   PLT 197 06/07/2011   NEUTROABS 1.3* 06/07/2011     Chemistry      Component Value Date/Time   NA 139 05/17/2011 1309   K 3.8 05/17/2011 1309   CL 102 05/17/2011 1309   CO2 29 05/17/2011 1309   BUN 11 05/17/2011 1309   CREATININE 0.70 05/17/2011 1309   GLU 161* 06/16/2010 0908      Component Value Date/Time   CALCIUM 9.6 05/17/2011 1309   ALKPHOS 40 05/17/2011 1309   AST 19 05/17/2011 1309   ALT 15 05/17/2011 1309   BILITOT 0.3 05/17/2011 1309     2D Echocardiogram: 06/02/11 Study Conclusions Left ventricle: Abnormal septal motion The cavity size was normal. Systolic function was normal. The estimated ejection fraction was 55%. Wall motion was normal; there were no regional wall motion abnormalities. Transthoracic echocardiography. M-mode,  complete 2D, spectral Doppler, and color Doppler. Height: Height: 162.6cm. Height: 64in. Weight: Weight: 59kg. Weight: 129.7lb. Body mass index: BMI: 22.3kg/m^2. Body surface area: BSA: 1.52m^2. Blood pressure: 143/82. Patient status: Outpatient. Location: Echo laboratory. Prepared and Electronically Authenticated by Charlton Haws   Radiographic data: 05/30/11 BILATERAL BREAST MRI WITH AND WITHOUT CONTRAST  Technique: Multiplanar, multisequence MR images of both breasts were obtained prior to and following the intravenous administration of 12ml of Multihance. Three dimensional images were evaluated at the independent DynaCad workstation.  Comparison: Multiple priors most recently 05/27/2010  Findings: The patient has had a left mastectomy. Foci  of  enhancement noted in the right breast. No suspicious mass or  enhancement is seen in the right breast. There is no axillary or  internal mammary adenopathy.  A trace right pleural effusion is seen. Benign cyst versus  hemangioma is noted in the liver near the dome.  IMPRESSION:  No MRI specific evidence of malignancy, right breast. Left  mastectomy. Screening mammography is recommended in 1 year. BI-RADS CATEGORY 1: Negative.  Original Report Authenticated By: Hiram Gash, M.D.  05/23/11 RIGHT DIGITAL SCREENING MAMMOGRAM WITH CAD:  The patient has had a left mastectomy. The breast tissue is heterogeneously dense. No masses or malignant type calcifications are identified. Compared with prior studies.  Images were processed with CAD.  IMPRESSION:  No specific mammographic evidence of malignancy. Next screening mammogram is recommended in one  year.  A result letter of this screening mammogram will be mailed directly to the patient.  ASSESSMENT: Negative - BI-RADS 1  Screening mammogram in 1 year. Signed Date/Time  Phone Pager [place], Carmon Ginsberg [place]     Assessment:  A 66 year old [place], [name] with  recurrent  ER/PR negative, HER-2 positive left breast carcinoma, due for next q.3-week dose of single-agent Herceptin.   Case reviewed with Dr. Pierce Crane.  Plan:  Mashonda will receive Herceptin today as scheduled. Her Benadryl at 12.5 mg. In 3 weeks she will return for Herceptin alone, at 6 weeks for followup exam and Herceptin. Francelia knows we would be happy to see her prior to her followup if the need should arise.   This plan was reviewed with the patient, who voices understanding and agreement.  She knows to call with any changes or problems.    Ramatoulaye Pack T, PA-C 06/07/2011

## 2011-06-08 LAB — VITAMIN D 25 HYDROXY (VIT D DEFICIENCY, FRACTURES): Vit D, 25-Hydroxy: 70 ng/mL (ref 30–89)

## 2011-06-13 ENCOUNTER — Other Ambulatory Visit: Payer: Self-pay | Admitting: Certified Registered Nurse Anesthetist

## 2011-06-28 ENCOUNTER — Ambulatory Visit (HOSPITAL_BASED_OUTPATIENT_CLINIC_OR_DEPARTMENT_OTHER): Payer: Medicare Other

## 2011-06-28 ENCOUNTER — Other Ambulatory Visit (HOSPITAL_BASED_OUTPATIENT_CLINIC_OR_DEPARTMENT_OTHER): Payer: Medicare Other | Admitting: Lab

## 2011-06-28 VITALS — BP 128/71 | HR 84 | Temp 98.0°F

## 2011-06-28 DIAGNOSIS — C50919 Malignant neoplasm of unspecified site of unspecified female breast: Secondary | ICD-10-CM

## 2011-06-28 DIAGNOSIS — C50219 Malignant neoplasm of upper-inner quadrant of unspecified female breast: Secondary | ICD-10-CM

## 2011-06-28 DIAGNOSIS — Z5112 Encounter for antineoplastic immunotherapy: Secondary | ICD-10-CM

## 2011-06-28 DIAGNOSIS — C50912 Malignant neoplasm of unspecified site of left female breast: Secondary | ICD-10-CM

## 2011-06-28 LAB — CBC WITH DIFFERENTIAL/PLATELET
BASO%: 0.3 % (ref 0.0–2.0)
HCT: 37.1 % (ref 34.8–46.6)
MCHC: 34.5 g/dL (ref 31.5–36.0)
MONO#: 0.4 10*3/uL (ref 0.1–0.9)
NEUT%: 21 % — ABNORMAL LOW (ref 38.4–76.8)
WBC: 3.8 10*3/uL — ABNORMAL LOW (ref 3.9–10.3)
lymph#: 2.4 10*3/uL (ref 0.9–3.3)
nRBC: 0 % (ref 0–0)

## 2011-06-28 MED ORDER — SODIUM CHLORIDE 0.9 % IV SOLN
Freq: Once | INTRAVENOUS | Status: AC
  Administered 2011-06-28: 14:00:00 via INTRAVENOUS

## 2011-06-28 MED ORDER — TRASTUZUMAB CHEMO INJECTION 440 MG
6.0000 mg/kg | Freq: Once | INTRAVENOUS | Status: AC
  Administered 2011-06-28: 357 mg via INTRAVENOUS
  Filled 2011-06-28: qty 17

## 2011-06-28 MED ORDER — ACETAMINOPHEN 325 MG PO TABS
650.0000 mg | ORAL_TABLET | Freq: Once | ORAL | Status: AC
  Administered 2011-06-28: 650 mg via ORAL

## 2011-06-28 MED ORDER — HEPARIN SOD (PORK) LOCK FLUSH 100 UNIT/ML IV SOLN
500.0000 [IU] | Freq: Once | INTRAVENOUS | Status: AC | PRN
  Administered 2011-06-28: 500 [IU]
  Filled 2011-06-28: qty 5

## 2011-06-28 MED ORDER — DIPHENHYDRAMINE HCL 50 MG/ML IJ SOLN
12.5000 mg | Freq: Once | INTRAMUSCULAR | Status: AC
  Administered 2011-06-28: 12.5 mg via INTRAVENOUS

## 2011-06-28 MED ORDER — SODIUM CHLORIDE 0.9 % IJ SOLN
10.0000 mL | INTRAMUSCULAR | Status: DC | PRN
  Administered 2011-06-28: 10 mL
  Filled 2011-06-28: qty 10

## 2011-06-28 NOTE — Patient Instructions (Signed)
[facility] Discharge Instructions for Patients Receiving Chemotherapy  Today you received the following chemotherapy agents Herceptin.  To help prevent nausea and vomiting after your treatment, we encourage you to take your nausea medication as instructed by your physician. Begin taking it as often as prescribed.   If you develop nausea and vomiting that is not controlled by your nausea medication, call the clinic. If it is after clinic hours your family physician or the after hours number for the clinic or go to the Emergency Department.   BELOW ARE SYMPTOMS THAT SHOULD BE REPORTED IMMEDIATELY:  *FEVER GREATER THAN 100.5 F  *CHILLS WITH OR WITHOUT FEVER  NAUSEA AND VOMITING THAT IS NOT CONTROLLED WITH YOUR NAUSEA MEDICATION  *UNUSUAL SHORTNESS OF BREATH  *UNUSUAL BRUISING OR BLEEDING  TENDERNESS IN MOUTH AND THROAT WITH OR WITHOUT PRESENCE OF ULCERS  *URINARY PROBLEMS  *BOWEL PROBLEMS  UNUSUAL RASH Items with * indicate a potential emergency and should be followed up as soon as possible.  One of the nurses will contact you 24 hours after your treatment. Please let the nurse know about any problems that you may have experienced. Feel free to call the clinic you have any questions or concerns. The clinic phone number is [phone].   I have been informed and understand all the instructions given to me. I know to contact the clinic, my physician, or go to the Emergency Department if any problems should occur. I do not have any questions at this time, but understand that I may call the clinic during office hours   should I have any questions or need assistance in obtaining follow up care.    __________________________________________  _____________  __________ Signature of Patient or Authorized Representative            Date                   Time    __________________________________________ Nurse's Signature    

## 2011-06-28 NOTE — Progress Notes (Signed)
1340 Ok to treat per Dr. Donnie Coffin with Neut @ 0.8

## 2011-07-19 ENCOUNTER — Ambulatory Visit (HOSPITAL_BASED_OUTPATIENT_CLINIC_OR_DEPARTMENT_OTHER): Payer: Medicare Other | Admitting: Physician Assistant

## 2011-07-19 ENCOUNTER — Ambulatory Visit (HOSPITAL_BASED_OUTPATIENT_CLINIC_OR_DEPARTMENT_OTHER): Payer: Medicare Other

## 2011-07-19 ENCOUNTER — Other Ambulatory Visit (HOSPITAL_BASED_OUTPATIENT_CLINIC_OR_DEPARTMENT_OTHER): Payer: Medicare Other | Admitting: Lab

## 2011-07-19 VITALS — BP 143/84 | HR 84 | Temp 97.8°F | Ht 64.0 in | Wt 132.4 lb

## 2011-07-19 DIAGNOSIS — Z9010 Acquired absence of unspecified breast and nipple: Secondary | ICD-10-CM

## 2011-07-19 DIAGNOSIS — C50919 Malignant neoplasm of unspecified site of unspecified female breast: Secondary | ICD-10-CM

## 2011-07-19 DIAGNOSIS — Z5112 Encounter for antineoplastic immunotherapy: Secondary | ICD-10-CM

## 2011-07-19 DIAGNOSIS — C50912 Malignant neoplasm of unspecified site of left female breast: Secondary | ICD-10-CM

## 2011-07-19 DIAGNOSIS — Z171 Estrogen receptor negative status [ER-]: Secondary | ICD-10-CM

## 2011-07-19 LAB — COMPREHENSIVE METABOLIC PANEL
Albumin: 3.8 g/dL (ref 3.5–5.2)
CO2: 28 mEq/L (ref 19–32)
Chloride: 105 mEq/L (ref 96–112)
Glucose, Bld: 98 mg/dL (ref 70–99)
Potassium: 3.7 mEq/L (ref 3.5–5.3)
Sodium: 142 mEq/L (ref 135–145)
Total Protein: 6.2 g/dL (ref 6.0–8.3)

## 2011-07-19 LAB — CBC WITH DIFFERENTIAL/PLATELET
Basophils Absolute: 0 10*3/uL (ref 0.0–0.1)
Eosinophils Absolute: 0.1 10*3/uL (ref 0.0–0.5)
HGB: 12.4 g/dL (ref 11.6–15.9)
MCV: 83 fL (ref 79.5–101.0)
MONO#: 0.5 10*3/uL (ref 0.1–0.9)
NEUT#: 0.9 10*3/uL — ABNORMAL LOW (ref 1.5–6.5)
RDW: 13.8 % (ref 11.2–14.5)
WBC: 3.8 10*3/uL — ABNORMAL LOW (ref 3.9–10.3)
lymph#: 2.3 10*3/uL (ref 0.9–3.3)

## 2011-07-19 LAB — LACTATE DEHYDROGENASE: LDH: 139 U/L (ref 94–250)

## 2011-07-19 MED ORDER — ACETAMINOPHEN 325 MG PO TABS
650.0000 mg | ORAL_TABLET | Freq: Once | ORAL | Status: AC
  Administered 2011-07-19: 650 mg via ORAL

## 2011-07-19 MED ORDER — TRASTUZUMAB CHEMO INJECTION 440 MG
6.0000 mg/kg | Freq: Once | INTRAVENOUS | Status: AC
  Administered 2011-07-19: 357 mg via INTRAVENOUS
  Filled 2011-07-19: qty 17

## 2011-07-19 MED ORDER — HEPARIN SOD (PORK) LOCK FLUSH 100 UNIT/ML IV SOLN
500.0000 [IU] | Freq: Once | INTRAVENOUS | Status: AC | PRN
  Administered 2011-07-19: 500 [IU]
  Filled 2011-07-19: qty 5

## 2011-07-19 MED ORDER — SODIUM CHLORIDE 0.9 % IV SOLN
Freq: Once | INTRAVENOUS | Status: AC
  Administered 2011-07-19: 15:00:00 via INTRAVENOUS

## 2011-07-19 MED ORDER — SODIUM CHLORIDE 0.9 % IJ SOLN
10.0000 mL | INTRAMUSCULAR | Status: DC | PRN
  Administered 2011-07-19: 10 mL
  Filled 2011-07-19: qty 10

## 2011-07-19 NOTE — Progress Notes (Signed)
Hematology and Oncology Follow Up Visit  Alyssa Jackson [id number] 1946-02-14 66 y.o. 07/19/2011    HPI:  A 66 year old [place], [name] with recurrent breast carcinoma, ER/PR negative, HER-2 positive, status post left simple mastectomy with sentinel node dissection on 08/04/2010, status post 6 cycles of Abraxane/Herceptin.   1. Prior history of a HER-2 positive, ER/PR positive left breast carcinoma treated according to [facility] 9831 protocol with completion of radiation on 02/12/2003, Arimidex between 12/16/2002 and 01/12/2008.   2. Type 2 diabetes.   3. History of hypothyroidism.   4. History of chronic neutropenia.   5. History of chronic anxiety and hot flashes.   6. History of chronic left shoulder pain.   Interim History:   Akia is seen today prior to initiating her next q. 3 week doses maintenance Herceptin. She denies any unexplained fevers, chills, nausea issues. She does have intermittent diarrhea stools that seemed to correlate with her single agent Herceptin infusion. She denies shortness of breath or chest pain. A detailed review of systems is otherwise noncontributory as noted below.  Review of Systems: Constitutional:  no weight loss, fever, night sweats and feels well Eyes: uses glasses ENT: no complaints Cardiovascular: no chest pain or dyspnea on exertion Respiratory: no cough, shortness of breath, or wheezing Neurological: no TIA or stroke symptoms Dermatological: negative Gastrointestinal: positive for - diarrhea, which is intermittent. Genito-Urinary: no dysuria, trouble voiding, or hematuria Hematological and Lymphatic: negative Breast: negative Musculoskeletal: negative Remaining ROS negative.  Medications:   I have reviewed the patient's current medications.  Current Outpatient Prescriptions  Medication Sig Dispense Refill  . ALBUTEROL IN Inhale into the lungs. 17 grams 2 puffs twice a day for asthma       . aspirin 81 MG tablet Take  81 mg by mouth as needed. 2 capsules up to 4 x day       . Cholecalciferol (VITAMIN D PO) Take 5,000 mg by mouth daily.        Marland Kitchen esomeprazole (NEXIUM) 40 MG capsule Take 40 mg by mouth daily before breakfast.        . estradiol (ESTRING) 2 MG vaginal ring Place 2 mg vaginally. follow package directions       . levothyroxine (SYNTHROID, LEVOTHROID) 112 MCG tablet Take 112 mcg by mouth daily.        . metFORMIN (GLUCOPHAGE) 1000 MG tablet Take 1,000 mg by mouth 2 (two) times daily.        . montelukast (SINGULAIR) 10 MG tablet Take 10 mg by mouth daily.        Marland Kitchen omega-3 acid ethyl esters (LOVAZA) 1 G capsule Take 1 g by mouth 2 (two) times daily.        . prochlorperazine (COMPAZINE) 10 MG tablet Take 10 mg by mouth every 6 (six) hours as needed.        . quinapril (ACCUPRIL) 20 MG tablet Take 10 mg by mouth daily.       . traZODone (DESYREL) 50 MG tablet Take 1 tablet (50 mg total) by mouth at bedtime.  30 tablet  0  . triamcinolone (KENALOG) 0.1 % cream Apply topically 2 (two) times daily.        . VESICARE 10 MG tablet daily.       No current facility-administered medications for this visit.   Facility-Administered Medications Ordered in Other Visits  Medication Dose Route Frequency Provider Last Rate Last Dose  . 0.9 %  sodium chloride infusion   Intravenous  Once Pierce Crane, MD 20 mL/hr at 07/19/11 1500    . acetaminophen (TYLENOL) tablet 650 mg  650 mg Oral Once Pierce Crane, MD   650 mg at 07/19/11 1502  . heparin lock flush 100 unit/mL  500 Units Intracatheter Once PRN Pierce Crane, MD      . sodium chloride 0.9 % injection 10 mL  10 mL Intracatheter PRN Amada Kingfisher, PA   10 mL at 01/11/11 1528  . sodium chloride 0.9 % injection 10 mL  10 mL Intracatheter PRN Pierce Crane, MD   10 mL at 03/15/11 1631  . sodium chloride 0.9 % injection 10 mL  10 mL Intracatheter PRN Pierce Crane, MD   10 mL at 05/17/11 1513  . sodium chloride 0.9 % injection 10 mL  10 mL Intracatheter PRN Pierce Crane, MD   10 mL at 06/07/11 1631  . sodium chloride 0.9 % injection 10 mL  10 mL Intracatheter PRN Pierce Crane, MD      . trastuzumab (HERCEPTIN) 357 mg in sodium chloride 0.9 % 250 mL chemo infusion  6 mg/kg (Treatment Plan Actual) Intravenous Once Pierce Crane, MD 534 mL/hr at 07/19/11 1534 357 mg at 07/19/11 1534    Allergies:  Allergies  Allergen Reactions  . Carboplatin     Positive skin test documented.  . Latex Other (See Comments)    Break out in blisters  . Penicillins     Since childhood  . Sulfa Drugs Cross Reactors Nausea And Vomiting  . Adhesive (Tape)   . Iodine Other (See Comments)    Gallbladder dye contrast tablets, pt states is not allergic to Iodine Has had other "dye" test with no problems.    Physical Exam: Filed Vitals:   07/19/11 1308  BP: 143/84  Pulse: 84  Temp: 97.8 F (36.6 C)  Weight: 132 lbs. HEENT:  Sclerae anicteric, conjunctivae pink.  Oropharynx clear.  No mucositis or candidiasis.   Nodes:  No cervical, supraclavicular, or axillary lymphadenopathy palpated.  Breast Exam:  Deferred.   Lungs:  Clear to auscultation bilaterally.  No crackles, rhonchi, or wheezes.   Heart:  Regular rate and rhythm.   Abdomen:  Soft, nontender.  Positive bowel sounds.  No organomegaly or masses palpated.   Musculoskeletal:  No focal spinal tenderness to palpation.  Extremities:  Benign.  No peripheral edema or cyanosis,  resolving ecchymosis of the left lower extremity from injury to the small toe. Skin:  Benign.   Neuro:  Nonfocal, alert and oriented x 3.   Lab Results: Lab Results  Component Value Date   WBC 3.8* 07/19/2011   HGB 12.4 07/19/2011   HCT 36.1 07/19/2011   MCV 83.0 07/19/2011   PLT 196 07/19/2011   NEUTROABS 0.9* 07/19/2011     Chemistry      Component Value Date/Time   NA 139 06/07/2011 1314   K 4.2 06/07/2011 1314   CL 101 06/07/2011 1314   CO2 28 06/07/2011 1314   BUN 14 06/07/2011 1314   CREATININE 0.74 06/07/2011 1314   GLU 161* 06/16/2010  0908      Component Value Date/Time   CALCIUM 9.7 06/07/2011 1314   ALKPHOS 48 06/07/2011 1314   AST 20 06/07/2011 1314   ALT 15 06/07/2011 1314   BILITOT 0.3 06/07/2011 1314     2D Echocardiogram: 06/02/11 Study Conclusions Left ventricle: Abnormal septal motion The cavity size was normal. Systolic function was normal. The estimated ejection fraction was 55%. Wall motion was  normal; there were no regional wall motion abnormalities. Transthoracic echocardiography. M-mode, complete 2D, spectral Doppler, and color Doppler. Height: Height: 162.6cm. Height: 64in. Weight: Weight: 59kg. Weight: 129.7lb. Body mass index: BMI: 22.3kg/m^2. Body surface area: BSA: 1.53m^2. Blood pressure: 143/82. Patient status: Outpatient. Location: Echo laboratory. Prepared and Electronically Authenticated by Charlton Haws   Radiographic data: 05/30/11 BILATERAL BREAST MRI WITH AND WITHOUT CONTRAST  Technique: Multiplanar, multisequence MR images of both breasts were obtained prior to and following the intravenous administration of 12ml of Multihance. Three dimensional images were evaluated at the independent DynaCad workstation.  Comparison: Multiple priors most recently 05/27/2010  Findings: The patient has had a left mastectomy. Foci of  enhancement noted in the right breast. No suspicious mass or  enhancement is seen in the right breast. There is no axillary or  internal mammary adenopathy.  A trace right pleural effusion is seen. Benign cyst versus  hemangioma is noted in the liver near the dome.  IMPRESSION:  No MRI specific evidence of malignancy, right breast. Left  mastectomy. Screening mammography is recommended in 1 year. BI-RADS CATEGORY 1: Negative.  Original Report Authenticated By: Hiram Gash, M.D.  05/23/11 RIGHT DIGITAL SCREENING MAMMOGRAM WITH CAD:  The patient has had a left mastectomy. The breast tissue is heterogeneously dense. No masses or malignant type calcifications are  identified. Compared with prior studies.  Images were processed with CAD.  IMPRESSION:  No specific mammographic evidence of malignancy. Next screening mammogram is recommended in one  year.  A result letter of this screening mammogram will be mailed directly to the patient.  ASSESSMENT: Negative - BI-RADS 1  Screening mammogram in 1 year. Signed Date/Time  Phone Pager [place], Carmon Ginsberg [place]     Assessment:  A 66 year old [place], [name] with  recurrent ER/PR negative, HER-2 positive left breast carcinoma, due for next q.3-week dose of single-agent Herceptin.   Case to be reviewed with Dr. Pierce Crane.  Plan:  Kierah will receive Herceptin today as scheduled. Her Benadryl will be held since she took 25mg  orally within the past 2 hours.  In 3 weeks she will return for Herceptin alone, at 6 weeks for followup exam and Herceptin. She is already scheduled for followup within the CHF clinic in 08/2011. Dayrin knows we would be happy to see her prior to her followup if the need should arise.   This plan was reviewed with the patient, who voices understanding and agreement.  She knows to call with any changes or problems.    Shawni Volkov T, PA-C 07/19/2011

## 2011-08-09 ENCOUNTER — Ambulatory Visit (HOSPITAL_BASED_OUTPATIENT_CLINIC_OR_DEPARTMENT_OTHER): Payer: Medicare Other

## 2011-08-09 ENCOUNTER — Other Ambulatory Visit (HOSPITAL_BASED_OUTPATIENT_CLINIC_OR_DEPARTMENT_OTHER): Payer: Medicare Other | Admitting: Lab

## 2011-08-09 VITALS — BP 155/88 | HR 58 | Temp 98.2°F

## 2011-08-09 DIAGNOSIS — C50919 Malignant neoplasm of unspecified site of unspecified female breast: Secondary | ICD-10-CM

## 2011-08-09 DIAGNOSIS — C50912 Malignant neoplasm of unspecified site of left female breast: Secondary | ICD-10-CM

## 2011-08-09 DIAGNOSIS — Z5112 Encounter for antineoplastic immunotherapy: Secondary | ICD-10-CM

## 2011-08-09 LAB — CBC WITH DIFFERENTIAL/PLATELET
BASO%: 0.2 % (ref 0.0–2.0)
EOS%: 2.4 % (ref 0.0–7.0)
LYMPH%: 48.2 % (ref 14.0–49.7)
MCH: 28.6 pg (ref 25.1–34.0)
MCHC: 34.7 g/dL (ref 31.5–36.0)
MONO#: 0.5 10*3/uL (ref 0.1–0.9)
Platelets: 213 10*3/uL (ref 145–400)
RBC: 4.34 10*6/uL (ref 3.70–5.45)
WBC: 4.5 10*3/uL (ref 3.9–10.3)
lymph#: 2.2 10*3/uL (ref 0.9–3.3)
nRBC: 0 % (ref 0–0)

## 2011-08-09 MED ORDER — HEPARIN SOD (PORK) LOCK FLUSH 100 UNIT/ML IV SOLN
500.0000 [IU] | Freq: Once | INTRAVENOUS | Status: AC | PRN
  Administered 2011-08-09: 500 [IU]
  Filled 2011-08-09: qty 5

## 2011-08-09 MED ORDER — SODIUM CHLORIDE 0.9 % IV SOLN
Freq: Once | INTRAVENOUS | Status: AC
  Administered 2011-08-09: 14:00:00 via INTRAVENOUS

## 2011-08-09 MED ORDER — TRASTUZUMAB CHEMO INJECTION 440 MG
6.0000 mg/kg | Freq: Once | INTRAVENOUS | Status: AC
  Administered 2011-08-09: 357 mg via INTRAVENOUS
  Filled 2011-08-09: qty 17

## 2011-08-09 MED ORDER — SODIUM CHLORIDE 0.9 % IJ SOLN
10.0000 mL | INTRAMUSCULAR | Status: DC | PRN
  Administered 2011-08-09: 10 mL
  Filled 2011-08-09: qty 10

## 2011-08-09 MED ORDER — ACETAMINOPHEN 325 MG PO TABS
650.0000 mg | ORAL_TABLET | Freq: Once | ORAL | Status: AC
  Administered 2011-08-09: 650 mg via ORAL

## 2011-08-09 NOTE — Patient Instructions (Signed)
Patient aware of next appointment; discharged home with no complaints. 

## 2011-08-22 ENCOUNTER — Ambulatory Visit (INDEPENDENT_AMBULATORY_CARE_PROVIDER_SITE_OTHER): Payer: Medicare Other | Admitting: General Surgery

## 2011-08-22 ENCOUNTER — Encounter (INDEPENDENT_AMBULATORY_CARE_PROVIDER_SITE_OTHER): Payer: Self-pay | Admitting: General Surgery

## 2011-08-22 VITALS — BP 132/84 | HR 99 | Temp 98.3°F | Resp 14 | Ht 64.0 in | Wt 133.2 lb

## 2011-08-22 DIAGNOSIS — C50919 Malignant neoplasm of unspecified site of unspecified female breast: Secondary | ICD-10-CM

## 2011-08-22 NOTE — Patient Instructions (Signed)
Continue regular self exams  

## 2011-08-30 ENCOUNTER — Other Ambulatory Visit (HOSPITAL_BASED_OUTPATIENT_CLINIC_OR_DEPARTMENT_OTHER): Payer: Medicare Other | Admitting: Lab

## 2011-08-30 ENCOUNTER — Telehealth: Payer: Self-pay | Admitting: *Deleted

## 2011-08-30 ENCOUNTER — Ambulatory Visit (HOSPITAL_BASED_OUTPATIENT_CLINIC_OR_DEPARTMENT_OTHER): Payer: Medicare Other | Admitting: Oncology

## 2011-08-30 ENCOUNTER — Ambulatory Visit (HOSPITAL_BASED_OUTPATIENT_CLINIC_OR_DEPARTMENT_OTHER): Payer: Medicare Other

## 2011-08-30 VITALS — BP 120/79 | HR 85 | Temp 98.5°F | Ht 64.0 in | Wt 131.8 lb

## 2011-08-30 DIAGNOSIS — Z853 Personal history of malignant neoplasm of breast: Secondary | ICD-10-CM

## 2011-08-30 DIAGNOSIS — C50219 Malignant neoplasm of upper-inner quadrant of unspecified female breast: Secondary | ICD-10-CM

## 2011-08-30 DIAGNOSIS — E119 Type 2 diabetes mellitus without complications: Secondary | ICD-10-CM

## 2011-08-30 DIAGNOSIS — Z171 Estrogen receptor negative status [ER-]: Secondary | ICD-10-CM

## 2011-08-30 DIAGNOSIS — Z5112 Encounter for antineoplastic immunotherapy: Secondary | ICD-10-CM

## 2011-08-30 DIAGNOSIS — C50912 Malignant neoplasm of unspecified site of left female breast: Secondary | ICD-10-CM

## 2011-08-30 DIAGNOSIS — C50919 Malignant neoplasm of unspecified site of unspecified female breast: Secondary | ICD-10-CM

## 2011-08-30 LAB — CBC WITH DIFFERENTIAL/PLATELET
Basophils Absolute: 0 10*3/uL (ref 0.0–0.1)
EOS%: 3.1 % (ref 0.0–7.0)
LYMPH%: 51.6 % — ABNORMAL HIGH (ref 14.0–49.7)
MCH: 29 pg (ref 25.1–34.0)
MCV: 83.5 fL (ref 79.5–101.0)
MONO%: 10.7 % (ref 0.0–14.0)
Platelets: 190 10*3/uL (ref 145–400)
RBC: 4.55 10*6/uL (ref 3.70–5.45)
RDW: 13.5 % (ref 11.2–14.5)
nRBC: 0 % (ref 0–0)

## 2011-08-30 LAB — COMPREHENSIVE METABOLIC PANEL
Alkaline Phosphatase: 43 U/L (ref 39–117)
CO2: 28 mEq/L (ref 19–32)
Creatinine, Ser: 0.84 mg/dL (ref 0.50–1.10)
Glucose, Bld: 78 mg/dL (ref 70–99)
Sodium: 138 mEq/L (ref 135–145)
Total Bilirubin: 0.4 mg/dL (ref 0.3–1.2)
Total Protein: 6.8 g/dL (ref 6.0–8.3)

## 2011-08-30 LAB — LACTATE DEHYDROGENASE: LDH: 142 U/L (ref 94–250)

## 2011-08-30 MED ORDER — SODIUM CHLORIDE 0.9 % IJ SOLN
10.0000 mL | INTRAMUSCULAR | Status: DC | PRN
  Administered 2011-08-30: 10 mL
  Filled 2011-08-30: qty 10

## 2011-08-30 MED ORDER — ACETAMINOPHEN 325 MG PO TABS
650.0000 mg | ORAL_TABLET | Freq: Once | ORAL | Status: AC
  Administered 2011-08-30: 650 mg via ORAL

## 2011-08-30 MED ORDER — HEPARIN SOD (PORK) LOCK FLUSH 100 UNIT/ML IV SOLN
500.0000 [IU] | Freq: Once | INTRAVENOUS | Status: AC | PRN
  Administered 2011-08-30: 500 [IU]
  Filled 2011-08-30: qty 5

## 2011-08-30 MED ORDER — TRASTUZUMAB CHEMO INJECTION 440 MG
6.0000 mg/kg | Freq: Once | INTRAVENOUS | Status: AC
  Administered 2011-08-30: 357 mg via INTRAVENOUS
  Filled 2011-08-30: qty 17

## 2011-08-30 NOTE — Progress Notes (Signed)
Hematology and Oncology Follow Up Visit  Alyssa Jackson [id number] 04-07-1945 66 y.o. 08/30/2011    HPI:  A 66 year old [place], [name] with recurrent breast carcinoma, ER/PR negative, HER-2 positive, status post left simple mastectomy with sentinel node dissection on 08/04/2010, status post 6 cycles of Abraxane/Herceptin. Initiated Herceptin July 2012.  1. Prior history of a HER-2 positive, ER/PR positive left breast carcinoma treated according to [facility] 9831 protocol with completion of radiation on 02/12/2003, Arimidex between 12/16/2002 and 01/12/2008.   2. Type 2 diabetes.   3. History of hypothyroidism.   4. History of chronic neutropenia.   5. History of chronic anxiety and hot flashes.   6. History of chronic left shoulder pain.   Interim History:   Alyssa Jackson is seen today prior to initiating her next q. 3 week doses maintenance Herceptin. She denies any unexplained fevers, chills, nausea issues. She does have intermittent diarrhea stools that seemed to correlate with her single agent Herceptin infusion. She denies shortness of breath or chest pain. A detailed review of systems is otherwise noncontributory as noted below. She sees a cardiologist this week with a repeat echo which has been stable throughout.  Review of Systems: Constitutional:  no weight loss, fever, night sweats and feels well Eyes: uses glasses ENT: no complaints Cardiovascular: no chest pain or dyspnea on exertion Respiratory: no cough, shortness of breath, or wheezing Neurological: no TIA or stroke symptoms Dermatological: negative Gastrointestinal: positive for - diarrhea, which is intermittent. Genito-Urinary: no dysuria, trouble voiding, or hematuria Hematological and Lymphatic: negative Breast: negative Musculoskeletal: negative Remaining ROS negative.  Medications:   I have reviewed the patient's current medications.  Current Outpatient Prescriptions  Medication Sig Dispense  Refill  . ALBUTEROL IN Inhale into the lungs. 17 grams 2 puffs twice a day for asthma       . aspirin 81 MG tablet Take 81 mg by mouth daily.       . Cholecalciferol (VITAMIN D PO) Take 5,000 mg by mouth daily.        Marland Kitchen esomeprazole (NEXIUM) 40 MG capsule Take 40 mg by mouth daily before breakfast.        . estradiol (ESTRING) 2 MG vaginal ring Place 2 mg vaginally. follow package directions       . levothyroxine (SYNTHROID, LEVOTHROID) 112 MCG tablet Take 112 mcg by mouth daily.        . metFORMIN (GLUCOPHAGE) 1000 MG tablet Take 1,000 mg by mouth 2 (two) times daily.        . montelukast (SINGULAIR) 10 MG tablet Take 10 mg by mouth daily.        Marland Kitchen omega-3 acid ethyl esters (LOVAZA) 1 G capsule Take 1 g by mouth 2 (two) times daily.        . prochlorperazine (COMPAZINE) 10 MG tablet Take 10 mg by mouth every 6 (six) hours as needed.        . quinapril (ACCUPRIL) 20 MG tablet Take 10 mg by mouth daily.       . traZODone (DESYREL) 50 MG tablet Take 1 tablet (50 mg total) by mouth at bedtime.  30 tablet  0  . triamcinolone (KENALOG) 0.1 % cream Apply topically 2 (two) times daily as needed.       . VESICARE 10 MG tablet Take 5 mg by mouth daily.        No current facility-administered medications for this visit.   Facility-Administered Medications Ordered in Other Visits  Medication Dose Route  Frequency Provider Last Rate Last Dose  . sodium chloride 0.9 % injection 10 mL  10 mL Intracatheter PRN Pierce Crane, MD   10 mL at 03/15/11 1631    Allergies:  Allergies  Allergen Reactions  . Carboplatin     Positive skin test documented.  . Latex Other (See Comments)    Break out in blisters  . Penicillins     Since childhood  . Sulfa Drugs Cross Reactors Nausea And Vomiting  . Adhesive (Tape)   . Iodine Other (See Comments)    Gallbladder dye contrast tablets, pt states is not allergic to Iodine Has had other "dye" test with no problems.    Physical Exam: Filed Vitals:   08/30/11 1322   BP: 120/79  Pulse: 85  Temp: 98.5 F (36.9 C)  Weight: 132 lbs. HEENT:  Sclerae anicteric, conjunctivae pink.  Oropharynx clear.  No mucositis or candidiasis.   Nodes:  No cervical, supraclavicular, or axillary lymphadenopathy palpated.  Breast Exam:  Right breast is normal the left chest wall exam is unremarkable. Both axilla negative.   Lungs:  Clear to auscultation bilaterally.  No crackles, rhonchi, or wheezes.   Heart:  Regular rate and rhythm.   Abdomen:  Soft, nontender.  Positive bowel sounds.  No organomegaly or masses palpated.   Musculoskeletal:  No focal spinal tenderness to palpation.  Extremities:  Benign.  No peripheral edema or cyanosis,  resolving ecchymosis of the left lower extremity from injury to the small toe. Skin:  Benign.   Neuro:  Nonfocal, alert and oriented x 3.   Lab Results: Lab Results  Component Value Date   WBC 4.5 08/30/2011   HGB 13.2 08/30/2011   HCT 38.0 08/30/2011   MCV 83.5 08/30/2011   PLT 190 08/30/2011   NEUTROABS 1.5 08/30/2011     Chemistry      Component Value Date/Time   NA 142 07/19/2011 1213   K 3.7 07/19/2011 1213   CL 105 07/19/2011 1213   CO2 28 07/19/2011 1213   BUN 11 07/19/2011 1213   CREATININE 0.96 07/19/2011 1213   GLU 161* 06/16/2010 0908      Component Value Date/Time   CALCIUM 9.8 07/19/2011 1213   ALKPHOS 38* 07/19/2011 1213   AST 17 07/19/2011 1213   ALT 14 07/19/2011 1213   BILITOT 0.4 07/19/2011 1213    Radiographic data: 05/30/11 BILATERAL BREAST MRI WITH AND WITHOUT CONTRAST  Technique: Multiplanar, multisequence MR images of both breasts were obtained prior to and following the intravenous administration of 12ml of Multihance. Three dimensional images were evaluated at the independent DynaCad workstation.  Comparison: Multiple priors most recently 05/27/2010  Findings: The patient has had a left mastectomy. Foci of  enhancement noted in the right breast. No suspicious mass or  enhancement is seen in the right  breast. There is no axillary or  internal mammary adenopathy.  A trace right pleural effusion is seen. Benign cyst versus  hemangioma is noted in the liver near the dome.  IMPRESSION:  No MRI specific evidence of malignancy, right breast. Left  mastectomy. Screening mammography is recommended in 1 year. BI-RADS CATEGORY 1: Negative.  Original Report Authenticated By: Hiram Gash, M.D.  05/23/11 RIGHT DIGITAL SCREENING MAMMOGRAM WITH CAD:  The patient has had a left mastectomy. The breast tissue is heterogeneously dense. No masses or malignant type calcifications are identified. Compared with prior studies.  Images were processed with CAD.  IMPRESSION:  No specific mammographic evidence of malignancy. Next  screening mammogram is recommended in one  year.  A result letter of this screening mammogram will be mailed directly to the patient.  ASSESSMENT: Negative - BI-RADS 1  Screening mammogram in 1 year. Signed Date/Time  Phone Pager [place], Carmon Ginsberg [place]     Assessment:  A 66 year old [place], [name] with  recurrent ER/PR negative, HER-2 positive left breast carcinoma, due for next q.3-week dose of single-agent Herceptin.     Plan:  Emerald will receive Herceptin today as scheduled. She for a dose abdomen in July. She is going on and family /Trip to [place] at the beginning of July and so I have gone ahead and decided to discontinue treatment at the end of today's treatment. I will ask the surgeon to remove the port. I will see her in September. She did have a followup mammogram in March with MRI scan. She is ER/PR negative I would not require hormonal therapy.  This plan was reviewed with the patient, who voices understanding and agreement.  She knows to call with any changes or problems.    Pierce Crane, MD 08/30/2011

## 2011-08-30 NOTE — Telephone Encounter (Signed)
Gave patient appointment for 11-18-2011 starting at 11:00am

## 2011-08-30 NOTE — Patient Instructions (Signed)
[facility] Discharge Instructions for Patients Receiving Chemotherapy  Today you received the following chemotherapy agents Herceptin To help prevent nausea and vomiting after your treatment, we encourage you to take your nausea medication as prescribed.  If you develop nausea and vomiting that is not controlled by your nausea medication, call the clinic. If it is after clinic hours your family physician or the after hours number for the clinic or go to the Emergency Department.   BELOW ARE SYMPTOMS THAT SHOULD BE REPORTED IMMEDIATELY:  *FEVER GREATER THAN 100.5 F  *CHILLS WITH OR WITHOUT FEVER  NAUSEA AND VOMITING THAT IS NOT CONTROLLED WITH YOUR NAUSEA MEDICATION  *UNUSUAL SHORTNESS OF BREATH  *UNUSUAL BRUISING OR BLEEDING  TENDERNESS IN MOUTH AND THROAT WITH OR WITHOUT PRESENCE OF ULCERS  *URINARY PROBLEMS  *BOWEL PROBLEMS  UNUSUAL RASH Items with * indicate a potential emergency and should be followed up as soon as possible.  One of the nurses will contact you 24 hours after your treatment. Please let the nurse know about any problems that you may have experienced. Feel free to call the clinic you have any questions or concerns. The clinic phone number is [phone].   I have been informed and understand all the instructions given to me. I know to contact the clinic, my physician, or go to the Emergency Department if any problems should occur. I do not have any questions at this time, but understand that I may call the clinic during office hours   should I have any questions or need assistance in obtaining follow up care.    __________________________________________  _____________  __________ Signature of Patient or Authorized Representative            Date                   Time    __________________________________________ Nurse's Signature    

## 2011-08-31 ENCOUNTER — Other Ambulatory Visit (INDEPENDENT_AMBULATORY_CARE_PROVIDER_SITE_OTHER): Payer: Self-pay | Admitting: General Surgery

## 2011-08-31 ENCOUNTER — Telehealth (INDEPENDENT_AMBULATORY_CARE_PROVIDER_SITE_OTHER): Payer: Self-pay | Admitting: General Surgery

## 2011-08-31 NOTE — Telephone Encounter (Signed)
I filled out orders on epic for port removal

## 2011-09-01 ENCOUNTER — Encounter (HOSPITAL_COMMUNITY): Payer: Self-pay

## 2011-09-01 ENCOUNTER — Ambulatory Visit (HOSPITAL_COMMUNITY)
Admission: RE | Admit: 2011-09-01 | Discharge: 2011-09-01 | Disposition: A | Discharge status: Home / Self Care | Payer: Medicare Other | Source: Ambulatory Visit | Attending: Physician Assistant | Admitting: Physician Assistant

## 2011-09-01 ENCOUNTER — Ambulatory Visit (HOSPITAL_COMMUNITY)
Admission: RE | Admit: 2011-09-01 | Discharge: 2011-09-01 | Disposition: A | Discharge status: Home / Self Care | Payer: Medicare Other | Source: Ambulatory Visit | Attending: Internal Medicine | Admitting: Internal Medicine

## 2011-09-01 VITALS — BP 120/66 | HR 66 | Resp 18 | Ht 64.0 in | Wt 130.8 lb

## 2011-09-01 DIAGNOSIS — E039 Hypothyroidism, unspecified: Secondary | ICD-10-CM | POA: Insufficient documentation

## 2011-09-01 DIAGNOSIS — Z90710 Acquired absence of both cervix and uterus: Secondary | ICD-10-CM | POA: Insufficient documentation

## 2011-09-01 DIAGNOSIS — D708 Other neutropenia: Secondary | ICD-10-CM | POA: Insufficient documentation

## 2011-09-01 DIAGNOSIS — E785 Hyperlipidemia, unspecified: Secondary | ICD-10-CM | POA: Insufficient documentation

## 2011-09-01 DIAGNOSIS — Z888 Allergy status to other drugs, medicaments and biological substances status: Secondary | ICD-10-CM | POA: Insufficient documentation

## 2011-09-01 DIAGNOSIS — Z880 Allergy status to penicillin: Secondary | ICD-10-CM | POA: Insufficient documentation

## 2011-09-01 DIAGNOSIS — J45909 Unspecified asthma, uncomplicated: Secondary | ICD-10-CM | POA: Insufficient documentation

## 2011-09-01 DIAGNOSIS — I079 Rheumatic tricuspid valve disease, unspecified: Secondary | ICD-10-CM | POA: Insufficient documentation

## 2011-09-01 DIAGNOSIS — R32 Unspecified urinary incontinence: Secondary | ICD-10-CM | POA: Insufficient documentation

## 2011-09-01 DIAGNOSIS — Z9889 Other specified postprocedural states: Secondary | ICD-10-CM | POA: Insufficient documentation

## 2011-09-01 DIAGNOSIS — Z09 Encounter for follow-up examination after completed treatment for conditions other than malignant neoplasm: Secondary | ICD-10-CM

## 2011-09-01 DIAGNOSIS — Z882 Allergy status to sulfonamides status: Secondary | ICD-10-CM | POA: Insufficient documentation

## 2011-09-01 DIAGNOSIS — C50919 Malignant neoplasm of unspecified site of unspecified female breast: Secondary | ICD-10-CM | POA: Insufficient documentation

## 2011-09-01 DIAGNOSIS — I10 Essential (primary) hypertension: Secondary | ICD-10-CM | POA: Insufficient documentation

## 2011-09-01 DIAGNOSIS — E119 Type 2 diabetes mellitus without complications: Secondary | ICD-10-CM | POA: Insufficient documentation

## 2011-09-01 DIAGNOSIS — Z79890 Hormone replacement therapy (postmenopausal): Secondary | ICD-10-CM | POA: Insufficient documentation

## 2011-09-01 DIAGNOSIS — F411 Generalized anxiety disorder: Secondary | ICD-10-CM | POA: Insufficient documentation

## 2011-09-01 DIAGNOSIS — Z7982 Long term (current) use of aspirin: Secondary | ICD-10-CM | POA: Insufficient documentation

## 2011-09-01 NOTE — Assessment & Plan Note (Addendum)
Completed Herceptin. Reviewed ECHOs EF and lateral S' stable. No evidence of cardio toxicity. She will need to follow up only  if she has additional chemotherapy.   Attending: Patient seen and examined with Tonye Becket, NP. We discussed all aspects of the encounter. I agree with the assessment and plan as stated above. She has completed Herceptin. Echo reviewed personally in clinic. Parameters are stable. Can f/u on an as needed basis.

## 2011-09-01 NOTE — Progress Notes (Signed)
  Echocardiogram 2D Echocardiogram has been performed.  Alyssa Jackson 09/01/2011, 4:19 PM

## 2011-09-01 NOTE — Progress Notes (Signed)
Patient ID: Alyssa Jackson, female   DOB: [birth year], 66 y.o.   MRN: [id number]  HPI:  Alyssa Jackson is a 66 y/o woman with asthma, HTN, DM2, hypothyroidism and breast CA referred by Dr. Donnie Jackson for cardiac monitoring during Herceptin therapy.  She was diagnosed with triple + L breast CA in 2004. Treated with lumpectomy + cytoxan/adriamycin/taxol + XRT at that time. On Arimidex 2004-2009. In March 2012 found to have a recurrence at the edge of her previous lumpectomy site. Underwent L mastectomy and May. Then treated with 1 round of taxotere/carboplatinum which had to be stopped due to severe diarrhea. Switched to abraxin/carbo/herceptin. Scheduled to get Herceptin q week for 18 weeks then q 3 weeks for 1 year.   Had MUGA 9/05 EF 61%. I have reviewed her echo from June 2012 (prior to any current chemo) with several colleagues and the consensus is that her EF is in the mildly depressed to low normal range range EF 45-50%. Lateral s' velocity 8.3 cm/sec.  She underwent f/u echo in August 2012 which I reviewed today EF 55-60% Lat s' 9.1 cm/sec. She also had cardiac MRI 10/2010: EF 56% no hyperenhancement ACE-I reduced due to cough by Dr. Donnie Jackson  Echo August 2012 55-60% lat s' 9.1 cm/sec Echo 12/2010 50-55% lateral s' ~8.6  (initially read as 10 cm/s but think this is artifact) Echo 02/24/11 EF 55-60% lateral s' 8.8 (poor window) Echo 06/02/11 EF 55% lateral s' 8.77 cm/sec Echo EF 50-55%  lat S' 8.6  She is here for follow up today.  Completed Herceptin. Doing great.  Denies SOB/PND/Orthopnea. Complaint with medications. Walks 1 hour per day.   ROS: All other systems normal except as mentioned in HPI, past medical history and problem list.    Past Medical History  Diagnosis Date  . Diabetes mellitus   . Hypertension   . Asthma   . Hyperlipidemia   . Incontinence   . Menopause   . Hormone replacement therapy (postmenopausal)   . Hypothyroidism   . Neutropenia     chroinic  . Anxiety   . History of  hysterectomy   . Cancer     breast cancer s/p tx on [facility] protocol 9831, completed radiation 02/12/03, on Arimidex from 12/16/02-01/12/08, with reoccurance in March 2012  . Breast cancer     Current Outpatient Prescriptions  Medication Sig Dispense Refill  . ALBUTEROL IN Inhale into the lungs. 17 grams 2 puffs twice a day for asthma       . aspirin 81 MG tablet Take 81 mg by mouth daily.       . Cholecalciferol (VITAMIN D PO) Take 5,000 mg by mouth daily.        Marland Kitchen esomeprazole (NEXIUM) 40 MG capsule Take 40 mg by mouth daily before breakfast.        . estradiol (ESTRING) 2 MG vaginal ring Place 2 mg vaginally. follow package directions       . levothyroxine (SYNTHROID, LEVOTHROID) 112 MCG tablet Take 112 mcg by mouth daily.        . metFORMIN (GLUCOPHAGE) 1000 MG tablet Take 1,000 mg by mouth 2 (two) times daily.        . montelukast (SINGULAIR) 10 MG tablet Take 10 mg by mouth daily.        Marland Kitchen omega-3 acid ethyl esters (LOVAZA) 1 G capsule Take 1 g by mouth 2 (two) times daily.        . prochlorperazine (COMPAZINE) 10 MG tablet Take 10 mg  by mouth every 6 (six) hours as needed.        . quinapril (ACCUPRIL) 20 MG tablet Take 10 mg by mouth daily.       . traZODone (DESYREL) 50 MG tablet Take 1 tablet (50 mg total) by mouth at bedtime.  30 tablet  0  . triamcinolone (KENALOG) 0.1 % cream Apply topically 2 (two) times daily as needed.       . VESICARE 10 MG tablet Take 5 mg by mouth daily.        No current facility-administered medications for this encounter.   Facility-Administered Medications Ordered in Other Encounters  Medication Dose Route Frequency Provider Last Rate Last Dose  . sodium chloride 0.9 % injection 10 mL  10 mL Intracatheter PRN Alyssa Crane, MD   10 mL at 03/15/11 1631     Allergies  Allergen Reactions  . Carboplatin     Positive skin test documented.  . Latex Other (See Comments)    Break out in blisters  . Penicillins     Since childhood  . Sulfa Drugs Cross  Reactors Nausea And Vomiting  . Adhesive (Tape)   . Iodine Other (See Comments)    Gallbladder dye contrast tablets, pt states is not allergic to Iodine Has had other "dye" test with no problems.     PHYSICAL EXAM: Filed Vitals:   09/01/11 0957  BP: 120/66  Pulse: 66  Resp: 18  Height: 5\' 4"  (1.626 m)  Weight: 130 lb 12.8 oz (59.33 kg)    General:  Well appearing. No respiratory difficulty HEENT:alopecic  normal. Neck: supple. no JVD. Carotids 2+ bilat; no bruits. No lymphadenopathy or thryomegaly appreciated. Cor: PMI nondisplaced. Regular rate & rhythm. No rubs, gallops or murmurs. Lungs: clear.  Abdomen: soft, nontender, nondistended. No hepatosplenomegaly. No bruits or masses. Good bowel sounds. Extremities: no cyanosis, clubbing, rash, edema.  Port-a-cath st subclavian Neuro: alert & oriented x 3, cranial nerves grossly intact. moves all 4 extremities w/o difficulty. Affect pleasant.   ASSESSMENT & PLAN:

## 2011-09-07 ENCOUNTER — Encounter (INDEPENDENT_AMBULATORY_CARE_PROVIDER_SITE_OTHER): Payer: Self-pay | Admitting: General Surgery

## 2011-09-07 NOTE — Progress Notes (Signed)
Subjective:     Patient ID: Alyssa Jackson, female   DOB: 19-Feb-1946, 66 y.o.   MRN: [id number]  HPI The patient is a 66 year old white female who is just over a year out from a left mastectomy for a T2 N0 left breast cancer. Since her last visit she has developed some cardiac issues with tachycardia. She seems to be feeling well today. She has minimal chest wall discomfort. She had an MRI study in March that showed no evidence of malignancy on either side.  Review of Systems  Constitutional: Negative.   HENT: Negative.   Eyes: Negative.   Respiratory: Negative.   Cardiovascular: Negative.   Gastrointestinal: Negative.   Genitourinary: Negative.   Musculoskeletal: Negative.   Skin: Negative.   Neurological: Negative.   Hematological: Negative.   Psychiatric/Behavioral: Negative.        Objective:   Physical Exam  Constitutional: She is oriented to person, place, and time. She appears well-developed and well-nourished.  HENT:  Head: Normocephalic and atraumatic.  Eyes: Conjunctivae and EOM are normal. Pupils are equal, round, and reactive to light.  Neck: Normal range of motion. Neck supple.  Cardiovascular: Normal rate, regular rhythm and normal heart sounds.   Pulmonary/Chest: Effort normal and breath sounds normal.       There is no palpable mass of the left chest wall. There is no palpable mass of the right breast. No palpable axillary or supraclavicular cervical lymphadenopathy.  Abdominal: Soft. Bowel sounds are normal. She exhibits no mass. There is no tenderness.  Musculoskeletal: Normal range of motion.  Lymphadenopathy:    She has no cervical adenopathy.  Neurological: She is alert and oriented to person, place, and time.  Skin: Skin is warm and dry.  Psychiatric: She has a normal mood and affect. Her behavior is normal.       Assessment:     Just over a year out from a left mastectomy for a left-sided breast cancer.    Plan:     At this point she seems to be  doing reasonably well. She will continue to follow with her cardiac doctors. She'll continue to do regular self exams. She'll be seeing Dr. Donnie Coffin in June. We will plan to see her back in about 3 months.

## 2011-09-28 ENCOUNTER — Ambulatory Visit: Payer: Medicare Other

## 2011-09-28 ENCOUNTER — Other Ambulatory Visit: Payer: Medicare Other | Admitting: Lab

## 2011-09-28 ENCOUNTER — Ambulatory Visit: Payer: Medicare Other | Admitting: Physician Assistant

## 2011-10-24 ENCOUNTER — Ambulatory Visit (HOSPITAL_BASED_OUTPATIENT_CLINIC_OR_DEPARTMENT_OTHER)
Admission: RE | Admit: 2011-10-24 | Discharge: 2011-10-24 | Disposition: A | Discharge status: Home / Self Care | Payer: Medicare Other | Source: Ambulatory Visit | Attending: General Surgery | Admitting: General Surgery

## 2011-10-24 ENCOUNTER — Encounter (HOSPITAL_BASED_OUTPATIENT_CLINIC_OR_DEPARTMENT_OTHER)
Admission: RE | Disposition: A | Discharge status: Home / Self Care | Payer: Self-pay | Source: Ambulatory Visit | Attending: General Surgery

## 2011-10-24 ENCOUNTER — Telehealth (INDEPENDENT_AMBULATORY_CARE_PROVIDER_SITE_OTHER): Payer: Self-pay

## 2011-10-24 ENCOUNTER — Encounter (HOSPITAL_BASED_OUTPATIENT_CLINIC_OR_DEPARTMENT_OTHER): Payer: Self-pay

## 2011-10-24 DIAGNOSIS — Z452 Encounter for adjustment and management of vascular access device: Secondary | ICD-10-CM | POA: Insufficient documentation

## 2011-10-24 DIAGNOSIS — Z853 Personal history of malignant neoplasm of breast: Secondary | ICD-10-CM | POA: Insufficient documentation

## 2011-10-24 DIAGNOSIS — C50919 Malignant neoplasm of unspecified site of unspecified female breast: Secondary | ICD-10-CM

## 2011-10-24 DIAGNOSIS — Z9010 Acquired absence of unspecified breast and nipple: Secondary | ICD-10-CM | POA: Insufficient documentation

## 2011-10-24 HISTORY — PX: PORT-A-CATH REMOVAL: SHX5289

## 2011-10-24 SURGERY — MINOR REMOVAL PORT-A-CATH
Anesthesia: LOCAL | Site: Chest | Laterality: Right | Wound class: Clean

## 2011-10-24 MED ORDER — LIDOCAINE HCL (PF) 1 % IJ SOLN
INTRAMUSCULAR | Status: DC | PRN
  Administered 2011-10-24: 10 mL

## 2011-10-24 SURGICAL SUPPLY — 18 items
ADH SKN CLS APL DERMABOND .7 (GAUZE/BANDAGES/DRESSINGS) ×1
BLADE SURG 15 STRL LF DISP TIS (BLADE) ×1 IMPLANT
BLADE SURG 15 STRL SS (BLADE) ×2
CHLORAPREP W/TINT 26ML (MISCELLANEOUS) ×2 IMPLANT
CLOTH BEACON ORANGE TIMEOUT ST (SAFETY) ×2 IMPLANT
DECANTER SPIKE VIAL GLASS SM (MISCELLANEOUS) IMPLANT
DERMABOND ADVANCED (GAUZE/BANDAGES/DRESSINGS) ×1
DERMABOND ADVANCED .7 DNX12 (GAUZE/BANDAGES/DRESSINGS) ×1 IMPLANT
DRAPE UTILITY XL STRL (DRAPES) ×2 IMPLANT
GLOVE BIO SURGEON STRL SZ7.5 (GLOVE) ×2 IMPLANT
GLOVE ECLIPSE 6.5 STRL STRAW (GLOVE) ×2 IMPLANT
NDL HYPO 25X1 1.5 SAFETY (NEEDLE) ×1 IMPLANT
NEEDLE HYPO 25X1 1.5 SAFETY (NEEDLE) ×2 IMPLANT
SUT MON AB 4-0 PC3 18 (SUTURE) ×2 IMPLANT
SUT VIC AB 3-0 SH 27 (SUTURE) ×2
SUT VIC AB 3-0 SH 27X BRD (SUTURE) ×1 IMPLANT
SYR CONTROL 10ML LL (SYRINGE) ×2 IMPLANT
TOWEL OR NON WOVEN STRL DISP B (DISPOSABLE) ×1 IMPLANT

## 2011-10-24 NOTE — Op Note (Signed)
10/24/2011  8:06 AM  PATIENT:  Alyssa Jackson  66 y.o. female  PRE-OPERATIVE DIAGNOSIS:  br ca  POST-OPERATIVE DIAGNOSIS:  * No post-op diagnosis entered *  PROCEDURE:  Procedure(s) (LRB): MINOR REMOVAL PORT-A-CATH (Right)  SURGEON:  Surgeon(s) and Role:    * Robyne Askew, MD - Primary  PHYSICIAN ASSISTANT:   ASSISTANTS: none   ANESTHESIA:   local  EBL:     BLOOD ADMINISTERED:none  DRAINS: none   LOCAL MEDICATIONS USED:  XYLOCAINE   SPECIMEN:  No Specimen  DISPOSITION OF SPECIMEN:  N/A  COUNTS:  YES  TOURNIQUET:  * No tourniquets in log *  DICTATION: .Dragon Dictation After informed consent was obtained the patient was brought to the operating room and placed in the supine position on the operating room table. Her right chest area was prepped with ChloraPrep, allowed to dry, and draped in usual sterile manner. The area around the port was infiltrated with 1% lidocaine until a good field block was created. A small incision was made with a 15 blade knife over her previous incision. This incision was carried through the subcutaneous tissue sharply with the 15 blade knife until the port was identified. The capsule surrounding the port was opened sharply with a 15 blade knife. The 2 anchoring stitches were grasped with hemostats divided and removed. The port was then gently pushed out of its pocket and with gentle traction the port was removed without difficulty. Pressure was held on the neck and chest wall for several minutes. The area was examined again and found to be hemostatic. The deep layer the wound was closed with interrupted 3-0 Vicryl stitches. The skin was closed with interrupted 4 Monocryl subcuticular stitches. A Dermabond dressing was applied. The patient tolerated the procedure well. At the end of the case all needle sponge and instrument counts were correct. The patient was then taken to recovery in stable condition.  PLAN OF CARE: Discharge to home after  PACU  PATIENT DISPOSITION:  PACU - hemodynamically stable.   Delay start of Pharmacological VTE agent (>24hrs) due to surgical blood loss or risk of bleeding: not applicable

## 2011-10-24 NOTE — H&P (Signed)
[place]  Description:  66 year old female  08/22/2011 1:30 PM Office Visit Provider:  Robyne Askew, MD  MRN: [id number] Department:  [facility]            Diagnoses  Reason for Visit    Breast cancer - Primary  Breast Cancer Long Term Follow Up   174.9  Left Maste            Vitals - Last Recorded       BP  Pulse  Temp  Resp  Ht  Wt    132/84  99  98.3 F (36.8 C) (Temporal)  14  5\' 4"  (1.626 m)  133 lb 3.2 oz (60.419 kg)          BMI               22.86 kg/m2                   Progress Notes     Robyne Askew, MD 09/07/2011 3:06 PM Signed    Subjective:    Patient ID: Alyssa Jackson, female DOB: April 12, 1945, 66 y.o. MRN: [id number]  HPI  The patient is a 65 year old white female who is just over a year out from a left mastectomy for a T2 N0 left breast cancer. Since her last visit she has developed some cardiac issues with tachycardia. She seems to be feeling well today. She has minimal chest wall discomfort. She had an MRI study in March that showed no evidence of malignancy on either side.  Review of Systems  Constitutional: Negative.  HENT: Negative.  Eyes: Negative.  Respiratory: Negative.  Cardiovascular: Negative.  Gastrointestinal: Negative.  Genitourinary: Negative.  Musculoskeletal: Negative.  Skin: Negative.  Neurological: Negative.  Hematological: Negative.  Psychiatric/Behavioral: Negative.      Objective:    Physical Exam  Constitutional: She is oriented to person, place, and time. She appears well-developed and well-nourished.  HENT:  Head: Normocephalic and atraumatic.  Eyes: Conjunctivae and EOM are normal. Pupils are equal, round, and reactive to light.  Neck: Normal range of motion. Neck supple.  Cardiovascular: Normal rate, regular rhythm and normal heart sounds.  Pulmonary/Chest: Effort normal and breath sounds normal.  There is no palpable mass of the left chest wall. There is no palpable mass of  the right breast. No palpable axillary or supraclavicular cervical lymphadenopathy.  Abdominal: Soft. Bowel sounds are normal. She exhibits no mass. There is no tenderness.  Musculoskeletal: Normal range of motion.  Lymphadenopathy:  She has no cervical adenopathy.  Neurological: She is alert and oriented to person, place, and time.  Skin: Skin is warm and dry.  Psychiatric: She has a normal mood and affect. Her behavior is normal.      Assessment:     Just over a year out from a left mastectomy for a left-sided breast cancer.     Plan:     At this point she seems to be doing reasonably well. She will continue to follow with her cardiac doctors. She'll continue to do regular self exams. She'll be seeing Dr. Donnie Coffin in June. We will plan to see her back in about 3 months.              Not recorded  Patient Instructions     Continue regular self exams          Level of Service  Follow-up and Disposition    PR OFFICE OUTPATIENT VISIT 10 MINUTES [99212]  Return in about 3 months (around 11/22/2011).           All Flowsheet Templates (all recorded)     Encounter Vitals Flowsheet   Custom Formula Data Flowsheet   Anthropometrics Flowsheet                           Referring Provider          Daisy Floro, MD            All Charges for This Encounter       Code  Description  Service Date  Service Provider  Modifiers  Quantity    [phone]  PR OFFICE OUTPATIENT VISIT 10 MINUTES  08/22/2011  Robyne Askew, MD   1                Other Encounter Related Information     Allergies & Medications      Problem List      History      Patient-Entered Questionnaires      Printed AVS Reports       Printed at    Printed by    08/22/2011 1:57 PM  After Visit Summary  Robyne Askew, MD           No data filed

## 2011-10-24 NOTE — Telephone Encounter (Signed)
The patient called and reports that she usually flip flops her visits with Dr Carolynne Edouard and Dr Donnie Coffin.  She was supposed to see Dr Carolynne Edouard in December and Dr Donnie Coffin in September.  Now she is seeing Dr Carolynne Edouard 9/11 to f/u portacath removal and she wants to use that visit as her follow up.  She plans to move her Dr Donnie Coffin visit to December.

## 2011-10-24 NOTE — Interval H&P Note (Signed)
History and Physical Interval Note:  10/24/2011 7:15 AM  Alyssa Jackson  has presented today for surgery, with the diagnosis of br ca  The various methods of treatment have been discussed with the patient and family. After consideration of risks, benefits and other options for treatment, the patient has consented to  Procedure(s) (LRB): MINOR REMOVAL PORT-A-CATH (N/A) as a surgical intervention .  The patient's history has been reviewed, patient examined, no change in status, stable for surgery.  I have reviewed the patient's chart and labs.  Questions were answered to the patient's satisfaction.     TOTH III,Shem Plemmons S

## 2011-10-25 ENCOUNTER — Encounter (HOSPITAL_BASED_OUTPATIENT_CLINIC_OR_DEPARTMENT_OTHER): Payer: Self-pay | Admitting: General Surgery

## 2011-11-03 ENCOUNTER — Other Ambulatory Visit: Payer: Self-pay | Admitting: Dermatology

## 2011-11-16 ENCOUNTER — Encounter (INDEPENDENT_AMBULATORY_CARE_PROVIDER_SITE_OTHER): Payer: Self-pay | Admitting: General Surgery

## 2011-11-16 ENCOUNTER — Ambulatory Visit (INDEPENDENT_AMBULATORY_CARE_PROVIDER_SITE_OTHER): Payer: Medicare Other | Admitting: General Surgery

## 2011-11-16 VITALS — BP 140/82 | HR 92 | Temp 97.6°F | Resp 16 | Ht 64.0 in | Wt 130.0 lb

## 2011-11-16 DIAGNOSIS — C50919 Malignant neoplasm of unspecified site of unspecified female breast: Secondary | ICD-10-CM

## 2011-11-16 NOTE — Patient Instructions (Signed)
Continue to do regular self exams 

## 2011-11-16 NOTE — Progress Notes (Signed)
Subjective:     Patient ID: Alyssa Jackson, female   DOB: 1945/04/07, 66 y.o.   MRN: [id number]  HPI The patient is a 66 year old white female who is a year and 3 months status post left mastectomy for a T2 N0 left breast cancer. She also recently had her Port-A-Cath removed and she tolerated this well. She has no complaints today.  Review of Systems  Constitutional: Negative.   HENT: Negative.   Eyes: Negative.   Respiratory: Negative.   Cardiovascular: Negative.   Gastrointestinal: Negative.   Genitourinary: Negative.   Musculoskeletal: Negative.   Skin: Negative.   Neurological: Negative.   Hematological: Negative.   Psychiatric/Behavioral: Negative.        Objective:   Physical Exam  Constitutional: She is oriented to person, place, and time. She appears well-developed and well-nourished.  HENT:  Head: Normocephalic and atraumatic.  Eyes: Conjunctivae normal and EOM are normal. Pupils are equal, round, and reactive to light.  Neck: Normal range of motion. Neck supple.  Cardiovascular: Normal rate, regular rhythm and normal heart sounds.   Pulmonary/Chest: Effort normal and breath sounds normal.       Her left mastectomy incision has healed nicely. There is no palpable mass of the left chest wall. No palpable mass of the right breast. No palpable axillary supraclavicular cervical lymphadenopathy.  Abdominal: Soft. Bowel sounds are normal. She exhibits no mass. There is no tenderness.  Musculoskeletal: Normal range of motion.  Lymphadenopathy:    She has no cervical adenopathy.  Neurological: She is alert and oriented to person, place, and time.  Skin: Skin is warm and dry.  Psychiatric: She has a normal mood and affect. Her behavior is normal.       Assessment:     1 year and 3 months status post left mastectomy and recent removal of her Port-A-Cath.    Plan:     At this point she is doing very well. She will continue to do regular self exams. We will plan to see her  back in about 6 months.

## 2011-11-18 ENCOUNTER — Other Ambulatory Visit: Payer: Medicare Other | Admitting: Lab

## 2011-11-18 ENCOUNTER — Ambulatory Visit: Payer: Medicare Other | Admitting: Oncology

## 2011-11-21 ENCOUNTER — Encounter (INDEPENDENT_AMBULATORY_CARE_PROVIDER_SITE_OTHER): Payer: Medicare Other | Admitting: General Surgery

## 2012-02-16 ENCOUNTER — Telehealth: Payer: Self-pay | Admitting: *Deleted

## 2012-02-16 NOTE — Telephone Encounter (Signed)
Patient stated she is out of town and will not be returning until 02-21-2012

## 2012-02-22 ENCOUNTER — Ambulatory Visit (HOSPITAL_BASED_OUTPATIENT_CLINIC_OR_DEPARTMENT_OTHER): Payer: Medicare Other | Admitting: Oncology

## 2012-02-22 ENCOUNTER — Telehealth: Payer: Self-pay | Admitting: *Deleted

## 2012-02-22 ENCOUNTER — Other Ambulatory Visit: Payer: Self-pay | Admitting: *Deleted

## 2012-02-22 ENCOUNTER — Other Ambulatory Visit (HOSPITAL_BASED_OUTPATIENT_CLINIC_OR_DEPARTMENT_OTHER): Payer: Medicare Other | Admitting: Lab

## 2012-02-22 VITALS — BP 127/82 | HR 90 | Temp 98.2°F | Resp 20 | Ht 64.0 in | Wt 138.5 lb

## 2012-02-22 DIAGNOSIS — C50219 Malignant neoplasm of upper-inner quadrant of unspecified female breast: Secondary | ICD-10-CM

## 2012-02-22 DIAGNOSIS — D708 Other neutropenia: Secondary | ICD-10-CM

## 2012-02-22 DIAGNOSIS — C50919 Malignant neoplasm of unspecified site of unspecified female breast: Secondary | ICD-10-CM

## 2012-02-22 DIAGNOSIS — Z171 Estrogen receptor negative status [ER-]: Secondary | ICD-10-CM

## 2012-02-22 LAB — CBC WITH DIFFERENTIAL/PLATELET
BASO%: 0.5 % (ref 0.0–2.0)
EOS%: 4.3 % (ref 0.0–7.0)
HCT: 39.4 % (ref 34.8–46.6)
LYMPH%: 41.3 % (ref 14.0–49.7)
MCH: 28.9 pg (ref 25.1–34.0)
MCHC: 33.5 g/dL (ref 31.5–36.0)
MONO#: 0.5 10*3/uL (ref 0.1–0.9)
NEUT%: 44.1 % (ref 38.4–76.8)
Platelets: 261 10*3/uL (ref 145–400)
RBC: 4.57 10*6/uL (ref 3.70–5.45)
WBC: 5.1 10*3/uL (ref 3.9–10.3)

## 2012-02-22 LAB — COMPREHENSIVE METABOLIC PANEL (CC13)
ALT: 21 U/L (ref 0–55)
AST: 20 U/L (ref 5–34)
Alkaline Phosphatase: 51 U/L (ref 40–150)
BUN: 19 mg/dL (ref 7.0–26.0)
Chloride: 99 mEq/L (ref 98–107)
Creatinine: 0.9 mg/dL (ref 0.6–1.1)
Total Bilirubin: 0.42 mg/dL (ref 0.20–1.20)

## 2012-02-22 LAB — CANCER ANTIGEN 27.29: CA 27.29: 13 U/mL (ref 0–39)

## 2012-02-22 NOTE — Progress Notes (Signed)
Hematology and Oncology Follow Up Visit  Alyssa Jackson [id number] 1945/07/11 66 y.o. 02/22/2012    HPI:  A 66 year old [place], [name] with recurrent breast carcinoma, ER/PR negative, HER-2 positive, status post left simple mastectomy with sentinel node dissection on 08/04/2010, status post 6 cycles of Abraxane/Herceptin. Initiated Herceptin July 2012. She has not used an estring since October. Of note is her mother passed away last wk in [place].   1. Prior history of a HER-2 positive, ER/PR positive left breast carcinoma treated according to [facility] 9831 protocol with completion of radiation on 02/12/2003, Arimidex between 12/16/2002 and 01/12/2008.   2. Type 2 diabetes.   3. History of hypothyroidism.   4. History of chronic neutropenia.   5. History of chronic anxiety and hot flashes.   6. History of chronic left shoulder pain.   Interim History:   She has no complaints, she feels well. Her medications are not changed. A detailed review of systems is otherwise noncontributory as noted below. She sees a cardiologist this week with a repeat echo which has been stable throughout.  Review of Systems: Constitutional:  no weight loss, fever, night sweats and feels well Eyes: uses glasses ENT: no complaints Cardiovascular: no chest pain or dyspnea on exertion Respiratory: no cough, shortness of breath, or wheezing Neurological: no TIA or stroke symptoms Dermatological: negative Gastrointestinal: positive for - diarrhea, which is intermittent. Genito-Urinary: no dysuria, trouble voiding, or hematuria Hematological and Lymphatic: negative Breast: negative Musculoskeletal: negative Remaining ROS negative.  Medications:   I have reviewed the patient's current medications.  Current Outpatient Prescriptions  Medication Sig Dispense Refill  . ALBUTEROL IN Inhale into the lungs. 17 grams 2 puffs twice a day for asthma       . aspirin 81 MG tablet Take 81 mg by  mouth daily.       . Cholecalciferol (VITAMIN D-3) 5000 UNITS TABS Take by mouth.      . esomeprazole (NEXIUM) 40 MG capsule Take 40 mg by mouth daily before breakfast.        . estradiol (ESTRING) 2 MG vaginal ring Place 2 mg vaginally. follow package directions       . levothyroxine (SYNTHROID, LEVOTHROID) 112 MCG tablet Take 112 mcg by mouth daily.        . metFORMIN (GLUCOPHAGE) 1000 MG tablet Take 1,000 mg by mouth 2 (two) times daily.        . montelukast (SINGULAIR) 10 MG tablet Take 10 mg by mouth daily.        . quinapril (ACCUPRIL) 20 MG tablet Take 10 mg by mouth daily.       Marland Kitchen triamcinolone (KENALOG) 0.1 % cream Apply topically 2 (two) times daily as needed.       . VESICARE 10 MG tablet Take 5 mg by mouth daily.        No current facility-administered medications for this visit.   Facility-Administered Medications Ordered in Other Visits  Medication Dose Route Frequency Provider Last Rate Last Dose  . sodium chloride 0.9 % injection 10 mL  10 mL Intracatheter PRN Pierce Crane, MD   10 mL at 03/15/11 1631    Allergies:  Allergies  Allergen Reactions  . Carboplatin     Positive skin test documented.  . Latex Other (See Comments)    Break out in blisters  . Penicillins     Since childhood  . Sulfa Drugs Cross Reactors Nausea And Vomiting  . Adhesive (Tape)   . Iodine  Other (See Comments)    Gallbladder dye contrast tablets, pt states is not allergic to Iodine Has had other "dye" test with no problems.    Physical Exam: Filed Vitals:   02/22/12 1440  BP: 127/82  Pulse: 90  Temp: 98.2 F (36.8 C)  Resp: 20  Weight: 132 lbs. HEENT:  Sclerae anicteric, conjunctivae pink.  Oropharynx clear.  No mucositis or candidiasis.   Nodes:  No cervical, supraclavicular, or axillary lymphadenopathy palpated.  Breast Exam:  Right breast is normal the left chest wall exam is unremarkable. Both axilla negative.   Lungs:  Clear to auscultation bilaterally.  No crackles, rhonchi, or  wheezes.   Heart:  Regular rate and rhythm.   Abdomen:  Soft, nontender.  Positive bowel sounds.  No organomegaly or masses palpated.   Musculoskeletal:  No focal spinal tenderness to palpation.  Extremities:  Benign.  No peripheral edema or cyanosis,  resolving ecchymosis of the left lower extremity from injury to the small toe. Skin:  Benign.   Neuro:  Nonfocal, alert and oriented x 3.   Lab Results: Lab Results  Component Value Date   WBC 5.1 02/22/2012   HGB 13.2 02/22/2012   HCT 39.4 02/22/2012   MCV 86.2 02/22/2012   PLT 261 02/22/2012   NEUTROABS 2.2 02/22/2012     Chemistry      Component Value Date/Time   NA 140 02/22/2012 1338   NA 138 08/30/2011 1257   K 4.3 02/22/2012 1338   K 3.9 08/30/2011 1257   CL 99 02/22/2012 1338   CL 102 08/30/2011 1257   CO2 28 02/22/2012 1338   CO2 28 08/30/2011 1257   BUN 19.0 02/22/2012 1338   BUN 14 08/30/2011 1257   CREATININE 0.9 02/22/2012 1338   CREATININE 0.84 08/30/2011 1257   GLU 161* 06/16/2010 0908      Component Value Date/Time   CALCIUM 10.1 02/22/2012 1338   CALCIUM 9.8 08/30/2011 1257   ALKPHOS 51 02/22/2012 1338   ALKPHOS 43 08/30/2011 1257   AST 20 02/22/2012 1338   AST 21 08/30/2011 1257   ALT 21 02/22/2012 1338   ALT 15 08/30/2011 1257   BILITOT 0.42 02/22/2012 1338   BILITOT 0.4 08/30/2011 1257         Assessment:  A 66 year old [place], [name] with recurrent ER/PR negative, HER-2 positive left breast carcinoma, with hx of previous breast ca 2004, er/pr+ Plan:  [name]  will return  In 6 months with repeat imaging.  We discussed possibility of alternating MRI every other year.      This plan was reviewed with the patient, who voices understanding and agreement.  She knows to call with any changes or problems.    Pierce Crane, MD 02/22/2012

## 2012-02-22 NOTE — Telephone Encounter (Signed)
Gave patient instruction on getting her six month

## 2012-05-14 ENCOUNTER — Other Ambulatory Visit: Payer: Self-pay

## 2012-05-14 DIAGNOSIS — Z1231 Encounter for screening mammogram for malignant neoplasm of breast: Secondary | ICD-10-CM

## 2012-06-05 ENCOUNTER — Encounter (INDEPENDENT_AMBULATORY_CARE_PROVIDER_SITE_OTHER): Payer: Self-pay | Admitting: General Surgery

## 2012-06-05 ENCOUNTER — Ambulatory Visit (INDEPENDENT_AMBULATORY_CARE_PROVIDER_SITE_OTHER): Payer: Medicare Other | Admitting: General Surgery

## 2012-06-05 VITALS — BP 114/70 | HR 88 | Temp 97.1°F | Resp 20 | Ht 64.0 in | Wt 139.0 lb

## 2012-06-05 DIAGNOSIS — C50912 Malignant neoplasm of unspecified site of left female breast: Secondary | ICD-10-CM

## 2012-06-05 DIAGNOSIS — C50919 Malignant neoplasm of unspecified site of unspecified female breast: Secondary | ICD-10-CM

## 2012-06-05 NOTE — Patient Instructions (Signed)
Continue regular self exams  

## 2012-06-05 NOTE — Progress Notes (Signed)
Subjective:     Patient ID: Alyssa Jackson, female   DOB: [birth year], 67 y.o.   MRN: [id number]  HPI The patient is a 68 year old white female who is one year and 9 months status post left mastectomy for a T2 N0 left breast cancer. Since her last visit she has had bilateral cataract surgery which went well. She also reports that her mom died this past 02/24/2023 but this was not unexpected. She is also changed her cholesterol medicine and is now on a statin and is complaining of cramps in her legs. She otherwise denies any chest wall pain. She is scheduled for her next mammogram on April 7.  Review of Systems  Constitutional: Negative.   HENT: Negative.   Eyes: Negative.   Respiratory: Negative.   Cardiovascular: Negative.   Gastrointestinal: Negative.   Endocrine: Negative.   Genitourinary: Negative.   Musculoskeletal: Positive for myalgias.  Skin: Negative.   Allergic/Immunologic: Negative.   Neurological: Negative.   Hematological: Negative.   Psychiatric/Behavioral: Negative.        Objective:   Physical Exam  Constitutional: She is oriented to person, place, and time. She appears well-developed and well-nourished.  HENT:  Head: Normocephalic and atraumatic.  Eyes: Conjunctivae and EOM are normal. Pupils are equal, round, and reactive to light.  Neck: Normal range of motion. Neck supple.  Cardiovascular: Normal rate, regular rhythm and normal heart sounds.   Pulmonary/Chest: Effort normal and breath sounds normal.  There is no palpable mass of the left chest wall. There is no palpable mass in the right breast. There is no palpable axillary supraclavicular or cervical lymphadenopathy  Abdominal: Soft. Bowel sounds are normal. She exhibits no mass. There is no tenderness.  Musculoskeletal: Normal range of motion.  Lymphadenopathy:    She has no cervical adenopathy.  Neurological: She is alert and oriented to person, place, and time.  Skin: Skin is warm and dry.  Psychiatric:  She has a normal mood and affect. Her behavior is normal.       Assessment:     The patient is one year and 9 months status post left mastectomy for breast cancer     Plan:     At this point she will continue to do regular self exams. We will followup on her mammogram. She is also very interested in following Dr. Donnie Coffin wherever he may end up

## 2012-06-11 ENCOUNTER — Ambulatory Visit
Admission: RE | Admit: 2012-06-11 | Discharge: 2012-06-11 | Disposition: A | Discharge status: Home / Self Care | Payer: Medicare Other | Source: Ambulatory Visit

## 2012-06-11 DIAGNOSIS — Z1231 Encounter for screening mammogram for malignant neoplasm of breast: Secondary | ICD-10-CM

## 2012-07-26 ENCOUNTER — Encounter: Payer: Self-pay | Admitting: Oncology

## 2012-07-26 ENCOUNTER — Telehealth: Payer: Self-pay | Admitting: *Deleted

## 2012-07-26 NOTE — Telephone Encounter (Signed)
Alyssa Jackson brought me a notification that Alyssa Jackson needed to be scheduled w/ a new provider.  Called pt and she requested to see Dr. Darnelle Jackson.  Informed her that she would see Alyssa Jackson and Dr. Darnelle Jackson same day and then they would send her to the lab if they felt necessary for labs that day.  She is fine w/ this.  Confirmed 08/20/12 appt w/ pt.  Mailed letter & calendar to pt.

## 2012-08-20 ENCOUNTER — Encounter: Payer: Self-pay | Admitting: Family

## 2012-08-20 ENCOUNTER — Ambulatory Visit (HOSPITAL_BASED_OUTPATIENT_CLINIC_OR_DEPARTMENT_OTHER): Payer: Medicare Other | Admitting: Lab

## 2012-08-20 ENCOUNTER — Telehealth: Payer: Self-pay | Admitting: *Deleted

## 2012-08-20 ENCOUNTER — Ambulatory Visit (HOSPITAL_BASED_OUTPATIENT_CLINIC_OR_DEPARTMENT_OTHER): Payer: Medicare Other | Admitting: Family

## 2012-08-20 VITALS — BP 121/76 | HR 98 | Temp 98.7°F | Resp 20 | Ht 64.0 in | Wt 141.7 lb

## 2012-08-20 DIAGNOSIS — E559 Vitamin D deficiency, unspecified: Secondary | ICD-10-CM

## 2012-08-20 DIAGNOSIS — C50919 Malignant neoplasm of unspecified site of unspecified female breast: Secondary | ICD-10-CM

## 2012-08-20 DIAGNOSIS — Z853 Personal history of malignant neoplasm of breast: Secondary | ICD-10-CM

## 2012-08-20 LAB — COMPREHENSIVE METABOLIC PANEL (CC13)
Albumin: 3.7 g/dL (ref 3.5–5.0)
Alkaline Phosphatase: 63 U/L (ref 40–150)
BUN: 14.7 mg/dL (ref 7.0–26.0)
CO2: 25 mEq/L (ref 22–29)
Glucose: 87 mg/dl (ref 70–99)
Potassium: 4.1 mEq/L (ref 3.5–5.1)
Total Bilirubin: 0.49 mg/dL (ref 0.20–1.20)

## 2012-08-20 LAB — CBC WITH DIFFERENTIAL/PLATELET
BASO%: 0.7 % (ref 0.0–2.0)
Basophils Absolute: 0 10*3/uL (ref 0.0–0.1)
EOS%: 2.4 % (ref 0.0–7.0)
HCT: 39.3 % (ref 34.8–46.6)
HGB: 13.1 g/dL (ref 11.6–15.9)
LYMPH%: 45 % (ref 14.0–49.7)
MCH: 28.1 pg (ref 25.1–34.0)
MCHC: 33.4 g/dL (ref 31.5–36.0)
MCV: 84.2 fL (ref 79.5–101.0)
MONO%: 12.8 % (ref 0.0–14.0)
NEUT%: 39.1 % (ref 38.4–76.8)
Platelets: 249 10*3/uL (ref 145–400)
lymph#: 2.3 10*3/uL (ref 0.9–3.3)

## 2012-08-20 NOTE — Patient Instructions (Addendum)
Please contact us at [phone] if you have any questions or concerns.  Please continue to do well and enjoy life!!!  Get plenty of rest, drink plenty of water, exercise daily (walking), eat a balanced diet.  Continue to take calcium and vitamin D3 daily.  Please schedule an office visit with Dr. Carolynne Edouard in 12/2012.  We will see you again in 06/2013, unless you need to come in before then.

## 2012-08-20 NOTE — Progress Notes (Addendum)
[facility]  Telephone:([phone] Fax:([phone]) [phone]  OFFICE PROGRESS NOTE   ID: Alyssa Jackson   DOB: [birth year]  MR#: [id number]  JYN#:[id number]   PCP: Alyssa Floro, MD GYN:  Alyssa Jackson, M.D./[facility], M.D. RAD ONC: [facility]lyssa [facility], M.D.   HISTORY OF PRESENT ILLNESS: From [facility] new patient consultation note dated 05/15/2002: "Alyssa Jackson is a 67 year old  female who in late January palpated a suspicious area in the upper aspect of her left breast.  Of note is the fact that the patient had normal screening mammogram earlier in January at [facility].  The patient was subsequently seen by [facility] and then referred to the [facility] after the area was aspirated. On mammogram there was some area of architectural distortion. No microcalcification was noted.  Ultrasound of this area revealed a 4 x 6 x 10 mm. hypoechoic mass.  The patient proceeded to undergo ultrasound core biopsy of the suspicious area in the 1:00 position of the breast.  This was performed on 04-12-02 with pathology returning invasive and intraductal carcinoma.  In addition, the patient underwent an MRI of the breast which showed a bi-lobular spiculated lesion.  In addition, there was a second lesion noted in the upper outer aspect of the left breast.  A biopsy of this area revealed a sclerosing intraductal papilloma.   In light of the above findings, the patient was taken to the operating room and underwent left partial mastectomy and left sentinel node procedure.  Pathology returned invasive ductal carcinoma measuring 1.6 cm. in greatest dimension.  This was a Grade 2 out of 3 tumor with extensive intraductal component.  The margins on the invasive tumor were clear, however, intraductal carcinoma was noted to be less than 1 mm. from the deep and cranial margins. There was lymphovascular space invasion present within the specimen.  The tumor was ER positive at 65% and PR  positive at 15%.  The proliferative index was elevated at 53 and the tumor was HER-2/neu positive at 3+.  The patient's sentinel node specimen showed an area of micrometastasis measuring 1 mm. with cytokeratin stains positive.  The patient is interested in breast conservation and is now referred to radiation oncology for evaluation."  Her subsequent history is as detailed below.   INTERVAL HISTORY: Alyssa Jackson and I saw Alyssa Jackson today for followup of recurrent invasive ductal carcinoma of the left breast.  The patient was last seen by Alyssa Jackson on 02/22/2012.  Since her last office visit, the patient has been doing relatively well.  She is establishing herself with [facility] service today.   REVIEW OF SYSTEMS: A 10 point review of systems was completed and is negative except the patient states that it is harder for her to climax during sexual activity with her husband.  The patient states that Alyssa Jackson is the only medication that relieves her vaginal dryness. The patient denies any other symptomatology.   PAST MEDICAL HISTORY: Past Medical History  Diagnosis Date  . Diabetes mellitus   . Hypertension   . Asthma   . Hyperlipidemia   . Incontinence   . Menopause   . Hormone replacement therapy (postmenopausal)   . Hypothyroidism   . Neutropenia     chroinic  . Anxiety   . History of hysterectomy   . Cancer     breast cancer s/p tx on SWOG protocol 9831, completed radiation 02/12/03, on Arimidex from 12/16/02-01/12/08, with reoccurance in March  2012  . Breast cancer     PAST SURGICAL HISTORY: Past Surgical History  Procedure Laterality Date  . Tonsillectomy    . Abdominal hysterectomy  02/2002  . Breast surgery  08/04/2010    Left mastectomy+sln,T2N0,ERPR-,Her2+  . Breast lumpectomy  04/2002    left breast  . Port-a-cath removal  10/24/2011    Procedure: MINOR REMOVAL PORT-A-CATH;  Surgeon: Robyne Askew, MD;  Location: [facility];  Service: General;   Laterality: Right;    FAMILY HISTORY Family History  Problem Relation Age of Onset  . Cancer Father     soft tissue of the jaw  . Diabetes Father     GYNECOLOGIC HISTORY: Gravida 3, para 3, menarche at age 80, age of first parity 49, the patient took birth control pills for 6 months, had an IUD placed for 9 years, and use hormone replacement therapy from the ages of 47 through 30.  She is using [facility] currently.  SOCIAL HISTORY:  Alyssa Jackson has been married to her husband Alyssa Jackson since 1966.  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  Her husband retired 20 years ago due to brain damage suffered from carbon monoxide poisoning.  They have 3 adult sons and one grandchild.  In her spare time she enjoys reading, sewing, and volunteering for numerous organizations.   ADVANCED DIRECTIVES: Not on file  HEALTH MAINTENANCE: History  Substance Use Topics  . Smoking status: Never Smoker   . Smokeless tobacco: Never Used  . Alcohol Use: No    Colonoscopy: Not on file  PAP:03/26/2009  Bone density: The patient's last bone density scan on 05/20/2010 showed a T score of -1.7 (osteopenia). Lipid panel:  Not on file   Allergies  Allergen Reactions  . Carboplatin     Positive skin test documented.  . Latex Other (See Comments)    Break out in blisters  . Penicillins     Since childhood  . Sulfa Drugs Cross Reactors Nausea And Vomiting  . Adhesive (Tape)   . Iodine Other (See Comments)    Gallbladder dye contrast tablets, pt states is not allergic to Iodine Has had other "dye" test with no problems.    Current Outpatient Prescriptions  Medication Sig Dispense Refill  . albuterol (PROVENTIL HFA;VENTOLIN HFA) 108 (90 BASE) MCG/ACT inhaler Inhale 2 puffs into the lungs as needed for wheezing.      Marland Kitchen aspirin 81 MG tablet Take 81 mg by mouth daily.       . Cholecalciferol (VITAMIN D-3) 1000 UNITS CAPS Take 1 capsule by mouth daily.      . Coenzyme Q10 (CO  Q-10) 200 MG CAPS Take 1 capsule by mouth daily.      Marland Kitchen A[facility]a [facility] 2 MG vaginal ring Place 2 mg vaginally every 3 (three) months.       . levothyroxine (SYNTHROID, LEVOTHROID) 112 MCG tablet Take 112 mcg by mouth daily.        . Magnesium Citrate 100 MG TABS Take 1 tablet by mouth daily.      . metFORMIN (GLUCOPHAGE) 1000 MG tablet Take 1,000 mg by mouth 2 (two) times daily.        . Methylcellulose, Laxative, (CITRUCEL) 500 MG TABS Take 1 tablet by mouth daily.      . montelukast (SINGULAIR) 10 MG tablet Take 10 mg by mouth daily.        . Multiple Vitamins-Minerals (MULTIVITAMIN WITH MINERALS) tablet Take 1 tablet by  mouth daily.      Marland Kitchen omeprazole (PRILOSEC) 20 MG capsule Take 20 mg by mouth daily.      . pravastatin (PRAVACHOL) 40 MG tablet Take 40 mg by mouth daily.      . quinapril (ACCUPRIL) 10 MG tablet Take 10 mg by mouth daily.       . VESICARE 10 MG tablet Take 5 mg by mouth daily.        No current facility-administered medications for this visit.   Facility-Administered Medications Ordered in Other Visits  Medication Dose Route Frequency Provider Last Rate Last Dose  . sodium chloride 0.9 % injection 10 mL  10 mL Intracatheter PRN Pierce Crane, MD   10 mL at 03/15/11 1631    OBJECTIVE: Filed Vitals:   08/20/12 1318  BP: 121/76  Pulse: 98  Temp: 98.7 F (37.1 C)  Resp: 20     Body mass index is 24.31 kg/(m^2).      ECOG FS: 1 - Symptomatic but completely ambulatory  General appearance: Alert, cooperative, well nourished, no apparent distress Head: Normocephalic, without obvious abnormality, atraumatic Eyes: Conjunctivae/corneas clear, PERRLA, EOMI Nose: Nares, septum and mucosa are normal, no drainage or sinus tenderness Neck: No adenopathy, supple, symmetrical, trachea midline, thyroid not enlarged, no tenderness Resp: Clear to auscultation bilaterally Cardio: Regular rate and rhythm, S1, S2 normal, no murmur, click, rub or gallop Breasts: Left breast is surgically  absent, left chest wall area and right breast have well-healed surgical scars, no lymphadenopathy, right breast does not have any nipple inversion, bilateral axillary fullness, firm right medial mammary ridge GI: Soft, distended, non-tender, normoactive bowel sounds, no organomegaly Extremities: Extremities normal, atraumatic, no cyanosis or edema Lymph nodes: Cervical, supraclavicular, and axillary nodes normal Neurologic: Grossly normal   LAB RESULTS: Lab Results  Component Value Date   WBC 5.1 08/20/2012   NEUTROABS 2.0 08/20/2012   HGB 13.1 08/20/2012   HCT 39.3 08/20/2012   MCV 84.2 08/20/2012   PLT 249 08/20/2012      Chemistry      Component Value Date/Time   NA 139 08/20/2012 1542   NA 138 08/30/2011 1257   K 4.1 08/20/2012 1542   K 3.9 08/30/2011 1257   CL 101 08/20/2012 1542   CL 102 08/30/2011 1257   CO2 25 08/20/2012 1542   CO2 28 08/30/2011 1257   BUN 14.7 08/20/2012 1542   BUN 14 08/30/2011 1257   CREATININE 0.9 08/20/2012 1542   CREATININE 0.84 08/30/2011 1257   GLU 161* 06/16/2010 0908      Component Value Date/Time   CALCIUM 10.3 08/20/2012 1542   CALCIUM 9.8 08/30/2011 1257   ALKPHOS 63 08/20/2012 1542   ALKPHOS 43 08/30/2011 1257   AST 24 08/20/2012 1542   AST 21 08/30/2011 1257   ALT 29 08/20/2012 1542   ALT 15 08/30/2011 1257   BILITOT 0.49 08/20/2012 1542   BILITOT 0.4 08/30/2011 1257       Lab Results  Component Value Date   LABCA2 13 02/22/2012    Urinalysis    Component Value Date/Time   LABSPEC 1.030 11/09/2010 1229    STUDIES: No results found.  ASSESSMENT: 67 y.[place], [place]:  1.  Status post left breast needle core biopsy at the 11 o'clock position on 04/12/2002 which showed invasive tumor, estrogen receptor positive 65%, progesterone receptor positive 16%, Ki-67 53%, HER-2/neu 3+ positive.  2.  MRI of the left breast on 04/18/2002 showed a bilobed spiculated enhancing mass  within the upper inner aspect of the left breast which  measured 1.8 x 1.9 x 1.7 cm.  There was a second suspicious enhancing lesion within the upper outer aspect of the left breast which measured 1.3 x 0.8 x 0.6 cm.  Bilobed spiculated mass within the upper-inner aspect of the left breast correlates with the biopsy breast cancer.  This measured 1.9 cm in greatest dimension.  3.  Status post left breast needle biopsy of mass in upper outer quadrant which showed sclerosing intraductal papilloma, alteration with prominent apical snouts and secretions (CAPSS), without atypia, no evidence of malignancy.  4.  Status post left breast lumpectomy with left axillary sentinel node biopsy on 04/24/2002 for a stage I, T1c, Nmic, MX, 1.6 cm invasive ductal carcinoma of the left breast grade 2, extensive intraductal component lymphovascular invasion present, with one lymph node with micrometastases and positive cytokeratin stains (1/1 lymph node).  5.  The patient is a participant in SWOG protocol 9831 randomized to 4 cycles of dose dense AC(Adriamycin/Cytoxan) completed on 08/07/2002 followed by weekly doses of Taxol with the completion of 12 weeks of Taxol on 12/04/2002.  6.  The patient started antiestrogen therapy with Arimidex in 12/2002.  7.  Status post radiation therapy from 12/25/2002 through 02/12/2003.  8.  Status post left breast needle core biopsy on 05/04/2007 which showed benign breast tissue with fibrosis, fat necrosis and hemosiderin-laden macrophages.  No evidence of malignancy.  9.  Status post left breast needle core biopsy at the 12 o'clock position on 06/02/2010 which showed invasive ductal carcinoma and fat necrosis with calcifications, estrogen receptor negative, progesterone receptor negative, Ki-67 25%, HER-2/neu by CISH showed amplification with a ratio of 3.35.  10.  Status post left breast simple mastectomy with left axillary sentinel lymph node biopsy on 08/04/2010 for a stage IIA pT2, pN0, 2.2 cm invasive ductal carcinoma grade 3 with  ductal carcinoma in situ high grade, estrogen receptor negative, progesterone receptor negative, Ki-67 25%, HER-2/neu by CISH showed amplification with ratio 3.35.  11.  Status post adjuvant chemotherapy with TCH (Taxotere/Carboplatin/Herceptin) x 1 cycle on 09/28/2010.  This was followed by chemotherapy with Carboplatin/Abraxane/Herceptin from 10/19/2010 through 01/25/2011 x 10 cycles.  Therapy with Herceptin continued until 08/30/2011.  12.  Status post unilateral right digital screening mammogram on 06/11/2012 showed no mammographic evidence of malignancy.  13.  Vaginal dryness   PLAN:  We plan to follow the patient annually until she is 5 years out from her definitive surgery in 07/2015.  We asked that the patient see her surgeon Dr. Carolynne Edouard in 12/2012 and we plan to see the patient again in 06/2013.  We will check laboratories of CBC, CMP, LDH, and vitamin D level at that time. The patient will be scheduled for a bilateral breast MRI with contrast in addition to her annual unilateral right digital screening mammogram in 06/2013 prior to her office visit.  The patient is currently using [facility] for her vaginal dryness.  All questions were answered.  The patient was encouraged to contact us in the interim with any problems, questions or concerns.   Larina Bras, NP-C 08/20/2012, 5:49 PM   ADDENDUM: This 67 year old [place] woman establish herself in my practice today. She underwent left lumpectomy and sentinel lymph node sampling February of 2004 for a T1c N1 (mic) stage IB invasive ductal carcinoma, which was "triple positive", expressing receptors for estrogen, progesterone, and HER-2. She was treated according to SWOG 9831 (standard therapy without Herceptin, namely doxorubicin and cyclophosphamide  x4 followed by weekly paclitaxel x12), completed September of 2004. She completed adjuvant radiation in December of 2004 and completed 5 years of anastrozole in October of 2009.  More  recently however biopsy of the left breast March of 2012 was positive for an invasive ductal carcinoma which was age and HER-2 amplified, but now estrogen and progesterone receptor negative. She underwent left mastectomy and sentinel lymph node biopsy may of 2012 for a pT2 pN0 invasive ductal carcinoma, grade 3 Thomas with a borderline proliferation fraction at 25%. She received docetaxel carboplatin and trastuzumab x1, then switched to carboplatin Abraxane and trastuzumab for an additional 10 doses, completed November of 2012. The trastuzumab was continued on until June of 2013.  It could be that Alyssa Jackson had a second primary in the same breast but I think what actually occurred is that the antiestrogen therapy cleared out the estrogen receptor positive portion of her HER-2 positive tumor and then the estrogen receptor negative portion took over. In any case she has a very good prognosis at this point and we're going to continue to see her on a every 6 month basis alternating with her surgeon, Dr. Carolynne Edouard. He will see her in October and we will see her again in April of 2015. She knows to call for any problems that may develop before that visit.  I personally saw this patient and performed a substantive portion of this encounter with the listed APP documented above.   Lowella Dell, MD

## 2012-08-20 NOTE — Telephone Encounter (Signed)
appts made and printed. Pt is aware that i informed Olegario Messier about her MRI , and that Olegario Messier will call her w/ an appt...td

## 2013-02-18 IMAGING — CR DG CHEST 1V PORT
1 series · 1 of 1 positions shown · non-contrast
Comparison: 08/03/2010

CLINICAL DATA: Right Port-A-Cath insertion.

PORTABLE CHEST - 1 VIEW

[AP]
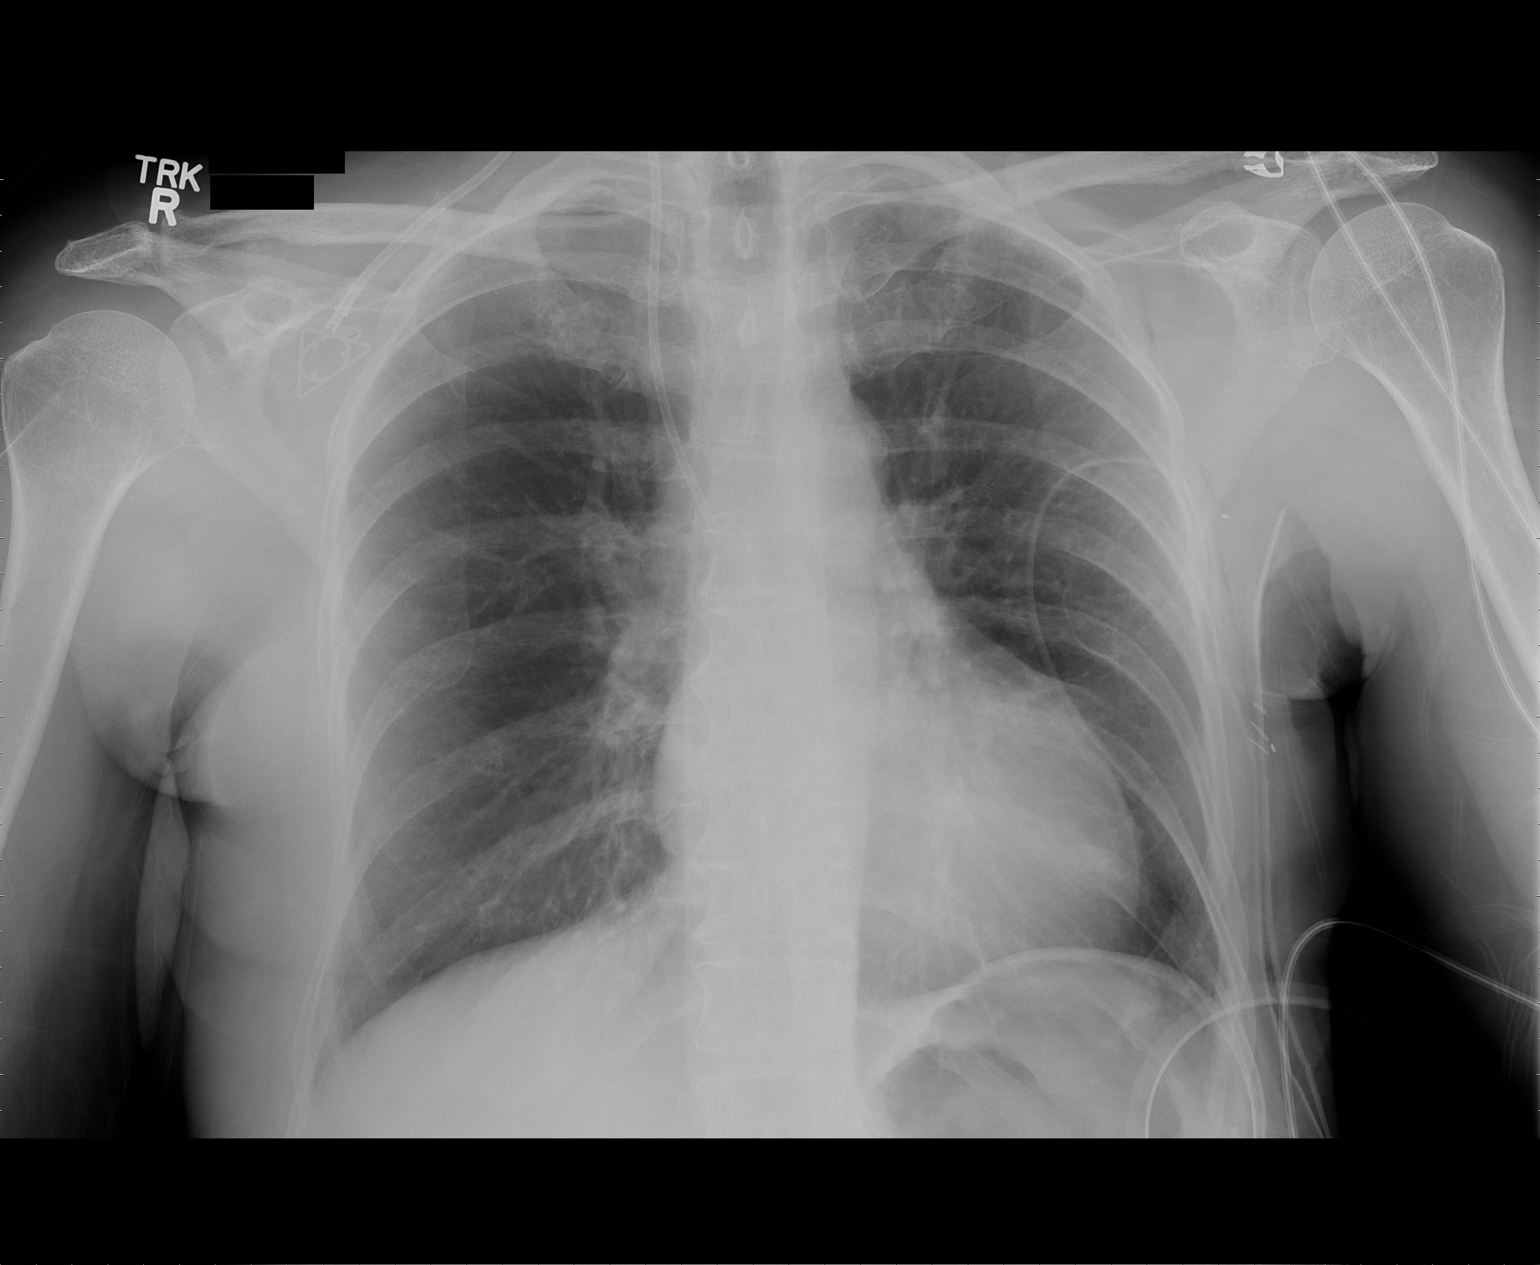

[1 of 1 positions shown; findings below may reference images not displayed]

FINDINGS: Trachea is midline.  Heart size normal.  Right IJ Port-A-
Cath tip projects over the SVC.  No pneumothorax.  Minimal linear
scarring or atelectasis at both lung bases.  No pleural fluid.
IMPRESSION: Right IJ Port-A-Cath placement without pneumothorax.

## 2013-04-25 ENCOUNTER — Telehealth: Payer: Self-pay | Admitting: *Deleted

## 2013-04-25 NOTE — Telephone Encounter (Signed)
sw pt informed her that [facility] will be on call 06/27/13. gv appt for 06/25/13 w/labs@ 1:15pm and ov@ 1:45pm. Pt is aware...td

## 2013-05-02 ENCOUNTER — Other Ambulatory Visit: Payer: Self-pay | Admitting: Physician Assistant

## 2013-05-03 ENCOUNTER — Telehealth: Payer: Self-pay | Admitting: Oncology

## 2013-05-03 NOTE — Telephone Encounter (Signed)
moved appt from AB to Dr. Owens Loffler per 2.26.15 pof

## 2013-06-04 ENCOUNTER — Telehealth: Payer: Self-pay | Admitting: Oncology

## 2013-06-04 ENCOUNTER — Other Ambulatory Visit: Payer: Self-pay | Admitting: Oncology

## 2013-06-04 NOTE — Telephone Encounter (Signed)
APRIL APPTS CXD. S/W PT RE NEW APPT FOR 5/12

## 2013-06-13 ENCOUNTER — Ambulatory Visit
Admission: RE | Admit: 2013-06-13 | Discharge: 2013-06-13 | Disposition: A | Discharge status: Home / Self Care | Payer: Medicare Other | Source: Ambulatory Visit | Attending: Family | Admitting: Family

## 2013-06-13 DIAGNOSIS — Z853 Personal history of malignant neoplasm of breast: Secondary | ICD-10-CM

## 2013-06-25 ENCOUNTER — Ambulatory Visit: Payer: Medicare Other | Admitting: Physician Assistant

## 2013-06-25 ENCOUNTER — Other Ambulatory Visit: Payer: Medicare Other

## 2013-06-25 ENCOUNTER — Ambulatory Visit: Payer: Medicare Other

## 2013-06-27 ENCOUNTER — Other Ambulatory Visit: Payer: Medicare Other

## 2013-06-27 ENCOUNTER — Ambulatory Visit: Payer: Medicare Other | Admitting: Oncology

## 2013-07-15 ENCOUNTER — Other Ambulatory Visit: Payer: Self-pay | Admitting: *Deleted

## 2013-07-15 DIAGNOSIS — C50919 Malignant neoplasm of unspecified site of unspecified female breast: Secondary | ICD-10-CM

## 2013-07-15 DIAGNOSIS — E559 Vitamin D deficiency, unspecified: Secondary | ICD-10-CM

## 2013-07-16 ENCOUNTER — Telehealth: Payer: Self-pay | Admitting: Hematology and Oncology

## 2013-07-16 ENCOUNTER — Other Ambulatory Visit (HOSPITAL_BASED_OUTPATIENT_CLINIC_OR_DEPARTMENT_OTHER): Payer: Medicare Other

## 2013-07-16 ENCOUNTER — Ambulatory Visit (HOSPITAL_BASED_OUTPATIENT_CLINIC_OR_DEPARTMENT_OTHER): Payer: Medicare Other | Admitting: Hematology and Oncology

## 2013-07-16 VITALS — BP 135/84 | HR 90 | Temp 99.0°F | Resp 19 | Ht 64.0 in | Wt 137.9 lb

## 2013-07-16 DIAGNOSIS — Z170 Estrogen receptor positive status [ER+]: Secondary | ICD-10-CM

## 2013-07-16 DIAGNOSIS — C50919 Malignant neoplasm of unspecified site of unspecified female breast: Secondary | ICD-10-CM

## 2013-07-16 LAB — CBC WITH DIFFERENTIAL/PLATELET
BASO%: 0.4 % (ref 0.0–2.0)
BASOS ABS: 0 10*3/uL (ref 0.0–0.1)
EOS%: 2.7 % (ref 0.0–7.0)
Eosinophils Absolute: 0.1 10*3/uL (ref 0.0–0.5)
HCT: 39.5 % (ref 34.8–46.6)
HGB: 13.2 g/dL (ref 11.6–15.9)
LYMPH%: 44.1 % (ref 14.0–49.7)
MCH: 28.1 pg (ref 25.1–34.0)
MCHC: 33.4 g/dL (ref 31.5–36.0)
MCV: 84 fL (ref 79.5–101.0)
MONO#: 0.6 10*3/uL (ref 0.1–0.9)
MONO%: 12.2 % (ref 0.0–14.0)
NEUT%: 40.6 % (ref 38.4–76.8)
NEUTROS ABS: 2.1 10*3/uL (ref 1.5–6.5)
PLATELETS: 246 10*3/uL (ref 145–400)
RBC: 4.7 10*6/uL (ref 3.70–5.45)
RDW: 13.5 % (ref 11.2–14.5)
WBC: 5.1 10*3/uL (ref 3.9–10.3)
lymph#: 2.3 10*3/uL (ref 0.9–3.3)

## 2013-07-16 LAB — COMPREHENSIVE METABOLIC PANEL (CC13)
ALT: 20 U/L (ref 0–55)
ANION GAP: 11 meq/L (ref 3–11)
AST: 21 U/L (ref 5–34)
Albumin: 3.5 g/dL (ref 3.5–5.0)
Alkaline Phosphatase: 64 U/L (ref 40–150)
BILIRUBIN TOTAL: 0.41 mg/dL (ref 0.20–1.20)
BUN: 13.1 mg/dL (ref 7.0–26.0)
CALCIUM: 9.9 mg/dL (ref 8.4–10.4)
CHLORIDE: 106 meq/L (ref 98–109)
CO2: 23 meq/L (ref 22–29)
CREATININE: 0.8 mg/dL (ref 0.6–1.1)
GLUCOSE: 154 mg/dL — AB (ref 70–140)
Potassium: 4.2 mEq/L (ref 3.5–5.1)
Sodium: 139 mEq/L (ref 136–145)
Total Protein: 7.1 g/dL (ref 6.4–8.3)

## 2013-07-16 LAB — LACTATE DEHYDROGENASE (CC13): LDH: 181 U/L (ref 125–245)

## 2013-07-16 NOTE — Telephone Encounter (Signed)
appts//pt stopped by scheduling,a 49yrappt was made with dr gm but pt states that she will ck with uKorealater to see if dr kEarnest Conroywill be returning and would like to switch that appt to her.  Pt also stated that she [facility] pt did not attempt to sch this herself or ret. her call,i faxed her order to [facility] as they will call her as i faxed her order,per kJuliann Pulseshe does not sch these anymore.  order also faxed to the [facility] center for her mammo

## 2013-07-16 NOTE — Progress Notes (Signed)
[place]  Telephone:([phone]) [phone] Fax:(336) [phone]  OFFICE PROGRESS NOTE   ID: Alyssa Jackson   DOB: [birth year]  MR#: [id number]  DHW#:[id number]   PCP:  Melinda Crutch, MD GYN:  [name], [name], M.D. RAD ONC: Blair Promise, M.D.  Chief complaint: Follow up visit for breast cancer  HISTORY OF PRESENT ILLNESS: From Dr. Clabe Seal new patient consultation note dated 05/15/2002: "Ms. Alyssa Jackson is a 68 year old  female who in late January palpated a suspicious area in the upper aspect of her left breast.  Of note is the fact that the patient had normal screening mammogram earlier in January at [facility].  The patient was subsequently seen by Dr. Dante Gang and then referred to the [facility] after the area was aspirated. On mammogram there was some area of architectural distortion. No microcalcification was noted.  Ultrasound of this area revealed a 4 x 6 x 10 mm. hypoechoic mass.  The patient proceeded to undergo ultrasound core biopsy of the suspicious area in the 1:00 position of the breast.  This was performed on 04-12-02 with pathology returning invasive and intraductal carcinoma.  In addition, the patient underwent an MRI of the breast which showed a bi-lobular spiculated lesion.  In addition, there was a second lesion noted in the upper outer aspect of the left breast.  A biopsy of this area revealed a sclerosing intraductal papilloma.   In light of the above findings, the patient was taken to the operating room and underwent left partial mastectomy and left sentinel node procedure.  Pathology returned invasive ductal carcinoma measuring 1.6 cm. in greatest dimension.  This was a Grade 2 out of 3 tumor with extensive intraductal component.  The margins on the invasive tumor were clear, however, intraductal carcinoma was noted to be less than 1 mm. from the deep and cranial margins. There was lymphovascular space invasion present within the specimen.   The tumor was ER positive at 65% and PR positive at 15%.  The proliferative index was elevated at 53 and the tumor was HER-2/neu positive at 3+.  The patient's sentinel node specimen showed an area of micrometastasis measuring 1 mm. with cytokeratin stains positive.  The patient is interested in breast conservation and is now referred to radiation oncology for evaluation."  Her subsequent history is as detailed below.   INTERVAL HISTORY: Alyssa Jackson is a 68 years old lady with recurrent invasive ductal carcinoma of the left breast is here for followup visit.  Since the time of last visit she's been doing much better and her vaginal dryness is much better on Estring. She says she is still sexually active with her husband and she says Estring is only medication that is helping  for her vaginal dryness. She denies any shortness of breath, chest pain, palpitations, headaches or blurred vision, constipation or blood in the stool or blood in the urine. She is also worried about not having MRI of the breast since 2 times her breast lump was picked up on MRI only not on mammogram.  REVIEW OF SYSTEMS: A 10 point review of systems was completed and is negative   PAST MEDICAL HISTORY: Past Medical History  Diagnosis Date  . Diabetes mellitus   . Hypertension   . Asthma   . Hyperlipidemia   . Incontinence   . Menopause   . Hormone replacement therapy (postmenopausal)   . Hypothyroidism   . Neutropenia     chroinic  .  Anxiety   . History of hysterectomy   . Cancer     breast cancer s/p tx on SWOG protocol 9831, completed radiation 02/12/03, on Arimidex from 12/16/02-01/12/08, with reoccurance in March 2012  . Breast cancer     PAST SURGICAL HISTORY: Past Surgical History  Procedure Laterality Date  . Tonsillectomy    . Abdominal hysterectomy  02/2002  . Breast surgery  08/04/2010    Left mastectomy+sln,T2N0,ERPR-,Her2+  . Breast lumpectomy  04/2002    left breast  . Port-a-cath removal   10/24/2011    Procedure: MINOR REMOVAL PORT-A-CATH;  Surgeon: Merrie Roof, MD;  Location: [facility];  Service: General;  Laterality: Right;    FAMILY HISTORY Family History  Problem Relation Age of Onset  . Cancer Father     soft tissue of the jaw  . Diabetes Father     GYNECOLOGIC HISTORY: Gravida 3, para 3, menarche at age 35, age of first parity 67, the patient took birth control pills for 6 months, had an IUD placed for 9 years, and use hormone replacement therapy from the ages of 27 through 86.  She is using Estring currently.  SOCIAL HISTORY:  Alyssa Jackson has been married to her husband Alyssa Jackson since 1966.  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  Her husband retired 20 years ago due to brain damage suffered from carbon monoxide poisoning.  They have 3 adult sons and one grandchild.  In her spare time she enjoys reading, sewing, and volunteering for numerous organizations.   ADVANCED DIRECTIVES: Not on file  HEALTH MAINTENANCE: History  Substance Use Topics  . Smoking status: Never Smoker   . Smokeless tobacco: Never Used  . Alcohol Use: No    Colonoscopy: Not on file  PAP:03/26/2009  Bone density: The patient's last bone density scan on 05/20/2010 showed a T score of -1.7 (osteopenia). Lipid panel:  Not on file   Allergies  Allergen Reactions  . Carboplatin     Positive skin test documented.  . Latex Other (See Comments)    Break out in blisters  . Penicillins     Since childhood  . Sulfa Drugs Cross Reactors Nausea And Vomiting  . Adhesive [Tape]   . Iodine Other (See Comments)    Gallbladder dye contrast tablets, pt states is not allergic to Iodine Has had other "dye" test with no problems.    Current Outpatient Prescriptions  Medication Sig Dispense Refill  . albuterol (PROVENTIL HFA;VENTOLIN HFA) 108 (90 BASE) MCG/ACT inhaler Inhale 2 puffs into the lungs as needed for wheezing.      Marland Kitchen aspirin 81 MG tablet  Take 81 mg by mouth daily.       . Cholecalciferol (VITAMIN D-3) 1000 UNITS CAPS Take 2 capsules by mouth daily.       . Coenzyme Q10 (CO Q-10) 200 MG CAPS Take 1 capsule by mouth daily.      Marland Kitchen ESTRING 2 MG vaginal ring Place 2 mg vaginally every 3 (three) months.       . famotidine (PEPCID AC) 10 MG chewable tablet Chew 10 mg by mouth 2 (two) times daily as needed for heartburn.      . levothyroxine (SYNTHROID, LEVOTHROID) 112 MCG tablet Take 112 mcg by mouth daily.        . Magnesium Citrate 100 MG TABS Take 1 tablet by mouth daily.      . metFORMIN (GLUCOPHAGE) 1000 MG tablet Take 1,000 mg  by mouth 2 (two) times daily.        . montelukast (SINGULAIR) 10 MG tablet Take 10 mg by mouth daily.        . pravastatin (PRAVACHOL) 40 MG tablet Take 40 mg by mouth daily.      . Pseudoephedrine-Guaifenesin (MUCINEX D PO) Take 600 mg by mouth 2 (two) times daily as needed. Extended release      . quinapril (ACCUPRIL) 10 MG tablet Take 5 mg by mouth daily.        No current facility-administered medications for this visit.   Facility-Administered Medications Ordered in Other Visits  Medication Dose Route Frequency Provider Last Rate Last Dose  . sodium chloride 0.9 % injection 10 mL  10 mL Intracatheter PRN Eston Esters, MD   10 mL at 03/15/11 1631    OBJECTIVE: Filed Vitals:   07/16/13 1320  BP: 135/84  Pulse: 90  Temp: 99 F (37.2 C)  Resp: 19     Body mass index is 23.66 kg/(m^2).      ECOG FS: 1 - Symptomatic but completely ambulatory  General appearance: Alert, cooperative, well nourished, no apparent distress Head: Normocephalic, without obvious abnormality, atraumatic Eyes: Conjunctivae/corneas clear, PERRLA, EOMI Nose: Nares, septum and mucosa are normal, no drainage or sinus tenderness Neck: No adenopathy, supple, symmetrical, trachea midline, thyroid not enlarged, no tenderness Resp: Clear to auscultation bilaterally Cardio: Regular rate and rhythm, S1, S2 normal, no murmur,  click, rub or gallop Breasts: Left breast is surgically absent, left chest wall area and right breast have well-healed surgical scars, no lymphadenopathy, right breast does not have any nipple inversion, bilateral axillary fullness, firm right medial mammary ridge GI: Soft, distended, non-tender, normoactive bowel sounds, no organomegaly Extremities: Extremities normal, atraumatic, no cyanosis or edema Lymph nodes: Cervical, supraclavicular, and axillary nodes normal Neurologic: Grossly normal   LAB RESULTS: Lab Results  Component Value Date   WBC 5.1 07/16/2013   NEUTROABS 2.1 07/16/2013   HGB 13.2 07/16/2013   HCT 39.5 07/16/2013   MCV 84.0 07/16/2013   PLT 246 07/16/2013      Chemistry      Component Value Date/Time   NA 139 07/16/2013 1307   NA 138 08/30/2011 1257   K 4.2 07/16/2013 1307   K 3.9 08/30/2011 1257   CL 101 08/20/2012 1542   CL 102 08/30/2011 1257   CO2 23 07/16/2013 1307   CO2 28 08/30/2011 1257   BUN 13.1 07/16/2013 1307   BUN 14 08/30/2011 1257   CREATININE 0.8 07/16/2013 1307   CREATININE 0.84 08/30/2011 1257   GLU 161* 06/16/2010 0908      Component Value Date/Time   CALCIUM 9.9 07/16/2013 1307   CALCIUM 9.8 08/30/2011 1257   ALKPHOS 64 07/16/2013 1307   ALKPHOS 43 08/30/2011 1257   AST 21 07/16/2013 1307   AST 21 08/30/2011 1257   ALT 20 07/16/2013 1307   ALT 15 08/30/2011 1257   BILITOT 0.41 07/16/2013 1307   BILITOT 0.4 08/30/2011 1257       Lab Results  Component Value Date   LABCA2 13 02/22/2012    Urinalysis    Component Value Date/Time   LABSPEC 1.030 11/09/2010 1229    STUDIES: No results found.  ASSESSMENT: 68 y.o. [place], [place] woman:  1.  Status post left breast needle core biopsy at the 11 o'clock position on 04/12/2002 which showed invasive tumor, estrogen receptor positive 65%, progesterone receptor positive 16%, Ki-67 53%, HER-2/neu 3+ positive.  2.  MRI of the left breast on 04/18/2002 showed a bilobed spiculated enhancing mass  within the upper inner aspect of the left breast which measured 1.8 x 1.9 x 1.7 cm.  There was a second suspicious enhancing lesion within the upper outer aspect of the left breast which measured 1.3 x 0.8 x 0.6 cm.  Bilobed spiculated mass within the upper-inner aspect of the left breast correlates with the biopsy breast cancer.  This measured 1.9 cm in greatest dimension.  3.  Status post left breast needle biopsy of mass in upper outer quadrant which showed sclerosing intraductal papilloma, alteration with prominent apical snouts and secretions (CAPSS), without atypia, no evidence of malignancy.  4.  Status post left breast lumpectomy with left axillary sentinel node biopsy on 04/24/2002 for a stage I, T1c, Nmic, MX, 1.6 cm invasive ductal carcinoma of the left breast grade 2, extensive intraductal component lymphovascular invasion present, with one lymph node with micrometastases and positive cytokeratin stains (1/1 lymph node).  5.  The patient is a participant in SWOG protocol 9831 randomized to 4 cycles of dose dense AC(Adriamycin/Cytoxan) completed on 08/07/2002 followed by weekly doses of Taxol with the completion of 12 weeks of Taxol on 12/04/2002.  6.  The patient started antiestrogen therapy with Arimidex in 12/2002.  7.  Status post radiation therapy from 12/25/2002 through 02/12/2003.  8.  Status post left breast needle core biopsy on 05/04/2007 which showed benign breast tissue with fibrosis, fat necrosis and hemosiderin-laden macrophages.  No evidence of malignancy.  9.  Status post left breast needle core biopsy at the 12 o'clock position on 06/02/2010 which showed invasive ductal carcinoma and fat necrosis with calcifications, estrogen receptor negative, progesterone receptor negative, Ki-67 25%, HER-2/neu by CISH showed amplification with a ratio of 3.35.  10.  Status post left breast simple mastectomy with left axillary sentinel lymph node biopsy on 08/04/2010 for a stage IIA pT2,  pN0, 2.2 cm invasive ductal carcinoma grade 3 with ductal carcinoma in situ high grade, estrogen receptor negative, progesterone receptor negative, Ki-67 25%, HER-2/neu by CISH showed amplification with ratio 3.35.  11.  Status post adjuvant chemotherapy with TCH (Taxotere/Carboplatin/Herceptin) x 1 cycle on 09/28/2010.  This was followed by chemotherapy with Carboplatin/Abraxane/Herceptin from 10/19/2010 through 01/25/2011 x 10 cycles.  Therapy with Herceptin continued until 08/30/2011.  12.  Status post unilateral right digital screening mammogram performed in April 2014 showed no mammographic evidence of malignancy.  13.  Vaginal dryness on Estring   PLAN: Alyssa Jackson is been doing very well. I have reviewed her mammogram report performed in April 2015 that revealed no evidence of any malignancy. Her CBC and CMP are unremarkable We'll arrange for MRI of the right breast to be performed in a week Her next right breast mammogram in April 2016 She has a scheduled appointment with Dr. Marlou Starks in December 2015 Next followup visit with breast cancer Center in 1 year with CBC differential and CMP on the day of next visit   All questions were answered.  The patient was encouraged to contact us in the interim with any problems, questions or concerns.   Wilmon Arms, M.D. Medical oncology  07/16/2013, 5:31 PM

## 2013-07-17 LAB — VITAMIN D 25 HYDROXY (VIT D DEFICIENCY, FRACTURES): VIT D 25 HYDROXY: 80 ng/mL (ref 30–89)

## 2013-07-25 ENCOUNTER — Ambulatory Visit
Admission: RE | Admit: 2013-07-25 | Discharge: 2013-07-25 | Disposition: A | Discharge status: Home / Self Care | Payer: Medicare Other | Source: Ambulatory Visit | Attending: Family | Admitting: Family

## 2013-07-25 DIAGNOSIS — Z853 Personal history of malignant neoplasm of breast: Secondary | ICD-10-CM

## 2013-07-25 MED ORDER — GADOBENATE DIMEGLUMINE 529 MG/ML IV SOLN
13.0000 mL | Freq: Once | INTRAVENOUS | Status: AC | PRN
  Administered 2013-07-25: 13 mL via INTRAVENOUS

## 2014-01-01 ENCOUNTER — Other Ambulatory Visit: Payer: Self-pay | Admitting: Nurse Practitioner

## 2014-05-28 ENCOUNTER — Other Ambulatory Visit: Payer: Self-pay | Admitting: Nurse Practitioner

## 2014-06-19 ENCOUNTER — Other Ambulatory Visit: Payer: Self-pay | Admitting: General Surgery

## 2014-06-20 ENCOUNTER — Other Ambulatory Visit: Payer: Self-pay | Admitting: *Deleted

## 2014-06-20 DIAGNOSIS — Z9012 Acquired absence of left breast and nipple: Secondary | ICD-10-CM

## 2014-06-20 DIAGNOSIS — Z853 Personal history of malignant neoplasm of breast: Secondary | ICD-10-CM

## 2014-06-20 DIAGNOSIS — Z1231 Encounter for screening mammogram for malignant neoplasm of breast: Secondary | ICD-10-CM

## 2014-06-24 ENCOUNTER — Ambulatory Visit
Admission: RE | Admit: 2014-06-24 | Discharge: 2014-06-24 | Disposition: A | Discharge status: Home / Self Care | Payer: Medicare Other | Source: Ambulatory Visit | Attending: General Surgery | Admitting: General Surgery

## 2014-07-16 ENCOUNTER — Other Ambulatory Visit: Payer: Self-pay | Admitting: *Deleted

## 2014-07-17 ENCOUNTER — Ambulatory Visit (HOSPITAL_BASED_OUTPATIENT_CLINIC_OR_DEPARTMENT_OTHER): Payer: Medicare Other | Admitting: Oncology

## 2014-07-17 ENCOUNTER — Telehealth: Payer: Self-pay | Admitting: Oncology

## 2014-07-17 ENCOUNTER — Other Ambulatory Visit: Payer: Self-pay | Admitting: *Deleted

## 2014-07-17 ENCOUNTER — Other Ambulatory Visit (HOSPITAL_BASED_OUTPATIENT_CLINIC_OR_DEPARTMENT_OTHER): Payer: Medicare Other

## 2014-07-17 VITALS — BP 156/70 | HR 84 | Temp 98.0°F | Resp 18 | Ht 64.0 in | Wt 141.7 lb

## 2014-07-17 DIAGNOSIS — C50919 Malignant neoplasm of unspecified site of unspecified female breast: Secondary | ICD-10-CM

## 2014-07-17 DIAGNOSIS — C50912 Malignant neoplasm of unspecified site of left female breast: Secondary | ICD-10-CM

## 2014-07-17 DIAGNOSIS — Z171 Estrogen receptor negative status [ER-]: Secondary | ICD-10-CM | POA: Diagnosis not present

## 2014-07-17 DIAGNOSIS — Z853 Personal history of malignant neoplasm of breast: Secondary | ICD-10-CM | POA: Diagnosis not present

## 2014-07-17 DIAGNOSIS — C50812 Malignant neoplasm of overlapping sites of left female breast: Secondary | ICD-10-CM | POA: Diagnosis not present

## 2014-07-17 DIAGNOSIS — F411 Generalized anxiety disorder: Secondary | ICD-10-CM

## 2014-07-17 DIAGNOSIS — F430 Acute stress reaction: Secondary | ICD-10-CM

## 2014-07-17 DIAGNOSIS — E559 Vitamin D deficiency, unspecified: Secondary | ICD-10-CM

## 2014-07-17 LAB — COMPREHENSIVE METABOLIC PANEL (CC13)
ALBUMIN: 3.3 g/dL — AB (ref 3.5–5.0)
ALK PHOS: 54 U/L (ref 40–150)
ALT: 21 U/L (ref 0–55)
AST: 21 U/L (ref 5–34)
Anion Gap: 11 mEq/L (ref 3–11)
BUN: 16 mg/dL (ref 7.0–26.0)
CO2: 24 mEq/L (ref 22–29)
Calcium: 9.3 mg/dL (ref 8.4–10.4)
Chloride: 104 mEq/L (ref 98–109)
Creatinine: 0.8 mg/dL (ref 0.6–1.1)
EGFR: 74 mL/min/{1.73_m2} — ABNORMAL LOW (ref >90)
GLUCOSE: 236 mg/dL — AB (ref 70–140)
Potassium: 3.7 mEq/L (ref 3.5–5.1)
SODIUM: 138 meq/L (ref 136–145)
TOTAL PROTEIN: 6.3 g/dL — AB (ref 6.4–8.3)
Total Bilirubin: 0.34 mg/dL (ref 0.20–1.20)

## 2014-07-17 LAB — CBC WITH DIFFERENTIAL/PLATELET
BASO%: 0.7 % (ref 0.0–2.0)
Basophils Absolute: 0 10*3/uL (ref 0.0–0.1)
EOS%: 1.9 % (ref 0.0–7.0)
Eosinophils Absolute: 0.1 10*3/uL (ref 0.0–0.5)
HEMATOCRIT: 36.9 % (ref 34.8–46.6)
HEMOGLOBIN: 12.3 g/dL (ref 11.6–15.9)
LYMPH%: 34.4 % (ref 14.0–49.7)
MCH: 27.7 pg (ref 25.1–34.0)
MCHC: 33.2 g/dL (ref 31.5–36.0)
MCV: 83.5 fL (ref 79.5–101.0)
MONO#: 0.5 10*3/uL (ref 0.1–0.9)
MONO%: 8.8 % (ref 0.0–14.0)
NEUT#: 3 10*3/uL (ref 1.5–6.5)
NEUT%: 54.2 % (ref 38.4–76.8)
PLATELETS: 218 10*3/uL (ref 145–400)
RBC: 4.42 10*6/uL (ref 3.70–5.45)
RDW: 13.5 % (ref 11.2–14.5)
WBC: 5.5 10*3/uL (ref 3.9–10.3)
lymph#: 1.9 10*3/uL (ref 0.9–3.3)

## 2014-07-17 MED ORDER — VENLAFAXINE HCL ER 37.5 MG PO CP24: 37.5000 mg | ORAL_CAPSULE | Freq: Every day | ORAL | Status: DC

## 2014-07-17 NOTE — Telephone Encounter (Signed)
Appointments made and avs printed for patient,she is aware to call [facility] for her mri

## 2014-07-17 NOTE — Progress Notes (Signed)
Alyssa Jackson  [phone] Fax:([phone]) [phone]  OFFICE PROGRESS NOTE   ID: Alyssa Jackson   DOB: 10/06/1945  MR#: [id number]  QIH#:[id number]   PCP:  Alyssa Crutch, MD GYN:  Alyssa Jackson. Young, M.D./Alyssa Jackson, M.D. RAD ONC: Alyssa Jackson, M.D.  Chief complaint: Estrogen receptor negative breast cancer  Current treatment: Observation  HISTORY OF PRESENT ILLNESS: From Dr. Clabe Jackson new patient consultation note dated 05/15/2002:  "Alyssa Jackson is a 69 year old  female who in late January palpated a suspicious area in the upper aspect of her left breast.  Of note is the fact that the patient had normal screening mammogram earlier in January at [place].  The patient was subsequently seen by Dr. Dante Jackson and then referred to the [facility] after the area was aspirated. On mammogram there was some area of architectural distortion. Jackson microcalcification was noted.  Ultrasound of this area revealed a 4 x 6 x 10 mm. hypoechoic mass.  The patient proceeded to undergo ultrasound core biopsy of the suspicious area in the 1:00 position of the breast.  This was performed on 04-12-02 with pathology returning invasive and intraductal carcinoma.  In addition, the patient underwent an MRI of the breast which showed a bi-lobular spiculated lesion.  In addition, there was a second lesion noted in the upper outer aspect of the left breast.  A biopsy of this area revealed a sclerosing intraductal papilloma.   In light of the above findings, the patient was taken to the operating room and underwent left partial mastectomy and left sentinel node procedure.  Pathology returned invasive ductal carcinoma measuring 1.6 cm. in greatest dimension.  This was a Grade 2 out of 3 tumor with extensive intraductal component.  The margins on the invasive tumor were clear, however, intraductal carcinoma was noted to be less than 1 mm. from the deep and cranial margins. There was lymphovascular  space invasion present within the specimen.  The tumor was ER positive at 65% and PR positive at 15%.  The proliferative index was elevated at 53 and the tumor was HER-2/neu positive at 3+.  The patient's sentinel node specimen showed an area of micrometastasis measuring 1 mm. with cytokeratin stains positive.  The patient is interested in breast conservation and is now referred to radiation oncology for evaluation."  Her subsequent history is as detailed below.   INTERVAL HISTORY: Alyssa Jackson returns today for follow-up of her breast cancer. From that point of view, she is doing well. She just had mammography 06/24/2014 which was unremarkable. She reports Jackson symptoms related to her earlier chemotherapy and anti-HER-2 treatment  REVIEW OF SYSTEMS: The main issue that has developed is that Alyssa Jackson's husband Alyssa Jackson has been found to have stomach cancer. He is being cared for by Drs. Alyssa Jackson, and Alyssa Jackson. Alyssa Jackson is doing everything she can to supportive but she finds herself experiencing uncontrollable anxiety. She can't sleep. She has significant heartburn. Her irritable bowel symptoms have increased. She has more joint and back pain than before. Her visual migraines have increased. These incapacitate her for half a day at a time. She is taking Librax to try to control her anxiety. She used to be on one pill twice a day, now frequently takes 2 tablets twice daily. She feels this is helpful temporarily She had a recent urinary tract infection which was treated with Cipro. She takes occasional walks for exercise. She tells me she hopes her husband will be here for their 50th  wedding anniversary which will be July of this year. A detailed review of systems today was otherwise stable   PAST MEDICAL HISTORY: Past Medical History  Diagnosis Date  . Diabetes mellitus   . Hypertension   . Asthma   . Hyperlipidemia   . Incontinence   . Menopause   . Hormone replacement therapy (postmenopausal)   .  Hypothyroidism   . Neutropenia     chroinic  . Anxiety   . History of hysterectomy   . Cancer     breast cancer s/p tx on SWOG protocol 9831, completed radiation 02/12/03, on Arimidex from 12/16/02-01/12/08, with reoccurance in March 2012  . Breast cancer     PAST SURGICAL HISTORY: Past Surgical History  Procedure Laterality Date  . Tonsillectomy    . Abdominal hysterectomy  02/2002  . Breast surgery  08/04/2010    Left mastectomy+sln,T2N0,ERPR-,Her2+  . Breast lumpectomy  04/2002    left breast  . Port-a-cath removal  10/24/2011    Procedure: MINOR REMOVAL PORT-A-CATH;  Surgeon: Alyssa Roof, MD;  Location: [facility];  Service: General;  Laterality: Right;    FAMILY HISTORY Family History  Problem Relation Age of Onset  . Cancer Father     soft tissue of the jaw  . Diabetes Father     GYNECOLOGIC HISTORY: Gravida 3, para 3, menarche at age 41, age of first parity 31, the patient took birth control pills for 6 months, had an IUD placed for 9 years, and use hormone replacement therapy from the ages of 89 through 25.  She is using Estring currently.  SOCIAL HISTORY:  Alyssa Jackson has been married to her husband Alyssa Jackson since 1966.  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  Her husband retired 20 years ago due to brain damage suffered from carbon monoxide poisoning.  They have 3 adult sons and one grandchild.  In her spare time she enjoys reading, sewing, and volunteering for numerous organizations.   ADVANCED DIRECTIVES: Not on file  HEALTH MAINTENANCE: History  Substance Use Topics  . Smoking status: Never Smoker   . Smokeless tobacco: Never Used  . Alcohol Use: Jackson    Colonoscopy: Not on file  PAP:03/26/2009  Bone density: The patient's last bone density scan on 05/20/2010 showed a T score of -1.7 (osteopenia). Lipid panel:  Not on file   Allergies  Allergen Reactions  . Carboplatin     Positive skin test documented.  .  Latex Other (See Comments)    Break out in blisters  . Penicillins     Since childhood  . Sulfa Drugs Cross Reactors Nausea And Vomiting  . Adhesive [Tape]   . Iodine Other (See Comments)    Gallbladder dye contrast tablets, pt states is not allergic to Iodine Has had other "dye" test with Jackson problems.    Current Outpatient Prescriptions  Medication Sig Dispense Refill  . albuterol (PROVENTIL HFA;VENTOLIN HFA) 108 (90 BASE) MCG/ACT inhaler Inhale 2 puffs into the lungs as needed for wheezing.    Marland Kitchen aspirin 81 MG tablet Take 81 mg by mouth daily.     . Cholecalciferol (VITAMIN D-3) 1000 UNITS CAPS Take 2 capsules by mouth daily.     . Coenzyme Q10 (CO Q-10) 200 MG CAPS Take 1 capsule by mouth daily.    Marland Kitchen ESTRING 2 MG vaginal ring Place 2 mg vaginally every 3 (three) months.     . famotidine (PEPCID AC) 10 MG chewable  tablet Chew 10 mg by mouth 2 (two) times daily as needed for heartburn.    . levothyroxine (SYNTHROID, LEVOTHROID) 112 MCG tablet Take 112 mcg by mouth daily.      . Magnesium Citrate 100 MG TABS Take 1 tablet by mouth daily.    . metFORMIN (GLUCOPHAGE) 1000 MG tablet Take 1,000 mg by mouth 2 (two) times daily.      . montelukast (SINGULAIR) 10 MG tablet Take 10 mg by mouth daily.      . pravastatin (PRAVACHOL) 40 MG tablet Take 40 mg by mouth daily.    . Pseudoephedrine-Guaifenesin (MUCINEX D PO) Take 600 mg by mouth 2 (two) times daily as needed. Extended release    . quinapril (ACCUPRIL) 10 MG tablet Take 5 mg by mouth daily.      Jackson current facility-administered medications for this visit.   Facility-Administered Medications Ordered in Other Visits  Medication Dose Route Frequency Provider Last Rate Last Dose  . sodium chloride 0.9 % injection 10 mL  10 mL Intracatheter PRN Eston Esters, MD   10 mL at 03/15/11 1631    OBJECTIVE: Middle-aged white woman who appears stated age 15 Vitals:   07/17/14 1310  BP: 156/70  Pulse: 84  Temp: 98 F (36.7 C)  Resp: 18      Body mass index is 24.31 kg/(m^2).      ECOG FS: 1 - Symptomatic but completely ambulatory  Sclerae unicteric, pupils round and equal Oropharynx clear and moist-- Jackson thrush or other lesions Jackson cervical or supraclavicular adenopathy Lungs Jackson rales or rhonchi Heart regular rate and rhythm Abd soft, nontender, positive bowel sounds MSK Jackson focal spinal tenderness, Jackson upper extremity lymphedema Neuro: nonfocal, well oriented, anxious affect, pressure of speech Breasts: The right breast is unremarkable. The left breast is status post mastectomy. There is Jackson evidence of chest wall recurrence. The left axilla is benign   LAB RESULTS: Lab Results  Component Value Date   WBC 5.5 07/17/2014   NEUTROABS 3.0 07/17/2014   HGB 12.3 07/17/2014   HCT 36.9 07/17/2014   MCV 83.5 07/17/2014   PLT 218 07/17/2014      Chemistry      Component Value Date/Time   NA 138 07/17/2014 1229   NA 138 08/30/2011 1257   K 3.7 07/17/2014 1229   K 3.9 08/30/2011 1257   CL 101 08/20/2012 1542   CL 102 08/30/2011 1257   CO2 24 07/17/2014 1229   CO2 28 08/30/2011 1257   BUN 16.0 07/17/2014 1229   BUN 14 08/30/2011 1257   CREATININE 0.8 07/17/2014 1229   CREATININE 0.84 08/30/2011 1257   GLU 161* 06/16/2010 0908      Component Value Date/Time   CALCIUM 9.3 07/17/2014 1229   CALCIUM 9.8 08/30/2011 1257   ALKPHOS 54 07/17/2014 1229   ALKPHOS 43 08/30/2011 1257   AST 21 07/17/2014 1229   AST 21 08/30/2011 1257   ALT 21 07/17/2014 1229   ALT 15 08/30/2011 1257   BILITOT 0.34 07/17/2014 1229   BILITOT 0.4 08/30/2011 1257       Lab Results  Component Value Date   LABCA2 13 02/22/2012    Urinalysis    Component Value Date/Time   LABSPEC 1.030 11/09/2010 1229   PHURINE 6.0 11/09/2010 1229   HGBUR Negative 11/09/2010 1229   BILIRUBINUR Negative 11/09/2010 1229   KETONESUR Negative 11/09/2010 1229   PROTEINUR 100 11/09/2010 1229   NITRITE Negative 11/09/2010 1229   LEUKOCYTESUR Trace  11/09/2010 1229    STUDIES: Mm Digital Screening Unilat R  06/24/2014   CLINICAL DATA:  Screening.  EXAM: DIGITAL SCREENING UNILATERAL RIGHT MAMMOGRAM WITH CAD  COMPARISON:  Previous exam(s).  ACR Breast Density Category c: The breast tissue is heterogeneously dense, which may obscure small masses.  FINDINGS: There are Jackson findings suspicious for malignancy. Images were processed with CAD.  IMPRESSION: Jackson mammographic evidence of malignancy. A result letter of this screening mammogram will be mailed directly to the patient.  RECOMMENDATION: Screening mammogram in one year. (Code:[id number]-B-01Y)  BI-RADS CATEGORY  1: Negative.   Electronically Signed   By: Lajean Manes M.D.   On: 06/24/2014 15:44     ASSESSMENT: 69 y.o. Alyssa Jackson woman:  1.  Status post left breast needle core biopsy at the 11 o'clock position on 04/12/2002 which showed invasive tumor, estrogen receptor positive 65%, progesterone receptor positive 16%, Ki-67 53%, HER-2/neu 3+ positive.  2.  MRI of the left breast on 04/18/2002 showed a bilobed spiculated enhancing mass within the upper inner aspect of the left breast which measured 1.8 x 1.9 x 1.7 cm.  There was a second suspicious enhancing lesion within the upper outer aspect of the left breast which measured 1.3 x 0.8 x 0.6 cm.  Bilobed spiculated mass within the upper-inner aspect of the left breast correlates with the biopsy breast cancer.  This measured 1.9 cm in greatest dimension.  3.  Status post left breast needle biopsy of mass in upper outer quadrant which showed sclerosing intraductal papilloma, alteration with prominent apical snouts and secretions (CAPSS), without atypia, Jackson evidence of malignancy.  4.  Status post left breast lumpectomy with left axillary sentinel node biopsy on 04/24/2002 for a stage I, T1c, N71mc, MX, 1.6 cm invasive ductal carcinoma of the left breast grade 2, extensive intraductal component lymphovascular invasion present, with one lymph node with  micrometastases and positive cytokeratin stains (1/1 lymph node).  5.  The patient is a participant in SWOG protocol 9831 randomized to 4 cycles of dose dense AC(Adriamycin/Cytoxan) completed on 08/07/2002 followed by weekly doses of Taxol with the completion of 12 weeks of Taxol on 12/04/2002.  6.  The patient started antiestrogen therapy with Arimidex in 12/2002.  7.  Status post radiation therapy from 12/25/2002 through 02/12/2003.  8.  Status post left breast needle core biopsy on 05/04/2007 which showed benign breast tissue with fibrosis, fat necrosis and hemosiderin-laden macrophages.  Jackson evidence of malignancy.  9.  Status post left breast needle core biopsy at the 12 o'clock position on 06/02/2010 which showed invasive ductal carcinoma and fat necrosis with calcifications, estrogen receptor negative, progesterone receptor negative, Ki-67 25%, HER-2/neu by CISH showed amplification with a ratio of 3.35.  10.  Status post left breast simple mastectomy with left axillary sentinel lymph node biopsy on 08/04/2010 for a pT2, pN0, stage IIA invasive ductal carcinoma, grade 3, estrogen receptor negative, progesterone receptor negative, Ki-67 25%, HER-2/neu by CISH showing amplification with a signals ratio of 3.35.  11.  Status post adjuvant chemotherapy with TCH (Taxotere/Carboplatin/Herceptin) x 1 cycle on 09/28/2010.  This was followed by weekly Carboplatin/Abraxane/Herceptin from 10/19/2010 through 01/25/2011 x 10 cycles.  Therapy with Herceptin continued until 08/30/2011. Post treatment echocardiogram 09/01/2011 showed a normal ejection fraction  12.  Stress reaction/situational anxiety  PLAN: CAddisynis doing fine from a breast cancer point of view, with Jackson evidence of disease recurrence now 4 years out from her definitive surgery. This is very favorable.  As documented above,  she is exceedingly anxious because of her husband's situation. She is relying on benzodiazepines and today we  discussed that these drugs do not cause dependence, but do induce tolerance. She is already requiring higher doses of Librax to get through the day.  We discussed anti-anxiety agents and she tells me she was on venlafaxine at the time of her initial rest cancer diagnosis. She tells me she tolerated it well and founded useful. I think this would be very helpful to her at this point, and for the next several months until her husband's situation is clarified.  We are going to start with the lowest available dose, 37.5 mg daily, and she will let us know within the next 2 or 3 weeks if she has any side effects. If she does not, and also is not having a significant benefit, we will up the dose to 75 mg.  As far as the breast cancer is concerned, we will see her again in one year. At that point she may consider "graduating" from follow-up here, or transitioning to our survivorship program.    Chauncey Cruel, MD   07/17/2014, 1:40 PM

## 2014-07-20 DIAGNOSIS — F430 Acute stress reaction: Secondary | ICD-10-CM

## 2014-07-20 DIAGNOSIS — F411 Generalized anxiety disorder: Secondary | ICD-10-CM | POA: Insufficient documentation

## 2014-07-21 ENCOUNTER — Other Ambulatory Visit (INDEPENDENT_AMBULATORY_CARE_PROVIDER_SITE_OTHER): Payer: Self-pay

## 2014-07-21 DIAGNOSIS — Z853 Personal history of malignant neoplasm of breast: Secondary | ICD-10-CM

## 2014-07-21 DIAGNOSIS — Z1231 Encounter for screening mammogram for malignant neoplasm of breast: Secondary | ICD-10-CM

## 2014-07-21 DIAGNOSIS — Z9012 Acquired absence of left breast and nipple: Secondary | ICD-10-CM

## 2014-07-22 ENCOUNTER — Other Ambulatory Visit: Payer: Self-pay | Admitting: *Deleted

## 2014-07-22 DIAGNOSIS — Z853 Personal history of malignant neoplasm of breast: Secondary | ICD-10-CM

## 2014-09-01 ENCOUNTER — Other Ambulatory Visit: Payer: Self-pay

## 2014-10-30 ENCOUNTER — Other Ambulatory Visit: Payer: Self-pay | Admitting: Dermatology

## 2015-06-01 ENCOUNTER — Other Ambulatory Visit: Payer: Self-pay | Admitting: *Deleted

## 2015-06-01 ENCOUNTER — Telehealth: Payer: Self-pay

## 2015-06-01 NOTE — Telephone Encounter (Signed)
Patient calling to inquire whether she needs a bone density test prior to seeing Dr. Jana Hakim in May.  The last one that writer could find was 05/20/2010.  Patient would like a return call if one does need to be scheduled however she does not need to be contacted if Dr. Jana Hakim doesn't need one scheduled.

## 2015-06-23 ENCOUNTER — Other Ambulatory Visit: Payer: Self-pay | Admitting: Oncology

## 2015-06-23 ENCOUNTER — Telehealth: Payer: Self-pay | Admitting: Oncology

## 2015-06-23 DIAGNOSIS — M8580 Other specified disorders of bone density and structure, unspecified site: Secondary | ICD-10-CM

## 2015-06-23 DIAGNOSIS — C50912 Malignant neoplasm of unspecified site of left female breast: Secondary | ICD-10-CM

## 2015-06-23 NOTE — Telephone Encounter (Signed)
spoke w/ pt confirmed bone scan appt for friday

## 2015-06-24 ENCOUNTER — Telehealth: Payer: Self-pay

## 2015-06-24 NOTE — Telephone Encounter (Signed)
Patient scheduled for a bone desity test the same day as her mammogram.  Patient notified.

## 2015-06-26 ENCOUNTER — Ambulatory Visit
Admission: RE | Admit: 2015-06-26 | Discharge: 2015-06-26 | Disposition: A | Discharge status: Home / Self Care | Payer: Medicare Other | Source: Ambulatory Visit | Attending: Oncology | Admitting: Oncology

## 2015-06-26 DIAGNOSIS — C50912 Malignant neoplasm of unspecified site of left female breast: Secondary | ICD-10-CM

## 2015-06-29 ENCOUNTER — Encounter (HOSPITAL_COMMUNITY): Payer: Self-pay | Admitting: Emergency Medicine

## 2015-06-29 ENCOUNTER — Emergency Department (HOSPITAL_COMMUNITY): Payer: Medicare Other

## 2015-06-29 ENCOUNTER — Emergency Department (HOSPITAL_COMMUNITY)
Admission: EM | Admit: 2015-06-29 | Discharge: 2015-06-29 | Disposition: A | Discharge status: Home / Self Care | Payer: Medicare Other | Attending: Emergency Medicine | Admitting: Emergency Medicine

## 2015-06-29 DIAGNOSIS — D1803 Hemangioma of intra-abdominal structures: Secondary | ICD-10-CM

## 2015-06-29 DIAGNOSIS — Z91040 Latex allergy status: Secondary | ICD-10-CM | POA: Diagnosis not present

## 2015-06-29 DIAGNOSIS — I10 Essential (primary) hypertension: Secondary | ICD-10-CM | POA: Insufficient documentation

## 2015-06-29 DIAGNOSIS — R1013 Epigastric pain: Secondary | ICD-10-CM

## 2015-06-29 DIAGNOSIS — Z7951 Long term (current) use of inhaled steroids: Secondary | ICD-10-CM | POA: Diagnosis not present

## 2015-06-29 DIAGNOSIS — E785 Hyperlipidemia, unspecified: Secondary | ICD-10-CM | POA: Insufficient documentation

## 2015-06-29 DIAGNOSIS — Z7982 Long term (current) use of aspirin: Secondary | ICD-10-CM | POA: Insufficient documentation

## 2015-06-29 DIAGNOSIS — E119 Type 2 diabetes mellitus without complications: Secondary | ICD-10-CM | POA: Insufficient documentation

## 2015-06-29 DIAGNOSIS — Z79899 Other long term (current) drug therapy: Secondary | ICD-10-CM | POA: Diagnosis not present

## 2015-06-29 DIAGNOSIS — R112 Nausea with vomiting, unspecified: Secondary | ICD-10-CM | POA: Diagnosis not present

## 2015-06-29 DIAGNOSIS — Z7984 Long term (current) use of oral hypoglycemic drugs: Secondary | ICD-10-CM | POA: Diagnosis not present

## 2015-06-29 DIAGNOSIS — Z853 Personal history of malignant neoplasm of breast: Secondary | ICD-10-CM | POA: Diagnosis not present

## 2015-06-29 DIAGNOSIS — J45909 Unspecified asthma, uncomplicated: Secondary | ICD-10-CM | POA: Insufficient documentation

## 2015-06-29 DIAGNOSIS — E039 Hypothyroidism, unspecified: Secondary | ICD-10-CM | POA: Insufficient documentation

## 2015-06-29 LAB — COMPREHENSIVE METABOLIC PANEL
ALBUMIN: 4.7 g/dL (ref 3.5–5.0)
ALK PHOS: 56 U/L (ref 38–126)
ALT: 25 U/L (ref 14–54)
ANION GAP: 11 (ref 5–15)
AST: 28 U/L (ref 15–41)
BILIRUBIN TOTAL: 0.8 mg/dL (ref 0.3–1.2)
BUN: 17 mg/dL (ref 6–20)
CALCIUM: 10 mg/dL (ref 8.9–10.3)
CO2: 25 mmol/L (ref 22–32)
Chloride: 105 mmol/L (ref 101–111)
Creatinine, Ser: 1.14 mg/dL — ABNORMAL HIGH (ref 0.44–1.00)
GFR calc Af Amer: 56 mL/min — ABNORMAL LOW (ref >60)
GFR calc non Af Amer: 48 mL/min — ABNORMAL LOW (ref >60)
GLUCOSE: 178 mg/dL — AB (ref 65–99)
Potassium: 4 mmol/L (ref 3.5–5.1)
SODIUM: 141 mmol/L (ref 135–145)
Total Protein: 8.1 g/dL (ref 6.5–8.1)

## 2015-06-29 LAB — CBC
HEMATOCRIT: 45.8 % (ref 36.0–46.0)
HEMOGLOBIN: 15.7 g/dL — AB (ref 12.0–15.0)
MCH: 29.2 pg (ref 26.0–34.0)
MCHC: 34.3 g/dL (ref 30.0–36.0)
MCV: 85.3 fL (ref 78.0–100.0)
Platelets: 252 10*3/uL (ref 150–400)
RBC: 5.37 MIL/uL — ABNORMAL HIGH (ref 3.87–5.11)
RDW: 13.8 % (ref 11.5–15.5)
WBC: 6.7 10*3/uL (ref 4.0–10.5)

## 2015-06-29 LAB — URINALYSIS, ROUTINE W REFLEX MICROSCOPIC
Bilirubin Urine: NEGATIVE
Glucose, UA: NEGATIVE mg/dL
Hgb urine dipstick: NEGATIVE
Ketones, ur: 15 mg/dL — AB
Nitrite: NEGATIVE
Protein, ur: 30 mg/dL — AB
SPECIFIC GRAVITY, URINE: 1.02 (ref 1.005–1.030)
pH: 8.5 — ABNORMAL HIGH (ref 5.0–8.0)

## 2015-06-29 LAB — URINE MICROSCOPIC-ADD ON
BACTERIA UA: NONE SEEN
RBC / HPF: NONE SEEN RBC/hpf (ref 0–5)

## 2015-06-29 LAB — LIPASE, BLOOD: Lipase: 13 U/L (ref 11–51)

## 2015-06-29 MED ORDER — ONDANSETRON HCL 4 MG/2ML IJ SOLN
4.0000 mg | Freq: Once | INTRAMUSCULAR | Status: AC
  Administered 2015-06-29: 4 mg via INTRAVENOUS

## 2015-06-29 MED ORDER — METOCLOPRAMIDE HCL 5 MG/ML IJ SOLN
10.0000 mg | Freq: Once | INTRAMUSCULAR | Status: AC
  Administered 2015-06-29: 10 mg via INTRAVENOUS
  Filled 2015-06-29: qty 2

## 2015-06-29 MED ORDER — DIATRIZOATE MEGLUMINE & SODIUM 66-10 % PO SOLN
15.0000 mL | Freq: Once | ORAL | Status: AC
  Administered 2015-06-29: 15 mL via ORAL

## 2015-06-29 MED ORDER — SODIUM CHLORIDE 0.9 % IV BOLUS (SEPSIS)
1000.0000 mL | Freq: Once | INTRAVENOUS | Status: AC
  Administered 2015-06-29: 1000 mL via INTRAVENOUS

## 2015-06-29 MED ORDER — IOPAMIDOL (ISOVUE-300) INJECTION 61%
80.0000 mL | Freq: Once | INTRAVENOUS | Status: AC | PRN
  Administered 2015-06-29: 80 mL via INTRAVENOUS

## 2015-06-29 MED ORDER — METOCLOPRAMIDE HCL 10 MG PO TABS: 10.0000 mg | ORAL_TABLET | Freq: Four times a day (QID) | ORAL | Status: DC | PRN

## 2015-06-29 MED ORDER — ONDANSETRON HCL 4 MG/2ML IJ SOLN
4.0000 mg | Freq: Once | INTRAMUSCULAR | Status: AC
  Administered 2015-06-29: 4 mg via INTRAVENOUS
  Filled 2015-06-29: qty 2

## 2015-06-29 MED ORDER — ONDANSETRON 4 MG PO TBDP: 4.0000 mg | ORAL_TABLET | Freq: Three times a day (TID) | ORAL | Status: DC | PRN

## 2015-06-29 NOTE — ED Notes (Signed)
From PCP, "Was seen at [facility] 3 days ago with comiting and epigastric pain. Lab work was unrevealing and sent home with omeprazole and zofran presumptively gastritis. Has been having epigastric pain and low grade temp and presisten vomiting. Has orthostatic symptoms. Concentrated urine, unable to keep meds/food down. Last po intake was midnight and vomited up the applesauce. Stomach hurts. Go to ER for IV rehydration and re-evaluation."

## 2015-06-29 NOTE — ED Notes (Signed)
Patient transported to CT 

## 2015-06-29 NOTE — ED Notes (Signed)
Attempted to draw blood, pt stated "nobody is taking my blood until I get an IV, I am too dehydrated". RN notified

## 2015-06-29 NOTE — ED Provider Notes (Signed)
CSN: [id number]     Arrival date & time 06/29/15  1054 History   First MD Initiated Contact with Patient 06/29/15 1116     Chief Complaint  Patient presents with  . Abdominal Pain  . Emesis     (Consider location/radiation/quality/duration/timing/severity/associated sxs/prior Treatment) Patient is a 70 y.o. female presenting with abdominal pain and vomiting.  Abdominal Pain Associated symptoms: nausea and vomiting   Associated symptoms: no chest pain, no constipation, no cough, no diarrhea, no dysuria, no fever, no shortness of breath and no sore throat   Emesis Associated symptoms: abdominal pain   Associated symptoms: no diarrhea, no headaches and no sore throat   Was throwing up about once per week.  Saturday threw up violently over one week ago, didn't feel bad unless eating Began trying to eat and drink small amounts Hx of IBS in past, last spring/summer was on librex which helped diarrhea 6 days ago began to have abd pain, threw up 3 times zofran began taking it Friday night, has helped with the emesis, omeprazole Epigastric abd pain after eating, ache, nonradiating, waxes and wanes since last Wednesday No diarrhea Friday was given cipro, did some blood work, urinalysis looked pretty normal per pt      Past Medical History  Diagnosis Date  . Diabetes mellitus   . Hypertension   . Asthma   . Hyperlipidemia   . Incontinence   . Menopause   . Hormone replacement therapy (postmenopausal)   . Hypothyroidism   . Neutropenia     chroinic  . Anxiety   . History of hysterectomy   . Cancer [facility])     breast cancer s/p tx on SWOG protocol 9831, completed radiation 02/12/03, on Arimidex from 12/16/02-01/12/08, with reoccurance in March 2012  . Breast cancer [facility])    Past Surgical History  Procedure Laterality Date  . Tonsillectomy    . Abdominal hysterectomy  02/2002  . Breast surgery  08/04/2010    Left mastectomy+sln,T2N0,ERPR-,Her2+  . Breast lumpectomy  04/2002     left breast  . Port-a-cath removal  10/24/2011    Procedure: MINOR REMOVAL PORT-A-CATH;  Surgeon: Merrie Roof, MD;  Location: [place];  Service: General;  Laterality: Right;  . Incontinence surgery     Family History  Problem Relation Age of Onset  . Cancer Father     soft tissue of the jaw  . Diabetes Father    Social History  Substance Use Topics  . Smoking status: Never Smoker   . Smokeless tobacco: Never Used  . Alcohol Use: No   OB History    No data available     Review of Systems  Constitutional: Negative for fever.  HENT: Negative for sore throat.   Eyes: Negative for visual disturbance.  Respiratory: Negative for cough and shortness of breath.   Cardiovascular: Negative for chest pain.  Gastrointestinal: Positive for nausea, vomiting and abdominal pain. Negative for diarrhea, constipation and blood in stool.  Genitourinary: Negative for dysuria and difficulty urinating.  Musculoskeletal: Negative for back pain and neck pain.  Skin: Negative for rash.  Neurological: Negative for syncope and headaches.      Allergies  Carboplatin; Latex; Penicillins; Sulfa drugs cross reactors; Adhesive; and Iodine  Home Medications   Prior to Admission medications   Medication Sig Start Date End Date Taking? Authorizing Provider  albuterol (PROVENTIL HFA;VENTOLIN HFA) 108 (90 BASE) MCG/ACT inhaler Inhale 2 puffs into the lungs as needed for wheezing.  Yes Historical Provider, MD  alum & mag hydroxide-simeth (MAALOX/MYLANTA) 200-200-20 MG/5ML suspension Take 30 mLs by mouth every 6 (six) hours as needed for indigestion or heartburn.   Yes Historical Provider, MD  aspirin 81 MG tablet Take 81 mg by mouth daily.    Yes Historical Provider, MD  cyclobenzaprine (FLEXERIL) 5 MG tablet Take 5 mg by mouth 2 (two) times daily as needed for muscle spasms.   Yes Historical Provider, MD  Ergocalciferol (VITAMIN D2) 400 units TABS Take 400 Units by mouth daily.   Yes  Historical Provider, MD  levothyroxine (SYNTHROID, LEVOTHROID) 75 MCG tablet Take 75 mcg by mouth daily before breakfast.   Yes Historical Provider, MD  Magnesium 250 MG TABS Take 250 mg by mouth every morning.   Yes Historical Provider, MD  metFORMIN (GLUCOPHAGE) 1000 MG tablet Take 1,000 mg by mouth 2 (two) times daily.     Yes Historical Provider, MD  montelukast (SINGULAIR) 10 MG tablet Take 10 mg by mouth daily.     Yes Historical Provider, MD  omeprazole (PRILOSEC OTC) 20 MG tablet Take 20 mg by mouth daily as needed (indigestion).   Yes Historical Provider, MD  pravastatin (PRAVACHOL) 40 MG tablet Take 40 mg by mouth daily.   Yes Historical Provider, MD  quinapril (ACCUPRIL) 5 MG tablet Take 2.5 mg by mouth at bedtime.   Yes Historical Provider, MD  venlafaxine XR (EFFEXOR-XR) 37.5 MG 24 hr capsule Take 1 capsule (37.5 mg total) by mouth daily with breakfast. 07/17/14  Yes Chauncey Cruel, MD  metoCLOPramide (REGLAN) 10 MG tablet Take 1 tablet (10 mg total) by mouth every 6 (six) hours as needed for nausea or vomiting. 06/29/15   Gareth Morgan, MD  ondansetron (ZOFRAN ODT) 4 MG disintegrating tablet Take 1 tablet (4 mg total) by mouth every 8 (eight) hours as needed for nausea or vomiting. 06/29/15   Gareth Morgan, MD   BP 146/71 mmHg  Pulse 77  Temp(Src) 98.5 F (36.9 C) (Oral)  Resp 17  SpO2 100% Physical Exam  Constitutional: She is oriented to person, place, and time. She appears well-developed and well-nourished. No distress.  HENT:  Head: Normocephalic and atraumatic.  Eyes: Conjunctivae and EOM are normal.  Neck: Normal range of motion.  Cardiovascular: Normal rate, regular rhythm, normal heart sounds and intact distal pulses.  Exam reveals no gallop and no friction rub.   No murmur heard. Pulmonary/Chest: Effort normal and breath sounds normal. No respiratory distress. She has no wheezes. She has no rales.  Abdominal: Soft. She exhibits no distension. There is tenderness  in the right upper quadrant and right lower quadrant. There is tenderness at McBurney's point. There is no guarding and no CVA tenderness.  Musculoskeletal: She exhibits no edema or tenderness.  Neurological: She is alert and oriented to person, place, and time.  Skin: Skin is warm and dry. No rash noted. She is not diaphoretic. No erythema.  Nursing note and vitals reviewed.   ED Course  Procedures (including critical care time) Labs Review Labs Reviewed  COMPREHENSIVE METABOLIC PANEL - Abnormal; Notable for the following:    Glucose, Bld 178 (*)    Creatinine, Ser 1.14 (*)    GFR calc non Af Amer 48 (*)    GFR calc Af Amer 56 (*)    All other components within normal limits  CBC - Abnormal; Notable for the following:    RBC 5.37 (*)    Hemoglobin 15.7 (*)    All other  components within normal limits  URINALYSIS, ROUTINE W REFLEX MICROSCOPIC (NOT AT [facility]) - Abnormal; Notable for the following:    APPearance CLOUDY (*)    pH 8.5 (*)    Ketones, ur 15 (*)    Protein, ur 30 (*)    Leukocytes, UA SMALL (*)    All other components within normal limits  URINE MICROSCOPIC-ADD ON - Abnormal; Notable for the following:    Squamous Epithelial / LPF 0-5 (*)    All other components within normal limits  LIPASE, BLOOD    Imaging Review Ct Abdomen Pelvis W Contrast  06/29/2015  CLINICAL DATA:  Vomiting and epigastric pain for 3 days with continued epigastric pain and low-grade fever since then EXAM: CT ABDOMEN AND PELVIS WITH CONTRAST TECHNIQUE: Multidetector CT imaging of the abdomen and pelvis was performed using the standard protocol following bolus administration of intravenous contrast. CONTRAST:  64m ISOVUE-300 IOPAMIDOL (ISOVUE-300) INJECTION 61% COMPARISON:  None. FINDINGS: Lower chest:  The visualized portions of the lung bases are clear. Hepatobiliary: There is a peripherally enhancing mass in the right lobe of the liver near the dome. It measures 4.5 x 3.1 cm and demonstrates  peripheral nodular enhancement most suggestive of a hemangioma. The smaller lesion inferior and lateral to this measures 17 mm in maximal diameter with from faint peripheral enhancement. This smaller lesion is included on the delayed images and dates consistent with a hemangioma. Gallbladder appears normal. Pancreas: Pancreas is normal Spleen: Spleen is normal Adrenals/Urinary Tract: Adrenal glands and left kidney are normal. The right kidney demonstrates a 3 cm midpole cyst. There is also a complex of calculi in the midpole collecting system measuring 15 mm. There is no hydronephrosis. The bladder is decompressed. Stomach/Bowel: Nonobstructive bowel gas pattern. Diverticulosis of the sigmoid colon with no definite evidence of diverticulitis. Appendix is normal. Small bowel is normal. Stomach is normal. Vascular/Lymphatic: No significant adenopathy. No significant vascular abnormalities. Reproductive: Uterus not identified.  No pelvic masses. Other: No free fluid. Musculoskeletal: No acute musculoskeletal findings IMPRESSION: No acute abnormalities to account for patient's symptoms. There is large bowel diverticulosis without evidence of diverticulitis. Two hepatic lesions as described above consistent with hemangiomas. The larger of these is 4.5 cm and in 2006 was present as a 1 cm enhancing hepatic nodule. The smaller was not present. Although there has been significant interval change from 2006, the progressive centripetal enhancement of the smaller lesion, and the thick nodular peripheral enhancement of the larger lesion, are consistent with hemangiomas. Consider hemangioma protocol MRI or CT on a nonemergent basis to confirm this diagnosis given personal history of breast carcinoma. Electronically Signed   By: RSkipper ClicheM.D.   On: 06/29/2015 16:00   I have personally reviewed and evaluated these images and lab results as part of my medical decision-making.   EKG Interpretation None      MDM    Final diagnoses:  Epigastric pain  Non-intractable vomiting with nausea, vomiting of unspecified type  Suspected hemangioma-recommend outpatient MRI to further characterize   89yofemale presents with concern for nausea, vomiting and abdominal pain.  Pt with abdominal tenderness on exam, denies HA/CP/SOB and doubt other intracranial or intrathoracic etiology of emesis.  Hx concerning for possible cholelithiasis, however pt also with RLQ tenderness on exam and CT abd/pelvis ordered showing no sign of acute pathology.  Given patient without any gallbladder abnormalities on CT, no abnormalities of hepatic labs, no leukocytosis, no fever, doubt acute cholecystitis. CT shows no sign of appendicitis  or obstruction. Discussed incidental findings of hemangiomas and recommendation for MRI for clarification.  Patient able to tolerate po fluids in ED after zofran and reglan. No sign of UTI. No DKA. Recommend discontinuing cipro. Vital signs WNL. Mild increase in Cr likely secondary to dehydration.  Discussed all results. Suspect gastritis vs possible gastroparesis. Recommend continuing omeprazole, reglan for nausea, zofran odt for when unable to keep down reglan. Patient discharged in stable condition with understanding of reasons to return.    Gareth Morgan, MD 06/29/15 2219

## 2015-07-07 ENCOUNTER — Other Ambulatory Visit (HOSPITAL_BASED_OUTPATIENT_CLINIC_OR_DEPARTMENT_OTHER): Payer: Medicare Other

## 2015-07-07 DIAGNOSIS — C50812 Malignant neoplasm of overlapping sites of left female breast: Secondary | ICD-10-CM | POA: Diagnosis not present

## 2015-07-07 DIAGNOSIS — C50919 Malignant neoplasm of unspecified site of unspecified female breast: Secondary | ICD-10-CM

## 2015-07-07 LAB — CBC WITH DIFFERENTIAL/PLATELET
BASO%: 0.9 % (ref 0.0–2.0)
Basophils Absolute: 0 10*3/uL (ref 0.0–0.1)
EOS ABS: 0.1 10*3/uL (ref 0.0–0.5)
EOS%: 2.9 % (ref 0.0–7.0)
HEMATOCRIT: 43.9 % (ref 34.8–46.6)
HGB: 14.3 g/dL (ref 11.6–15.9)
LYMPH#: 2.4 10*3/uL (ref 0.9–3.3)
LYMPH%: 47.8 % (ref 14.0–49.7)
MCH: 28.1 pg (ref 25.1–34.0)
MCHC: 32.5 g/dL (ref 31.5–36.0)
MCV: 86.5 fL (ref 79.5–101.0)
MONO#: 0.5 10*3/uL (ref 0.1–0.9)
MONO%: 9.6 % (ref 0.0–14.0)
NEUT%: 38.8 % (ref 38.4–76.8)
NEUTROS ABS: 2 10*3/uL (ref 1.5–6.5)
PLATELETS: 237 10*3/uL (ref 145–400)
RBC: 5.08 10*6/uL (ref 3.70–5.45)
RDW: 13.9 % (ref 11.2–14.5)
WBC: 5 10*3/uL (ref 3.9–10.3)

## 2015-07-07 LAB — COMPREHENSIVE METABOLIC PANEL
ALT: 46 U/L (ref 0–55)
ANION GAP: 10 meq/L (ref 3–11)
AST: 46 U/L — ABNORMAL HIGH (ref 5–34)
Albumin: 3.8 g/dL (ref 3.5–5.0)
Alkaline Phosphatase: 55 U/L (ref 40–150)
BILIRUBIN TOTAL: 0.55 mg/dL (ref 0.20–1.20)
BUN: 16.7 mg/dL (ref 7.0–26.0)
CALCIUM: 9.8 mg/dL (ref 8.4–10.4)
CO2: 25 meq/L (ref 22–29)
CREATININE: 1 mg/dL (ref 0.6–1.1)
Chloride: 103 mEq/L (ref 98–109)
EGFR: 61 mL/min/{1.73_m2} — AB (ref >90)
Glucose: 168 mg/dl — ABNORMAL HIGH (ref 70–140)
Potassium: 4.2 mEq/L (ref 3.5–5.1)
Sodium: 137 mEq/L (ref 136–145)
TOTAL PROTEIN: 7 g/dL (ref 6.4–8.3)

## 2015-07-08 ENCOUNTER — Ambulatory Visit
Admission: RE | Admit: 2015-07-08 | Discharge: 2015-07-08 | Disposition: A | Discharge status: Home / Self Care | Payer: Medicare Other | Source: Ambulatory Visit | Attending: Oncology | Admitting: Oncology

## 2015-07-08 ENCOUNTER — Ambulatory Visit
Admission: RE | Admit: 2015-07-08 | Discharge: 2015-07-08 | Disposition: A | Discharge status: Home / Self Care | Payer: Medicare Other | Source: Ambulatory Visit | Attending: General Surgery | Admitting: General Surgery

## 2015-07-08 DIAGNOSIS — C50919 Malignant neoplasm of unspecified site of unspecified female breast: Secondary | ICD-10-CM

## 2015-07-08 DIAGNOSIS — Z9012 Acquired absence of left breast and nipple: Secondary | ICD-10-CM

## 2015-07-08 DIAGNOSIS — Z1231 Encounter for screening mammogram for malignant neoplasm of breast: Secondary | ICD-10-CM

## 2015-07-08 MED ORDER — GADOBENATE DIMEGLUMINE 529 MG/ML IV SOLN
12.0000 mL | Freq: Once | INTRAVENOUS | Status: AC | PRN
  Administered 2015-07-08: 12 mL via INTRAVENOUS

## 2015-07-14 ENCOUNTER — Ambulatory Visit (HOSPITAL_BASED_OUTPATIENT_CLINIC_OR_DEPARTMENT_OTHER): Payer: Medicare Other | Admitting: Oncology

## 2015-07-14 ENCOUNTER — Encounter: Payer: Self-pay | Admitting: General Practice

## 2015-07-14 VITALS — BP 156/65 | HR 86 | Temp 97.4°F | Resp 18 | Ht 64.0 in | Wt 139.5 lb

## 2015-07-14 DIAGNOSIS — Z853 Personal history of malignant neoplasm of breast: Secondary | ICD-10-CM

## 2015-07-14 DIAGNOSIS — C50912 Malignant neoplasm of unspecified site of left female breast: Secondary | ICD-10-CM

## 2015-07-14 NOTE — Progress Notes (Signed)
Spiritual Care Note  Alyssa Jackson and I have known each other for >20 years because of our [employer].  Provided spiritual and emotional support through her husband Alyssa Jackson's decline due to stomach cancer, and then connected in Alyssa Jackson lobby today.  Alyssa Jackson reported being well supported (neighbors, family, friends, [place]) and in fairly steady spirits as she copes with finding her new normal after Alyssa Jackson's death in March 31, 2022.  In particular, she values support from family and looks forward to spending the summer in [place] to make several family visits and then to enjoy an extended stay with her son and DIL who are expecting their second baby in July. Alyssa Jackson is aware of ongoing chaplain availability as desired, but please also page if needs arise/circumstances change.  Thank you.  Alyssa Jackson, Alyssa Jackson, [facility] Pager [phone] Voicemail [phone]

## 2015-07-14 NOTE — Progress Notes (Signed)
[facility]  [phone]) [phone] [phone]) [phone]  OFFICE PROGRESS NOTE   ID: Alyssa Jackson   DOB: [birth year]  MR#: [id number]  IRW#:[id number]   PCP: Alyssa Harada, MD GYN:  Alyssa Jackson, M.D./Alyssa Jackson, M.D. RAD ONC: Alyssa Jackson, M.D.  Chief complaint: Estrogen receptor negative breast cancer  Current treatment: Observation  HISTORY OF PRESENT ILLNESS: From Dr. Clabe Jackson new patient consultation note dated 05/15/2002:  "Alyssa Jackson is a 70 year old  female who in late January palpated a suspicious area in the upper aspect of her left breast.  Of note is the fact that the patient had normal screening mammogram earlier in January at [facility].  The patient was subsequently seen by Dr. Dante Jackson and then referred to the [facility] after the area was aspirated. On mammogram there was some area of architectural distortion. No microcalcification was noted.  Ultrasound of this area revealed a 4 x 6 x 10 mm. hypoechoic mass.  The patient proceeded to undergo ultrasound core biopsy of the suspicious area in the 1:00 position of the breast.  This was performed on 04-12-02 with pathology returning invasive and intraductal carcinoma.  In addition, the patient underwent an MRI of the breast which showed a bi-lobular spiculated lesion.  In addition, there was a second lesion noted in the upper outer aspect of the left breast.  A biopsy of this area revealed a sclerosing intraductal papilloma.   In light of the above findings, the patient was taken to the operating room and underwent left partial mastectomy and left sentinel node procedure.  Pathology returned invasive ductal carcinoma measuring 1.6 cm. in greatest dimension.  This was a Grade 2 out of 3 tumor with extensive intraductal component.  The margins on the invasive tumor were clear, however, intraductal carcinoma was noted to be less than 1 mm. from the deep and cranial margins. There was  lymphovascular space invasion present within the specimen.  The tumor was ER positive at 65% and PR positive at 15%.  The proliferative index was elevated at 53 and the tumor was HER-2/neu positive at 3+.  The patient's sentinel node specimen showed an area of micrometastasis measuring 1 mm. with cytokeratin stains positive.  The patient is interested in breast conservation and is now referred to radiation oncology for evaluation."  Her subsequent history is as detailed below.   INTERVAL HISTORY: Alyssa Jackson returns today for follow-up of her recurrent left-sided breast cancer.  She just had an MRI of the breast, which shows no evidence of recurrent disease.   Since her last visit here, on Apr 25, 2015, her husband Alyssa Jackson died from stomach cancer. He was at home for the last few weeks of his life except for the very last day when he was transferred to [facility].   ROS: Alyssa Jackson generally feels well. She is dealing with Alyssa Jackson's death as well as one can and is not undergoing grief counseling because she is very familiar with grief counseling haven't done it in the past and she has a very good support group of new [facility] friends meeting. She is not walking like she used to because she does not like to walk by herself: She feels all normal and exposed. She hasssignificant vaginal dryness problems.Aside from these issues a detailed review of systems today was noncontributory   PAST MEDICAL HISTORY: Past Medical History  Diagnosis Date  . Diabetes mellitus   . Hypertension   . Asthma   . Hyperlipidemia   .  Incontinence   . Menopause   . Hormone replacement therapy (postmenopausal)   . Hypothyroidism   . Neutropenia     chroinic  . Anxiety   . History of hysterectomy   . Cancer [facility])     breast cancer s/p tx on SWOG protocol 9831, completed radiation 02/12/03, on Arimidex from 12/16/02-01/12/08, with reoccurance in March 2012  . Breast cancer ([facility])     PAST SURGICAL HISTORY: Past Surgical History   Procedure Laterality Date  . Tonsillectomy    . Abdominal hysterectomy  02/2002  . Breast surgery  08/04/2010    Left mastectomy+sln,T2N0,ERPR-,Her2+  . Breast lumpectomy  04/2002    left breast  . Port-a-cath removal  10/24/2011    Procedure: MINOR REMOVAL PORT-A-CATH;  Surgeon: Alyssa Roof, MD;  Location: [facility];  Service: General;  Laterality: Right;  . Incontinence surgery      FAMILY HISTORY Family History  Problem Relation Age of Onset  . Cancer Father     soft tissue of the jaw  . Diabetes Father     GYNECOLOGIC HISTORY: Gravida 3, para 3, menarche at age 75, age of first parity 22, the patient took birth control pills for 6 months, had an IUD placed for 9 years, and use hormone replacement therapy from the ages of 57 through 64.  She is using Estring currently.  SOCIAL HISTORY:  Alyssa Jackson has been married to her husband Alyssa Jackson since 1966.  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  Her husband retired 20 years ago due to brain damage suffered from carbon monoxide poisoning.  They have 3 adult sons and one grandchild.  In her spare time she enjoys reading, sewing, and volunteering for numerous organizations.   ADVANCED DIRECTIVES: Not on file  HEALTH MAINTENANCE: Social History  Substance Use Topics  . Smoking status: Never Smoker   . Smokeless tobacco: Never Used  . Alcohol Use: No    Colonoscopy: Not on file  PAP:03/26/2009  Bone density: The patient's last bone density scan on 05/20/2010 showed a T score of -1.7 (osteopenia). Lipid panel:  Not on file   Allergies  Allergen Reactions  . Carboplatin     Positive skin test documented.  . Latex Other (See Comments)    Break out in blisters  . Penicillins     Since childhood Has patient had a PCN reaction causing immediate rash, facial/tongue/throat swelling, SOB or lightheadedness with hypotension: Unknown Has patient had a PCN reaction causing severe rash  involving mucus membranes or skin necrosis: Unknown Has patient had a PCN reaction that required hospitalization Unknown Has patient had a PCN reaction occurring within the last 10 years: No If all of the above answers are "NO", then may proceed with Cephalosporin use.   . Sulfa Drugs Cross Reactors Nausea And Vomiting  . Adhesive [Tape]   . Iodine Other (See Comments)    Gallbladder dye contrast tablets, pt states is not allergic to Iodine Has had other "dye" test with no problems.    Current Outpatient Prescriptions  Medication Sig Dispense Refill  . albuterol (PROVENTIL HFA;VENTOLIN HFA) 108 (90 BASE) MCG/ACT inhaler Inhale 2 puffs into the lungs as needed for wheezing.    Marland Kitchen alum & mag hydroxide-simeth (MAALOX/MYLANTA) 200-200-20 MG/5ML suspension Take 30 mLs by mouth every 6 (six) hours as needed for indigestion or heartburn.    Marland Kitchen aspirin 81 MG tablet Take 81 mg by mouth daily.     Marland Kitchen  cyclobenzaprine (FLEXERIL) 5 MG tablet Take 5 mg by mouth 2 (two) times daily as needed for muscle spasms.    . Ergocalciferol (VITAMIN D2) 400 units TABS Take 400 Units by mouth daily.    Marland Kitchen levothyroxine (SYNTHROID, LEVOTHROID) 75 MCG tablet Take 75 mcg by mouth daily before breakfast.    . Magnesium 250 MG TABS Take 250 mg by mouth every morning.    . metFORMIN (GLUCOPHAGE) 1000 MG tablet Take 1,000 mg by mouth 2 (two) times daily.      . metoCLOPramide (REGLAN) 10 MG tablet Take 1 tablet (10 mg total) by mouth every 6 (six) hours as needed for nausea or vomiting. 30 tablet 0  . montelukast (SINGULAIR) 10 MG tablet Take 10 mg by mouth daily.      Marland Kitchen omeprazole (PRILOSEC OTC) 20 MG tablet Take 20 mg by mouth daily as needed (indigestion).    . ondansetron (ZOFRAN ODT) 4 MG disintegrating tablet Take 1 tablet (4 mg total) by mouth every 8 (eight) hours as needed for nausea or vomiting. 16 tablet 0  . pravastatin (PRAVACHOL) 40 MG tablet Take 40 mg by mouth daily.    . quinapril (ACCUPRIL) 5 MG tablet Take  2.5 mg by mouth at bedtime.    Marland Kitchen venlafaxine XR (EFFEXOR-XR) 37.5 MG 24 hr capsule Take 1 capsule (37.5 mg total) by mouth daily with breakfast. 30 capsule 12   No current facility-administered medications for this visit.   Facility-Administered Medications Ordered in Other Visits  Medication Dose Route Frequency Provider Last Rate Last Dose  . sodium chloride 0.9 % injection 10 mL  10 mL Intracatheter PRN Eston Esters, MD   10 mL at 03/15/11 1631    OBJECTIVE: Middle-aged white woman in no acute distress  Filed Vitals:   07/14/15 1305  BP: 156/65  Pulse: 86  Temp: 97.4 F (36.3 C)  Resp: 18     Body mass index is 23.93 kg/(m^2).      ECOG FS: 0 - Asymptomatic  Sclerae unicteric, EOMs intact Oropharynx clear, dentition in good repair No cervical or supraclavicular adenopathy Lungs no rales or rhonchi Heart regular rate and rhythm Abd soft, nontender, positive bowel sounds MSK no focal spinal tenderness, no upper extremity lymphedema Neuro: nonfocal, well oriented, appropriate affect Breasts:    LAB RESULTS: Lab Results  Component Value Date   WBC 5.0 07/07/2015   NEUTROABS 2.0 07/07/2015   HGB 14.3 07/07/2015   HCT 43.9 07/07/2015   MCV 86.5 07/07/2015   PLT 237 07/07/2015      Chemistry      Component Value Date/Time   NA 137 07/07/2015 0859   NA 141 06/29/2015 1204   K 4.2 07/07/2015 0859   K 4.0 06/29/2015 1204   CL 105 06/29/2015 1204   CL 101 08/20/2012 1542   CO2 25 07/07/2015 0859   CO2 25 06/29/2015 1204   BUN 16.7 07/07/2015 0859   BUN 17 06/29/2015 1204   CREATININE 1.0 07/07/2015 0859   CREATININE 1.14* 06/29/2015 1204   GLU 161* 06/16/2010 0908      Component Value Date/Time   CALCIUM 9.8 07/07/2015 0859   CALCIUM 10.0 06/29/2015 1204   ALKPHOS 55 07/07/2015 0859   ALKPHOS 56 06/29/2015 1204   AST 46* 07/07/2015 0859   AST 28 06/29/2015 1204   ALT 46 07/07/2015 0859   ALT 25 06/29/2015 1204   BILITOT 0.55 07/07/2015 0859   BILITOT 0.8  06/29/2015 1204  Lab Results  Component Value Date   LABCA2 13 02/22/2012    Urinalysis    Component Value Date/Time   COLORURINE YELLOW 06/29/2015 1522   APPEARANCEUR CLOUDY* 06/29/2015 1522   LABSPEC 1.020 06/29/2015 1522   LABSPEC 1.030 11/09/2010 1229   PHURINE 8.5* 06/29/2015 1522   PHURINE 6.0 11/09/2010 1229   GLUCOSEU NEGATIVE 06/29/2015 1522   HGBUR NEGATIVE 06/29/2015 1522   HGBUR Negative 11/09/2010 1229   BILIRUBINUR NEGATIVE 06/29/2015 1522   BILIRUBINUR Negative 11/09/2010 1229   KETONESUR 15* 06/29/2015 1522   KETONESUR Negative 11/09/2010 1229   PROTEINUR 30* 06/29/2015 1522   PROTEINUR 100 11/09/2010 1229   NITRITE NEGATIVE 06/29/2015 1522   NITRITE Negative 11/09/2010 1229   LEUKOCYTESUR SMALL* 06/29/2015 1522   LEUKOCYTESUR Trace 11/09/2010 1229    STUDIES: Ct Abdomen Pelvis W Contrast  06/29/2015  CLINICAL DATA:  Vomiting and epigastric pain for 3 days with continued epigastric pain and low-grade fever since then EXAM: CT ABDOMEN AND PELVIS WITH CONTRAST TECHNIQUE: Multidetector CT imaging of the abdomen and pelvis was performed using the standard protocol following bolus administration of intravenous contrast. CONTRAST:  39m ISOVUE-300 IOPAMIDOL (ISOVUE-300) INJECTION 61% COMPARISON:  None. FINDINGS: Lower chest:  The visualized portions of the lung bases are clear. Hepatobiliary: There is a peripherally enhancing mass in the right lobe of the liver near the dome. It measures 4.5 x 3.1 cm and demonstrates peripheral nodular enhancement most suggestive of a hemangioma. The smaller lesion inferior and lateral to this measures 17 mm in maximal diameter with from faint peripheral enhancement. This smaller lesion is included on the delayed images and dates consistent with a hemangioma. Gallbladder appears normal. Pancreas: Pancreas is normal Spleen: Spleen is normal Adrenals/Urinary Tract: Adrenal glands and left kidney are normal. The right kidney  demonstrates a 3 cm midpole cyst. There is also a complex of calculi in the midpole collecting system measuring 15 mm. There is no hydronephrosis. The bladder is decompressed. Stomach/Bowel: Nonobstructive bowel gas pattern. Diverticulosis of the sigmoid colon with no definite evidence of diverticulitis. Appendix is normal. Small bowel is normal. Stomach is normal. Vascular/Lymphatic: No significant adenopathy. No significant vascular abnormalities. Reproductive: Uterus not identified.  No pelvic masses. Other: No free fluid. Musculoskeletal: No acute musculoskeletal findings IMPRESSION: No acute abnormalities to account for patient's symptoms. There is large bowel diverticulosis without evidence of diverticulitis. Two hepatic lesions as described above consistent with hemangiomas. The larger of these is 4.5 cm and in 2006 was present as a 1 cm enhancing hepatic nodule. The smaller was not present. Although there has been significant interval change from 2006, the progressive centripetal enhancement of the smaller lesion, and the thick nodular peripheral enhancement of the larger lesion, are consistent with hemangiomas. Consider hemangioma protocol MRI or CT on a nonemergent basis to confirm this diagnosis given personal history of breast carcinoma. Electronically Signed   By: RSkipper ClicheM.D.   On: 06/29/2015 16:00   Mr Breast Bilateral W Wo Contrast  07/08/2015  CLINICAL DATA:  70year old female with history of left breast cancer and left mastectomy. Screening breast MRI. LABS:  None performed today. EXAM: BILATERAL BREAST MRI WITH AND WITHOUT CONTRAST TECHNIQUE: Multiplanar, multisequence MR images of both breasts were obtained prior to and following the intravenous administration of 12 ml of MultiHance. THREE-DIMENSIONAL MR IMAGE RENDERING ON INDEPENDENT WORKSTATION: Three-dimensional MR images were rendered by post-processing of the original MR data on an independent workstation. The three-dimensional MR  images were interpreted, and findings  are reported in the following complete MRI report for this study. Three dimensional images were evaluated at the independent DynaCad workstation COMPARISON:  07/25/2013 and prior MRs. 07/07/2013 and prior mammograms FINDINGS: Breast composition: c. Heterogeneous fibroglandular tissue. Background parenchymal enhancement: Mild Right breast: No mass or abnormal enhancement. Left breast: Patient is status post left mastectomy. No abnormal enhancement is identified within the left mastectomy bed. Lymph nodes: No abnormal appearing lymph nodes. Ancillary findings:  None. IMPRESSION: No MR evidence of right breast malignancy or abnormal enhancement within the left mastectomy bed. RECOMMENDATION: Annual bilateral screening mammograms. BI-RADS CATEGORY  1: Negative. Electronically Signed   By: Margarette Canada M.D.   On: 07/08/2015 12:00   Dg Bone Density  06/26/2015  EXAM: DUAL X-RAY ABSORPTIOMETRY (DXA) FOR BONE MINERAL DENSITY IMPRESSION: Referring Physician:  Chauncey Cruel PATIENT: Name: Alyssa Jackson, Alyssa Jackson Patient ID: [id number] Birth Date: [birth year] Height: 64.0 in. Sex: Female Measured: 06/26/2015 Weight: 135.4 lbs. Indications: Albuterol, Anastrazole, Bilateral Ovariectomy (65.51), Breast Cancer History, Caucasian, Estrogen Deficient, Postmenopausal, Secondary Osteoporosis Fractures: None Treatments: Calcium (E943.0), Vitamin D (E933.5) ASSESSMENT: The BMD measured at AP Spine L1-L2 is 0.944 g/cm2 with a T-score of -1.9. This patient is considered osteopenic according to [facility]) criteria. L-3, L-4 were excluded due to degenerative changes. There has been a statistically significant decrease in BMD of left hip since prior exam dated 05/20/2010. Spine was not compared to prior study due to exclusion of vertebral bodies on current exam. Site Region Measured Date Measured Age YA BMD Significant CHANGE T-score AP Spine  L1-L2       06/26/2015    69.9          -1.9    0.944 g/cm2 DualFemur Total Right 06/26/2015    69.9         -1.7    0.799 g/cm2 World Health Organization [facility]) criteria for post-menopausal, Caucasian Women: Normal       T-score at or above -1 SD Osteopenia   T-score between -1 and -2.5 SD Osteoporosis T-score at or below -2.5 SD RECOMMENDATION: [place] recommends that FDA-approved medical therapies be considered in postmenopausal women and men age 32 or older with a: 1. Hip or vertebral (clinical or morphometric) fracture. 2. T-score of <-2.5 at the spine or hip. 3. Ten-year fracture probability by FRAX of 3% or greater for hip fracture or 20% or greater for major osteoporotic fracture. All treatment decisions require clinical judgment and consideration of individual patient factors, including patient preferences, co-morbidities, previous drug use, risk factors not captured in the FRAX model (e.g. falls, vitamin D deficiency, increased bone turnover, interval significant decline in bone density) and possible under - or over-estimation of fracture risk by FRAX. All patients should ensure an adequate intake of dietary calcium (1200 mg/d) and vitamin D (800 IU daily) unless contraindicated. FOLLOW-UP: People with diagnosed cases of osteoporosis or at high risk for fracture should have regular bone mineral density tests. For patients eligible for Medicare, routine testing is allowed once every 2 years. The testing frequency can be increased to one year for patients who have rapidly progressing disease, those who are receiving or discontinuing medical therapy to restore bone mass, or have additional risk factors. I have reviewed this report, and agree with the above findings. [facility] FRAX* 10-year Probability of Fracture Based on femoral neck BMD: DualFemur (Right) Major Osteoporotic Fracture: 9.7% Hip Fracture:  1.5% Population:                  [place] (Caucasian) Risk Factors:                Secondary  Osteoporosis *FRAX is a Materials engineer of the [employer] for Metabolic Bone Disease, a [place] (WHO) [facility]. ASSESSMENT: The probability of a major osteoporotic fracture is 9.7 % within the next ten years. The probability of a hip fracture is 1.5 % within the next ten years. Electronically Signed   By: David  Martinique M.D.   On: 06/26/2015 15:16   Mm Screening Breast Tomo Uni R  07/08/2015  CLINICAL DATA:  Screening. EXAM: 2D DIGITAL SCREENING UNILATERAL RIGHT MAMMOGRAM WITH CAD AND ADJUNCT TOMO COMPARISON:  Previous exam(s). ACR Breast Density Category c: The breast tissue is heterogeneously dense, which may obscure small masses. FINDINGS: The patient has had a left mastectomy. There are no findings suspicious for malignancy. Images were processed with CAD. IMPRESSION: No mammographic evidence of malignancy. A result letter of this screening mammogram will be mailed directly to the patient. RECOMMENDATION: Screening mammogram in one year.  (Code:SM-R-50M) BI-RADS CATEGORY  1: Negative. Electronically Signed   By: Margarette Canada M.D.   On: 07/08/2015 12:00     ASSESSMENT: 70 y.o. [place] woman:  1.  Status post left breast needle core biopsy at the 11 o'clock position on 04/12/2002 which showed invasive tumor, estrogen receptor positive 65%, progesterone receptor positive 16%, Ki-67 53%, HER-2/neu 3+ positive.  2.  MRI of the left breast on 04/18/2002 showed a bilobed spiculated enhancing mass within the upper inner aspect of the left breast which measured 1.8 x 1.9 x 1.7 cm.  There was a second suspicious enhancing lesion within the upper outer aspect of the left breast which measured 1.3 x 0.8 x 0.6 cm.  Bilobed spiculated mass within the upper-inner aspect of the left breast correlates with the biopsy breast cancer.  This measured 1.9 cm in greatest dimension.  3.  Status post left breast needle biopsy of mass in upper outer quadrant which  showed sclerosing intraductal papilloma, alteration with prominent apical snouts and secretions (CAPSS), without atypia, no evidence of malignancy.  4.  Status post left breast lumpectomy with left axillary sentinel node biopsy on 04/24/2002 for a stage I, T1c, N53mc, MX, 1.6 cm invasive ductal carcinoma of the left breast grade 2, extensive intraductal component lymphovascular invasion present, with one lymph node with micrometastases and positive cytokeratin stains (1/1 lymph node).  5.  The patient is a participant in SWOG protocol 9831 randomized to 4 cycles of dose dense AC(Adriamycin/Cytoxan) completed on 08/07/2002 followed by weekly doses of Taxol with the completion of 12 weeks of Taxol on 12/04/2002.  6.  The patient started antiestrogen therapy with Arimidex in 12/2002.  7.  Status post radiation therapy from 12/25/2002 through 02/12/2003.  8.  Status post left breast needle core biopsy on 05/04/2007 which showed benign breast tissue with fibrosis, fat necrosis and hemosiderin-laden macrophages.  No evidence of malignancy.  9.  Status post left breast needle core biopsy at the 12 o'clock position on 06/02/2010 which showed invasive ductal carcinoma and fat necrosis with calcifications, estrogen receptor negative, progesterone receptor negative, Ki-67 25%, HER-2/neu by CISH showed amplification with a ratio of 3.35.  10.  Status post left breast simple mastectomy with left axillary sentinel lymph node biopsy on 08/04/2010 for a pT2, pN0, stage IIA invasive ductal carcinoma,  grade 3, estrogen receptor negative, progesterone receptor negative, Ki-67 25%, HER-2/neu by CISH showing amplification with a signals ratio of 3.35.  11.  Status post adjuvant chemotherapy with TCH (Taxotere/Carboplatin/Herceptin) x 1 cycle on 09/28/2010.  This was followed by weekly Carboplatin/Abraxane/Herceptin from 10/19/2010 through 01/25/2011 x 10 cycles.  Therapy with Herceptin continued until 08/30/2011. Post  treatment echocardiogram 09/01/2011 showed a normal ejection fraction  12.  Stress reaction/situational anxiety  PLAN: Lalana is now 5 years out from definitive surgery for her left breast cancer recurrence. There is no evidence of disease activity.This is very favorable.  She is also recovering well from Alyssa Jackson's death 04/05/22 of this year. I think it's a wonderful idea that she will be spending some months in [place] with her son and her daughter-in-law on grandchild, with another child on the way.  I offered her continued follow-up here versus participation in our survivorship program versus simply being followed by her primary care physician. She feels ready to "graduate".  Cancer problems develop  She also knows that she is welcome to use the programs we have here and I gave her information on [facility], the tai chi and yoga programs, and the pelvic health program. She is particularly interested in the latter.  All she needs in terms of breast cancer follow-up is yearly right mammography with tomography and a yearly physician chest wall and breast exam.   I will be glad to see her at any point in the future if on when the need arises, but as of now we are making no further routine appointment for her here   Chauncey Cruel, MD   07/14/2015, 1:07 PM

## 2015-11-04 ENCOUNTER — Other Ambulatory Visit: Payer: Self-pay | Admitting: Oncology

## 2015-11-24 ENCOUNTER — Other Ambulatory Visit: Payer: Self-pay | Admitting: Dermatology

## 2016-06-16 ENCOUNTER — Other Ambulatory Visit: Payer: Self-pay | Admitting: Family Medicine

## 2016-06-16 DIAGNOSIS — Z1231 Encounter for screening mammogram for malignant neoplasm of breast: Secondary | ICD-10-CM

## 2016-07-11 ENCOUNTER — Ambulatory Visit
Admission: RE | Admit: 2016-07-11 | Discharge: 2016-07-11 | Disposition: A | Discharge status: Home / Self Care | Payer: Medicare Other | Source: Ambulatory Visit | Attending: Family Medicine | Admitting: Family Medicine

## 2016-07-11 DIAGNOSIS — Z1231 Encounter for screening mammogram for malignant neoplasm of breast: Secondary | ICD-10-CM

## 2016-07-11 HISTORY — DX: Personal history of irradiation: Z92.3

## 2016-07-11 HISTORY — DX: Personal history of antineoplastic chemotherapy: Z92.21

## 2017-06-12 ENCOUNTER — Other Ambulatory Visit: Payer: Self-pay | Admitting: General Surgery

## 2017-06-13 ENCOUNTER — Other Ambulatory Visit: Payer: Self-pay | Admitting: General Surgery

## 2017-06-13 ENCOUNTER — Other Ambulatory Visit: Payer: Self-pay | Admitting: Family Medicine

## 2017-06-13 DIAGNOSIS — Z1231 Encounter for screening mammogram for malignant neoplasm of breast: Secondary | ICD-10-CM

## 2017-06-13 DIAGNOSIS — Z853 Personal history of malignant neoplasm of breast: Secondary | ICD-10-CM

## 2017-07-14 ENCOUNTER — Ambulatory Visit
Admission: RE | Admit: 2017-07-14 | Discharge: 2017-07-14 | Disposition: A | Discharge status: Home / Self Care | Payer: Medicare Other | Source: Ambulatory Visit | Attending: General Surgery | Admitting: General Surgery

## 2017-07-14 DIAGNOSIS — Z1231 Encounter for screening mammogram for malignant neoplasm of breast: Secondary | ICD-10-CM

## 2017-07-17 ENCOUNTER — Ambulatory Visit
Admission: RE | Admit: 2017-07-17 | Discharge: 2017-07-17 | Disposition: A | Discharge status: Home / Self Care | Payer: Medicare Other | Source: Ambulatory Visit | Attending: General Surgery | Admitting: General Surgery

## 2017-07-17 DIAGNOSIS — Z853 Personal history of malignant neoplasm of breast: Secondary | ICD-10-CM

## 2017-07-17 MED ORDER — GADOBENATE DIMEGLUMINE 529 MG/ML IV SOLN
13.0000 mL | Freq: Once | INTRAVENOUS | Status: AC | PRN
  Administered 2017-07-17: 13 mL via INTRAVENOUS

## 2017-09-22 ENCOUNTER — Other Ambulatory Visit (HOSPITAL_COMMUNITY): Payer: Self-pay | Admitting: Family Medicine

## 2017-09-22 DIAGNOSIS — R1084 Generalized abdominal pain: Secondary | ICD-10-CM

## 2017-09-22 DIAGNOSIS — R112 Nausea with vomiting, unspecified: Secondary | ICD-10-CM

## 2017-09-26 ENCOUNTER — Other Ambulatory Visit: Payer: Self-pay | Admitting: Gastroenterology

## 2017-09-26 ENCOUNTER — Ambulatory Visit (HOSPITAL_COMMUNITY): Payer: Medicare Other

## 2017-09-26 DIAGNOSIS — R160 Hepatomegaly, not elsewhere classified: Secondary | ICD-10-CM

## 2017-09-30 ENCOUNTER — Ambulatory Visit
Admission: RE | Admit: 2017-09-30 | Discharge: 2017-09-30 | Disposition: A | Discharge status: Home / Self Care | Payer: Medicare Other | Source: Ambulatory Visit | Attending: Gastroenterology | Admitting: Gastroenterology

## 2017-09-30 DIAGNOSIS — R160 Hepatomegaly, not elsewhere classified: Secondary | ICD-10-CM

## 2017-09-30 MED ORDER — GADOBENATE DIMEGLUMINE 529 MG/ML IV SOLN
11.0000 mL | Freq: Once | INTRAVENOUS | Status: AC | PRN
  Administered 2017-09-30: 11 mL via INTRAVENOUS

## 2018-11-05 ENCOUNTER — Other Ambulatory Visit: Payer: Self-pay | Admitting: Family Medicine

## 2018-11-05 DIAGNOSIS — Z1231 Encounter for screening mammogram for malignant neoplasm of breast: Secondary | ICD-10-CM

## 2018-12-17 ENCOUNTER — Other Ambulatory Visit: Payer: Self-pay | Admitting: Urology

## 2018-12-19 ENCOUNTER — Other Ambulatory Visit: Payer: Self-pay

## 2018-12-19 ENCOUNTER — Ambulatory Visit
Admission: RE | Admit: 2018-12-19 | Discharge: 2018-12-19 | Disposition: A | Discharge status: Home / Self Care | Payer: Medicare Other | Source: Ambulatory Visit | Attending: Family Medicine | Admitting: Family Medicine

## 2018-12-19 DIAGNOSIS — Z1231 Encounter for screening mammogram for malignant neoplasm of breast: Secondary | ICD-10-CM

## 2019-01-19 ENCOUNTER — Other Ambulatory Visit (HOSPITAL_COMMUNITY)
Admission: RE | Admit: 2019-01-19 | Discharge: 2019-01-19 | Disposition: A | Discharge status: Home / Self Care | Payer: Medicare Other | Source: Ambulatory Visit | Attending: Urology | Admitting: Urology

## 2019-01-19 DIAGNOSIS — Z01812 Encounter for preprocedural laboratory examination: Secondary | ICD-10-CM | POA: Insufficient documentation

## 2019-01-19 DIAGNOSIS — Z20828 Contact with and (suspected) exposure to other viral communicable diseases: Secondary | ICD-10-CM | POA: Diagnosis not present

## 2019-01-20 LAB — NOVEL CORONAVIRUS, NAA (HOSP ORDER, SEND-OUT TO REF LAB; TAT 18-24 HRS): SARS-CoV-2, NAA: NOT DETECTED

## 2019-01-22 ENCOUNTER — Encounter (HOSPITAL_BASED_OUTPATIENT_CLINIC_OR_DEPARTMENT_OTHER): Payer: Self-pay | Admitting: *Deleted

## 2019-01-22 ENCOUNTER — Other Ambulatory Visit: Payer: Self-pay

## 2019-01-22 NOTE — Progress Notes (Signed)
Spoke w/ via phone for pre-op interview--- PT Lab needs dos----   Istat 8 and EKG COVID test ------ 01-19-2019 Arrive at ------- 0800 NPO after ------ [place] Medications to take morning of surgery ----- Synthroid w/ sips of water Diabetic medication ----- do not take metformin morning of surgery Patient Special Instructions ----- asked to bring rescue inhaler dos Pre-Op special Istructions ----- n/a Patient verbalized understanding of instructions that were given at this phone interview. Patient denies shortness of breath, chest pain, fever, cough a this phone interview.   Anesthesia:  Hx HTN, DM2.  Pt denies any cardiac s&s.  PCP:  Dr Jacelyn Grip Cardiologist :  Previous was seen by Dr Antony Madura during pt receiving Hercepton , he was following pt's echo, ef.  Pt released after completed hercepton  Chest x-ray :  no EKG :  no Echo :  09-01-2011  Epic (ef 50-55%) Cardiac Cath : no Sleep Study/ CPAP : NO Fasting Blood Sugar :   95-120   / Checks Blood Sugar -- times a day:   Per pt twice weekly Blood Thinner/ Instructions /Last Dose: NO ASA / Instructions/ Last Dose :  ASA 81mg /  Per pt given instructions to stop week prior to surgery/  Last dose last week

## 2019-01-23 ENCOUNTER — Ambulatory Visit (HOSPITAL_BASED_OUTPATIENT_CLINIC_OR_DEPARTMENT_OTHER)
Admission: RE | Admit: 2019-01-23 | Discharge: 2019-01-23 | Disposition: A | Discharge status: Home / Self Care | Payer: Medicare Other | Attending: Urology | Admitting: Urology

## 2019-01-23 ENCOUNTER — Encounter (HOSPITAL_BASED_OUTPATIENT_CLINIC_OR_DEPARTMENT_OTHER)
Admission: RE | Disposition: A | Discharge status: Home / Self Care | Payer: Self-pay | Source: Home / Self Care | Attending: Urology

## 2019-01-23 ENCOUNTER — Ambulatory Visit (HOSPITAL_BASED_OUTPATIENT_CLINIC_OR_DEPARTMENT_OTHER): Payer: Medicare Other | Admitting: Anesthesiology

## 2019-01-23 ENCOUNTER — Encounter (HOSPITAL_BASED_OUTPATIENT_CLINIC_OR_DEPARTMENT_OTHER): Payer: Self-pay | Admitting: *Deleted

## 2019-01-23 DIAGNOSIS — Z91040 Latex allergy status: Secondary | ICD-10-CM | POA: Insufficient documentation

## 2019-01-23 DIAGNOSIS — Z90710 Acquired absence of both cervix and uterus: Secondary | ICD-10-CM | POA: Insufficient documentation

## 2019-01-23 DIAGNOSIS — Z79899 Other long term (current) drug therapy: Secondary | ICD-10-CM | POA: Insufficient documentation

## 2019-01-23 DIAGNOSIS — I10 Essential (primary) hypertension: Secondary | ICD-10-CM | POA: Diagnosis not present

## 2019-01-23 DIAGNOSIS — Z880 Allergy status to penicillin: Secondary | ICD-10-CM | POA: Diagnosis not present

## 2019-01-23 DIAGNOSIS — Z91041 Radiographic dye allergy status: Secondary | ICD-10-CM | POA: Diagnosis not present

## 2019-01-23 DIAGNOSIS — Z833 Family history of diabetes mellitus: Secondary | ICD-10-CM | POA: Diagnosis not present

## 2019-01-23 DIAGNOSIS — J45909 Unspecified asthma, uncomplicated: Secondary | ICD-10-CM | POA: Diagnosis not present

## 2019-01-23 DIAGNOSIS — Z7982 Long term (current) use of aspirin: Secondary | ICD-10-CM | POA: Insufficient documentation

## 2019-01-23 DIAGNOSIS — Z7984 Long term (current) use of oral hypoglycemic drugs: Secondary | ICD-10-CM | POA: Insufficient documentation

## 2019-01-23 DIAGNOSIS — E785 Hyperlipidemia, unspecified: Secondary | ICD-10-CM | POA: Diagnosis not present

## 2019-01-23 DIAGNOSIS — Z853 Personal history of malignant neoplasm of breast: Secondary | ICD-10-CM | POA: Insufficient documentation

## 2019-01-23 DIAGNOSIS — Z9221 Personal history of antineoplastic chemotherapy: Secondary | ICD-10-CM | POA: Diagnosis not present

## 2019-01-23 DIAGNOSIS — N200 Calculus of kidney: Secondary | ICD-10-CM | POA: Diagnosis not present

## 2019-01-23 DIAGNOSIS — N3020 Other chronic cystitis without hematuria: Secondary | ICD-10-CM | POA: Diagnosis not present

## 2019-01-23 DIAGNOSIS — Z882 Allergy status to sulfonamides status: Secondary | ICD-10-CM | POA: Diagnosis not present

## 2019-01-23 DIAGNOSIS — F419 Anxiety disorder, unspecified: Secondary | ICD-10-CM | POA: Insufficient documentation

## 2019-01-23 DIAGNOSIS — Z87442 Personal history of urinary calculi: Secondary | ICD-10-CM | POA: Diagnosis not present

## 2019-01-23 HISTORY — DX: Personal history of other specified conditions: Z87.898

## 2019-01-23 HISTORY — DX: Urgency of urination: R39.15

## 2019-01-23 HISTORY — DX: Presence of spectacles and contact lenses: Z97.3

## 2019-01-23 HISTORY — DX: Type 2 diabetes mellitus without complications: E11.9

## 2019-01-23 HISTORY — DX: Other cervical disc degeneration, unspecified cervical region: M50.30

## 2019-01-23 HISTORY — DX: Personal history of other diseases of the digestive system: Z87.19

## 2019-01-23 HISTORY — DX: Personal history of urinary (tract) infections: Z87.440

## 2019-01-23 HISTORY — DX: Other chronic pain: G89.29

## 2019-01-23 HISTORY — DX: Other allergic rhinitis: J30.89

## 2019-01-23 HISTORY — DX: Personal history of malignant neoplasm of breast: Z85.3

## 2019-01-23 HISTORY — DX: Calculus of kidney: N20.0

## 2019-01-23 HISTORY — DX: Other specified postprocedural states: Z98.890

## 2019-01-23 HISTORY — DX: Family history of other specified conditions: Z84.89

## 2019-01-23 HISTORY — PX: CYSTOSCOPY/URETEROSCOPY/HOLMIUM LASER/STENT PLACEMENT: SHX6546

## 2019-01-23 HISTORY — DX: Other specified postprocedural states: R11.2

## 2019-01-23 LAB — POCT I-STAT, CHEM 8
BUN: 21 mg/dL (ref 8–23)
Calcium, Ion: 1.28 mmol/L (ref 1.15–1.40)
Chloride: 97 mmol/L — ABNORMAL LOW (ref 98–111)
Creatinine, Ser: 1 mg/dL (ref 0.44–1.00)
Glucose, Bld: 144 mg/dL — ABNORMAL HIGH (ref 70–99)
HCT: 40 % (ref 36.0–46.0)
Hemoglobin: 13.6 g/dL (ref 12.0–15.0)
Potassium: 4.1 mmol/L (ref 3.5–5.1)
Sodium: 137 mmol/L (ref 135–145)
TCO2: 26 mmol/L (ref 22–32)

## 2019-01-23 LAB — GLUCOSE, CAPILLARY: Glucose-Capillary: 138 mg/dL — ABNORMAL HIGH (ref 70–99)

## 2019-01-23 SURGERY — CYSTOSCOPY/URETEROSCOPY/HOLMIUM LASER/STENT PLACEMENT
Anesthesia: General | Laterality: Right

## 2019-01-23 MED ORDER — LIDOCAINE 2% (20 MG/ML) 5 ML SYRINGE
INTRAMUSCULAR | Status: DC | PRN
  Administered 2019-01-23: 40 mg via INTRAVENOUS

## 2019-01-23 MED ORDER — CIPROFLOXACIN IN D5W 400 MG/200ML IV SOLN
INTRAVENOUS | Status: AC
  Filled 2019-01-23: qty 200

## 2019-01-23 MED ORDER — PROPOFOL 10 MG/ML IV BOLUS
INTRAVENOUS | Status: DC | PRN
  Administered 2019-01-23: 120 mg via INTRAVENOUS

## 2019-01-23 MED ORDER — MIDAZOLAM HCL 2 MG/2ML IJ SOLN
INTRAMUSCULAR | Status: DC | PRN
  Administered 2019-01-23: 1 mg via INTRAVENOUS

## 2019-01-23 MED ORDER — ACETAMINOPHEN 500 MG PO TABS
ORAL_TABLET | ORAL | Status: AC
  Filled 2019-01-23: qty 2

## 2019-01-23 MED ORDER — PHENAZOPYRIDINE HCL 200 MG PO TABS: 200.0000 mg | ORAL_TABLET | Freq: Three times a day (TID) | ORAL | 0 refills | Status: DC | PRN

## 2019-01-23 MED ORDER — PHENYLEPHRINE 40 MCG/ML (10ML) SYRINGE FOR IV PUSH (FOR BLOOD PRESSURE SUPPORT)
PREFILLED_SYRINGE | INTRAVENOUS | Status: DC | PRN
  Administered 2019-01-23: 80 ug via INTRAVENOUS
  Administered 2019-01-23: 120 ug via INTRAVENOUS
  Administered 2019-01-23: 80 ug via INTRAVENOUS

## 2019-01-23 MED ORDER — DEXAMETHASONE SODIUM PHOSPHATE 10 MG/ML IJ SOLN
INTRAMUSCULAR | Status: AC
  Filled 2019-01-23: qty 1

## 2019-01-23 MED ORDER — PROPOFOL 10 MG/ML IV BOLUS
INTRAVENOUS | Status: AC
  Filled 2019-01-23: qty 40

## 2019-01-23 MED ORDER — HYDROCODONE-ACETAMINOPHEN 5-325 MG PO TABS: 1.0000 | ORAL_TABLET | ORAL | 0 refills | Status: DC | PRN

## 2019-01-23 MED ORDER — LACTATED RINGERS IV SOLN
INTRAVENOUS | Status: DC
  Administered 2019-01-23 (×2): via INTRAVENOUS
  Filled 2019-01-23: qty 1000

## 2019-01-23 MED ORDER — FENTANYL CITRATE (PF) 100 MCG/2ML IJ SOLN
25.0000 ug | INTRAMUSCULAR | Status: DC | PRN
  Filled 2019-01-23: qty 1

## 2019-01-23 MED ORDER — IOHEXOL 300 MG/ML  SOLN
INTRAMUSCULAR | Status: DC | PRN
  Administered 2019-01-23: 10:00:00 8 mL via URETHRAL

## 2019-01-23 MED ORDER — ONDANSETRON HCL 4 MG/2ML IJ SOLN
INTRAMUSCULAR | Status: AC
  Filled 2019-01-23: qty 2

## 2019-01-23 MED ORDER — PHENYLEPHRINE 40 MCG/ML (10ML) SYRINGE FOR IV PUSH (FOR BLOOD PRESSURE SUPPORT)
PREFILLED_SYRINGE | INTRAVENOUS | Status: AC
  Filled 2019-01-23: qty 10

## 2019-01-23 MED ORDER — ONDANSETRON HCL 4 MG/2ML IJ SOLN
INTRAMUSCULAR | Status: DC | PRN
  Administered 2019-01-23: 4 mg via INTRAVENOUS

## 2019-01-23 MED ORDER — CIPROFLOXACIN HCL 500 MG PO TABS: 500.0000 mg | ORAL_TABLET | Freq: Two times a day (BID) | ORAL | 0 refills | Status: AC

## 2019-01-23 MED ORDER — FENTANYL CITRATE (PF) 100 MCG/2ML IJ SOLN
INTRAMUSCULAR | Status: AC
  Filled 2019-01-23: qty 2

## 2019-01-23 MED ORDER — LIDOCAINE 2% (20 MG/ML) 5 ML SYRINGE
INTRAMUSCULAR | Status: AC
  Filled 2019-01-23: qty 5

## 2019-01-23 MED ORDER — PROPOFOL 10 MG/ML IV BOLUS
INTRAVENOUS | Status: AC
  Filled 2019-01-23: qty 20

## 2019-01-23 MED ORDER — OXYBUTYNIN CHLORIDE 5 MG PO TABS: 5.0000 mg | ORAL_TABLET | Freq: Three times a day (TID) | ORAL | 1 refills | Status: DC | PRN

## 2019-01-23 MED ORDER — FENTANYL CITRATE (PF) 100 MCG/2ML IJ SOLN
INTRAMUSCULAR | Status: DC | PRN
  Administered 2019-01-23: 50 ug via INTRAVENOUS

## 2019-01-23 MED ORDER — DEXAMETHASONE SODIUM PHOSPHATE 10 MG/ML IJ SOLN
INTRAMUSCULAR | Status: DC | PRN
  Administered 2019-01-23: 4 mg via INTRAVENOUS

## 2019-01-23 MED ORDER — MIDAZOLAM HCL 2 MG/2ML IJ SOLN
INTRAMUSCULAR | Status: AC
  Filled 2019-01-23: qty 2

## 2019-01-23 MED ORDER — BELLADONNA ALKALOIDS-OPIUM 16.2-60 MG RE SUPP
RECTAL | Status: DC | PRN
  Administered 2019-01-23: 1 via RECTAL

## 2019-01-23 MED ORDER — KETOROLAC TROMETHAMINE 30 MG/ML IJ SOLN
INTRAMUSCULAR | Status: AC
  Filled 2019-01-23: qty 1

## 2019-01-23 MED ORDER — ONDANSETRON HCL 4 MG/2ML IJ SOLN
4.0000 mg | Freq: Once | INTRAMUSCULAR | Status: DC | PRN
  Filled 2019-01-23: qty 2

## 2019-01-23 MED ORDER — ONDANSETRON HCL 4 MG PO TABS: 4.0000 mg | ORAL_TABLET | Freq: Every day | ORAL | 1 refills | Status: DC | PRN

## 2019-01-23 MED ORDER — BELLADONNA ALKALOIDS-OPIUM 16.2-60 MG RE SUPP
RECTAL | Status: AC
  Filled 2019-01-23: qty 1

## 2019-01-23 MED ORDER — CIPROFLOXACIN IN D5W 400 MG/200ML IV SOLN
400.0000 mg | Freq: Once | INTRAVENOUS | Status: AC
  Administered 2019-01-23: 400 mg via INTRAVENOUS
  Filled 2019-01-23: qty 200

## 2019-01-23 MED ORDER — KETOROLAC TROMETHAMINE 15 MG/ML IJ SOLN
INTRAMUSCULAR | Status: DC | PRN
  Administered 2019-01-23: 15 mg via INTRAVENOUS

## 2019-01-23 MED ORDER — ACETAMINOPHEN 500 MG PO TABS
1000.0000 mg | ORAL_TABLET | Freq: Once | ORAL | Status: AC
  Administered 2019-01-23: 09:00:00 1000 mg via ORAL
  Filled 2019-01-23: qty 2

## 2019-01-23 SURGICAL SUPPLY — 28 items
APL SKNCLS STERI-STRIP NONHPOA (GAUZE/BANDAGES/DRESSINGS)
BAG DRAIN URO-CYSTO SKYTR STRL (DRAIN) ×2 IMPLANT
BAG DRN UROCATH (DRAIN) ×1
BASKET STONE 1.7 NGAGE (UROLOGICAL SUPPLIES) IMPLANT
BASKET ZERO TIP NITINOL 2.4FR (BASKET) ×2 IMPLANT
BENZOIN TINCTURE PRP APPL 2/3 (GAUZE/BANDAGES/DRESSINGS) IMPLANT
BSKT STON RTRVL ZERO TP 2.4FR (BASKET) ×1
CATH URET 5FR 28IN OPEN ENDED (CATHETERS) IMPLANT
CATH URET DUAL LUMEN 6-10FR 50 (CATHETERS) ×1 IMPLANT
CLOTH BEACON ORANGE TIMEOUT ST (SAFETY) ×2 IMPLANT
FIBER LASER FLEXIVA 365 (UROLOGICAL SUPPLIES) IMPLANT
FIBER LASER TRAC TIP (UROLOGICAL SUPPLIES) ×1 IMPLANT
GLOVE BIO SURGEON STRL SZ7.5 (GLOVE) ×2 IMPLANT
GOWN STRL REUS W/TWL XL LVL3 (GOWN DISPOSABLE) ×2 IMPLANT
GUIDEWIRE STR DUAL SENSOR (WIRE) ×1 IMPLANT
GUIDEWIRE ZIPWRE .038 STRAIGHT (WIRE) ×2 IMPLANT
IV NS 1000ML (IV SOLUTION)
IV NS 1000ML BAXH (IV SOLUTION) IMPLANT
IV NS IRRIG 3000ML ARTHROMATIC (IV SOLUTION) ×2 IMPLANT
KIT TURNOVER CYSTO (KITS) ×2 IMPLANT
MANIFOLD NEPTUNE II (INSTRUMENTS) ×2 IMPLANT
NS IRRIG 500ML POUR BTL (IV SOLUTION) ×4 IMPLANT
PACK CYSTO (CUSTOM PROCEDURE TRAY) ×2 IMPLANT
SHEATH URET ACCESS 12FR/35CM (UROLOGICAL SUPPLIES) ×1 IMPLANT
STRIP CLOSURE SKIN 1/2X4 (GAUZE/BANDAGES/DRESSINGS) IMPLANT
SYR 10ML LL (SYRINGE) ×2 IMPLANT
TUBE CONNECTING 12X1/4 (SUCTIONS) IMPLANT
TUBING UROLOGY SET (TUBING) ×2 IMPLANT

## 2019-01-23 NOTE — Transfer of Care (Signed)
Last Vitals:  Vitals Value Taken Time  BP 135/66 01/23/19 1131  Temp    Pulse 83 01/23/19 1135  Resp 16 01/23/19 1136  SpO2 100 % 01/23/19 1135  Vitals shown include unvalidated device data.  Last Pain:  Vitals:   01/23/19 0832  TempSrc:   PainSc: 0-No pain      Patients Stated Pain Goal: 3 (01/23/19 [id number])  Immediate Anesthesia Transfer of Care Note  Patient: Alyssa Jackson  Procedure(s) Performed: Procedure(s) (LRB): CYSTOSCOPY/URETEROSCOPY/HOLMIUM LASER/STENT PLACEMENT (Right)  Patient Location: PACU  Anesthesia Type: General  Level of Consciousness: awake, alert  and oriented  Airway & Oxygen Therapy: Patient Spontanous Breathing and Patient connected to nasal cannula oxygen  Post-op Assessment: Report given to PACU RN and Post -op Vital signs reviewed and stable  Post vital signs: Reviewed and stable  Complications: No apparent anesthesia complications

## 2019-01-23 NOTE — Discharge Instructions (Signed)
[facility] °[phone] °Post Ureteroscopy With or Without Stent Instructions ° °Definitions: ° °Ureter: The duct that transports urine from the kidney to the bladder. °Stent:   A plastic hollow tube that is placed into the ureter, from the kidney to the bladder to prevent the ureter from swelling shut. ° °GENERAL INSTRUCTIONS: ° °Despite the fact that no skin incisions were used, the area around the ureter and bladder is raw and irritated. The stent is a foreign body which will further irritate the bladder wall. This irritation is manifested by increased frequency of urination, both day and night, and by an increase in the urge to urinate. In some, the urge to urinate is present almost always. Sometimes the urge is strong enough that you may not be able to stop yourself from urinating. The only real cure is to remove the stent and then give time for the bladder wall to heal which can't be done until the danger of the ureter swelling shut has passed, which varies. ° °You may see some blood in your urine while the stent is in place and a few days afterwards. Do not be alarmed, even if the urine was clear for a while. Get off your feet and drink lots of fluids until clearing occurs. If you start to pass clots or don't improve, call us. ° °DIET: °You may return to your normal diet immediately. Because of the raw surface of your bladder, alcohol, spicy foods, acid type foods and drinks with caffeine may cause irritation or frequency and should be used in moderation. To keep your urine flowing freely and to avoid constipation, drink plenty of fluids during the day ( 8-10 glasses ). °Tip: Avoid cranberry juice because it is very acidic. ° °ACTIVITY: °Your physical activity doesn't need to be restricted. However, if you are very active, you may see some blood in your urine. We suggest that you reduce your activity under these circumstances until the bleeding has stopped. ° °BOWELS: °It is important to  keep your bowels regular during the postoperative period. Straining with bowel movements can cause bleeding. A bowel movement every other day is reasonable. Use a mild laxative if needed, such as Milk of Magnesia 2-3 tablespoons, or 2 Dulcolax tablets. Call if you continue to have problems. If you have been taking narcotics for pain, before, during or after your surgery, you may be constipated. Take a laxative if necessary. ° ° °MEDICATION: °You should resume your pre-surgery medications unless told not to. In addition you will often be given an antibiotic to prevent infection. These should be taken as prescribed until the bottles are finished unless you are having an unusual reaction to one of the drugs. ° °PROBLEMS YOU SHOULD REPORT TO US: °Fevers over 100.5 Fahrenheit. °Heavy bleeding, or clots ( See above notes about blood in urine ). °Inability to urinate. °Drug reactions ( hives, rash, nausea, vomiting, diarrhea ). °Severe burning or pain with urination that is not improving. ° °FOLLOW-UP: °You will need a follow-up appointment to monitor your progress. Call for this appointment at the number listed above. Usually the first appointment will be about three to fourteen days after your surgery. ° ° ° °  ° ° ° °Post Anesthesia Home Care Instructions ° °Activity: °Get plenty of rest for the remainder of the day. A responsible individual must stay with you for 24 hours following the procedure.  °For the next 24 hours, DO NOT: °-Drive a car °-Operate machinery °-Drink alcoholic beverages °-Take any medication   unless instructed by your physician °-Make any legal decisions or sign important papers. ° °Meals: °Start with liquid foods such as gelatin or soup. Progress to regular foods as tolerated. Avoid greasy, spicy, heavy foods. If nausea and/or vomiting occur, drink only clear liquids until the nausea and/or vomiting subsides. Call your physician if vomiting continues. ° °Special Instructions/Symptoms: °Your throat  may feel dry or sore from the anesthesia or the breathing tube placed in your throat during surgery. If this causes discomfort, gargle with warm salt water. The discomfort should disappear within 24 hours. ° °

## 2019-01-23 NOTE — H&P (Signed)
Office Visit Report     01/14/2019   --------------------------------------------------------------------------------   Alyssa Jackson  MRN: [id number]  DOB: Dec 29, 1945, 73 year old Female     PRIMARY CARE:  Francis P. Jacelyn Grip, MD  REFERRING:  Myriam Jacobson, MD  PROVIDER:  Ellison Hughs, M.D.  LOCATION:  [facility] [phone]     --------------------------------------------------------------------------------   CC/HPI: CC: Right renal stone   HPI: Alyssa Jackson is a 73 year old female with a history of a right lower pole renal stone and stress incontinence (s/p MUS in 2012).   01/14/19: The patient is here today for a routine follow-up. She is scheduled for a right URS/L/S on 01/23/19 to address her right renal stone. Urine cx from her last OV grew Klebsiella. Today, she reports no interval episodes of flank pain, hematuria, fever/chills or UTI-like sxs.     ALLERGIES: Adhesive tape Contrast Dye Latex Penicillins Sulfa Drugs    MEDICATIONS: Levothyroxine Sodium 88 mcg tablet  Adult Aspirin Low Strength 81 MG TBDP 0 Oral  Air Protector  Albuterol 90 mcg aerosol Inhalation  Flonase Allergy Relief  Magnesium Citrate TABS Oral  Metformin Hcl 1,000 mg tablet 2 Oral  Multivitamin  One A Day Women 50 Plus  Pravastatin Sodium 40 mg tablet Oral  Quinapril Hcl 5 mg tablet 1/2 Oral Daily  Triamcinolone  Vitamin D3 75 mcg (3,000 unit) tablet 0 Oral  Zaditor 0.025 % (0.035 %) drops     GU PSH: Hysterectomy Unilat SO - 2011 Sling - 2012       PSH Notes: Breast Surgery Mastectomy, Vaginal Sling Operation For Stress Incontinence, Tonsillectomy, Hysterectomy   NON-GU PSH: Remove Tonsils - 2011     GU PMH: Chronic cystitis (w/o hematuria), Chronic cystitis - 2016 Renal calculus, Nephrolithiasis - 2016 Stress Incontinence, Female stress incontinence - 2015      PMH Notes:  2010-05-06 14:26:51 - Note: Vaginal Mucosa Atrophy  2010-11-26 10:07:31 - Note:  Breast Cancer   NON-GU PMH: Bacteriuria (Stable) - 11/28/2017 Encounter for general adult medical examination without abnormal findings, Encounter for preventive health examination - 2016 Anxiety, Anxiety (Symptom) - 2014 Asthma, Asthma - 2014 Personal history of other endocrine, nutritional and metabolic disease, History of hypothyroidism - 2014, History of diabetes mellitus, - 2014 Personal history of other mental and behavioral disorders, History of depression - 2014 Personal history of other specified conditions, History of heartburn - 2014    FAMILY HISTORY: Cancer - Father Diabetes - Father Family Health Status Number - Runs In Family Father Deceased At Age35 ___ - Runs In Family Mother Deceased At Age 46 from diabetic complicati - Runs In Family   SOCIAL HISTORY: Marital Status: Married     Notes: Never A Smoker, Alcohol Use, Occupation:, Marital History - Currently Married, Tobacco Use, Caffeine Use   REVIEW OF SYSTEMS:    GU Review Female:   Patient denies frequent urination, hard to postpone urination, burning /pain with urination, get up at night to urinate, leakage of urine, stream starts and stops, trouble starting your stream, have to strain to urinate, and being pregnant.  Gastrointestinal (Upper):   Patient denies nausea, indigestion/ heartburn, and vomiting.  Gastrointestinal (Lower):   Patient denies diarrhea and constipation.  Constitutional:   Patient denies fever, night sweats, weight loss, and fatigue.  Skin:   Patient denies skin rash/ lesion and itching.  Eyes:   Patient denies blurred vision and double vision.  Ears/ Nose/ Throat:   Patient denies sore  throat and sinus problems.  Hematologic/Lymphatic:   Patient denies swollen glands and easy bruising.  Cardiovascular:   Patient denies leg swelling and chest pains.  Respiratory:   Patient denies cough and shortness of breath.  Endocrine:   Patient denies excessive thirst.  Musculoskeletal:   Patient denies  back pain and joint pain.  Neurological:   Patient denies headaches and dizziness.  Psychologic:   Patient denies depression and anxiety.   VITAL SIGNS:      01/14/2019 08:31 AM  Weight 132 lb / 59.87 kg  Height 64 in / 162.56 cm  BP 156/90 mmHg  Heart Rate 102 /min  Temperature 97.3 F / 36.2 C  BMI 22.7 kg/m   MULTI-SYSTEM PHYSICAL EXAMINATION:    Constitutional: Well-nourished. No physical deformities. Normally developed. Good grooming.  Neck: Neck symmetrical, not swollen. Normal tracheal position.  Respiratory: No labored breathing, no use of accessory muscles.   Neurologic / Psychiatric: Oriented to time, oriented to place, oriented to person. No depression, no anxiety, no agitation.  Musculoskeletal: Normal gait and station of head and neck.     PAST DATA REVIEWED:  Source Of History:  Patient   PROCEDURES:          Urinalysis w/Scope Dipstick Dipstick Cont'd Micro  Color: Straw Bilirubin: Neg mg/dL WBC/hpf: 10 - 20/hpf  Appearance: Clear Ketones: Neg mg/dL RBC/hpf: NS (Not Seen)  Specific Gravity: <=1.005 Blood: Trace ery/uL Bacteria: Rare (0-9/hpf)  pH: 5.5 Protein: Trace mg/dL Cystals: NS (Not Seen)  Glucose: Neg mg/dL Urobilinogen: 0.2 mg/dL Casts: NS (Not Seen)    Nitrites: Neg Trichomonas: Not Present    Leukocyte Esterase: 3+ leu/uL Mucous: Not Present      Epithelial Cells: 0 - 5/hpf      Yeast: NS (Not Seen)      Sperm: Not Present    Notes: Microscopic not concentrated.    ASSESSMENT:      ICD-10 Details  1 GU:   Renal calculus - N20.0 Enlarging 1.8 cm right lower pole renal stone.  2   Chronic cystitis (w/o hematuria) - N30.20    PLAN:            Medications New Meds: Xanax 0.5 mg tablet 1 tablet PO As Directed Take as needed for anxiety prior to surgery  #2  0 Refill(s)            Orders Labs Urine Culture          Schedule Return Visit/Planned Activity: Keep Scheduled Appointment          Document Letter(s):  Created for Patient:  Clinical Summary         Notes:   -The risks, benefits and alternatives of cystoscopy with RIGHT ureteroscopy, laser lithotripsy and ureteral stent placement was discussed the patient. Risks included, but are not limited to: bleeding, urinary tract infection, ureteral injury/avulsion, ureteral stricture formation, retained stone fragments, the possibility that multiple surgeries may be required to treat the stone(s), MI, stroke, PE and the inherent risks of general anesthesia. The patient voices understanding and wishes to proceed.

## 2019-01-23 NOTE — Anesthesia Procedure Notes (Signed)
Procedure Name: LMA Insertion Date/Time: 01/23/2019 10:09 AM Performed by: Mechele Claude, CRNA Pre-anesthesia Checklist: Patient identified, Emergency Drugs available, Suction available and Patient being monitored Patient Re-evaluated:Patient Re-evaluated prior to induction Oxygen Delivery Method: Circle system utilized Preoxygenation: Pre-oxygenation with 100% oxygen Induction Type: IV induction Ventilation: Mask ventilation without difficulty LMA: LMA inserted LMA Size: 4.0 Number of attempts: 1 Airway Equipment and Method: Bite block Placement Confirmation: positive ETCO2 Tube secured with: Tape Dental Injury: Teeth and Oropharynx as per pre-operative assessment

## 2019-01-23 NOTE — Anesthesia Postprocedure Evaluation (Signed)
Anesthesia Post Note  Patient: Alyssa Jackson  Procedure(s) Performed: CYSTOSCOPY/URETEROSCOPY/HOLMIUM LASER/STENT PLACEMENT (Right )     Patient location during evaluation: PACU Anesthesia Type: General Level of consciousness: awake and alert Pain management: pain level controlled Vital Signs Assessment: post-procedure vital signs reviewed and stable Respiratory status: spontaneous breathing, nonlabored ventilation, respiratory function stable and patient connected to nasal cannula oxygen Cardiovascular status: blood pressure returned to baseline and stable Postop Assessment: no apparent nausea or vomiting Anesthetic complications: no    Last Vitals:  Vitals:   01/23/19 1215 01/23/19 1409  BP: 122/74 (!) 141/67  Pulse: 71 74  Resp: (!) 9 16  Temp:  (!) 36.4 C  SpO2: 100% 100%    Last Pain:  Vitals:   01/23/19 1409  TempSrc:   PainSc: 1                  Ryan P Ellender

## 2019-01-23 NOTE — Anesthesia Preprocedure Evaluation (Addendum)
Anesthesia Evaluation  Patient identified by MRN, date of birth, ID band Patient awake    Reviewed: Allergy & Precautions, Patient's Chart, lab work & pertinent test results  History of Anesthesia Complications (+) PONV and history of anesthetic complications  Airway Mallampati: II  TM Distance: >3 FB Neck ROM: Full    Dental no notable dental hx.    Pulmonary asthma ,    Pulmonary exam normal breath sounds clear to auscultation       Cardiovascular hypertension, Pt. on medications Normal cardiovascular exam Rhythm:Regular Rate:Normal  ECG: NSR, LVH, rate 83   Neuro/Psych PSYCHIATRIC DISORDERS Anxiety negative neurological ROS     GI/Hepatic negative GI ROS, Neg liver ROS,   Endo/Other  diabetes, Oral Hypoglycemic AgentsHypothyroidism Breast CA s/p chemo and rads  Renal/GU negative Renal ROS     Musculoskeletal negative musculoskeletal ROS (+)   Abdominal   Peds  Hematology HLD   Anesthesia Other Findings RIGHT RENAL STONE  Reproductive/Obstetrics                            Anesthesia Physical Anesthesia Plan  ASA: III  Anesthesia Plan: General   Post-op Pain Management:    Induction: Intravenous  PONV Risk Score and Plan: 4 or greater and Ondansetron, Dexamethasone, Treatment may vary due to age or medical condition and Midazolam  Airway Management Planned: LMA  Additional Equipment:   Intra-op Plan:   Post-operative Plan: Extubation in OR  Informed Consent: I have reviewed the patients History and Physical, chart, labs and discussed the procedure including the risks, benefits and alternatives for the proposed anesthesia with the patient or authorized representative who has indicated his/her understanding and acceptance.     Dental advisory given  Plan Discussed with: CRNA  Anesthesia Plan Comments:         Anesthesia Quick Evaluation

## 2019-01-23 NOTE — Op Note (Signed)
Operative Note  Preoperative diagnosis:  1.  1.8 cm right renal stone  Postoperative diagnosis: 1.  1.8 cm right renal stone  Procedure(s): 1.  Cystoscopy with right ureteroscopy, holmium laser lithotripsy and right JJ stent placement  Surgeon: Ellison Hughs, MD  Assistants:  None  Anesthesia:  General  Complications:  None  EBL: Less than 5 mL  Specimens: 1.  None  Drains/Catheters: 1.  Right 6 French, 24 cm JJ stent without tether  Intraoperative findings:   1. Right retrograde pyelogram revealed no filling defects along the entire length of the right ureter.  There was a filling defect within the right renal pelvis, consistent with her known right renal stone.  There were no other filling defects seen throughout the remainder of the right renal pelvis and its associated calyces. 2. Well fragmented 1.8 cm right renal stone  Indication:  Alyssa Jackson is a 73 y.o. female with long history of kidney stones.  She has been on active surveillance for due to a nonobstructing right renal stone that has progressively grown over the past several years.  She has been consented for the above procedures, voices understanding and wishes to proceed.  Description of procedure:  After informed consent was obtained, the patient was brought to the operating room and general LMA anesthesia was administered. The patient was then placed in the dorsolithotomy position and prepped and draped in the usual sterile fashion. A timeout was performed. A 23 French rigid cystoscope was then inserted into the urethral meatus and advanced into the bladder under direct vision. A complete bladder survey revealed no intravesical pathology.  A 5 French ureteral catheter was then inserted into the right ureteral orifice and a retrograde pyelogram was obtained, with the findings listed above.  A Glidewire was then used to intubate the lumen of the ureteral catheter and was advanced up to the right renal  pelvis, under fluoroscopic guidance.  The catheter was then removed, leaving the wire in place.  A dual-lumen catheter was then advanced over the Glidewire and into position within the right ureter.  An additional sensor wire was then placed through the dual-lumen catheter and into the right renal pelvis, under fluoroscopic guidance.  The dual-lumen catheter was then removed, leaving the wires in place.  A 14 French ureteral access sheath was then advanced over the sensor wire and into position within the proximal aspects of the right ureter.  A flexible ureteroscope was then advanced through the lumen of the access sheath and up to the right renal pelvis, and immediately identifying her large right renal stone.  A 200 m holmium laser was then used to dust the stone.  The flexible ureteroscope and access sheath were then removed under direct vision, revealing no evidence of ureteral trauma or large stone fragments within the lumen of the ureter.    A 6 French, 24 cm JJ stent was then placed over the working wire and into position within the right collecting system, confirming placement via fluoroscopy.  The patient's bladder was drained.  She tolerated the procedure well and was transferred to the postanesthesia in stable condition.  Plan: Follow-up in 1 week for office cystoscopy and stent removal.

## 2019-01-24 ENCOUNTER — Encounter (HOSPITAL_BASED_OUTPATIENT_CLINIC_OR_DEPARTMENT_OTHER): Payer: Self-pay | Admitting: Urology

## 2019-02-01 ENCOUNTER — Inpatient Hospital Stay (HOSPITAL_COMMUNITY)
Admission: EM | Admit: 2019-02-01 | Discharge: 2019-02-03 | DRG: 693 | Disposition: A | Discharge status: Home / Self Care | Payer: Medicare Other | Attending: Urology | Admitting: Urology

## 2019-02-01 ENCOUNTER — Other Ambulatory Visit: Payer: Self-pay

## 2019-02-01 ENCOUNTER — Emergency Department (HOSPITAL_COMMUNITY): Payer: Medicare Other

## 2019-02-01 ENCOUNTER — Observation Stay (HOSPITAL_COMMUNITY): Payer: Medicare Other

## 2019-02-01 ENCOUNTER — Encounter (HOSPITAL_COMMUNITY): Payer: Self-pay

## 2019-02-01 DIAGNOSIS — Z87440 Personal history of urinary (tract) infections: Secondary | ICD-10-CM

## 2019-02-01 DIAGNOSIS — K661 Hemoperitoneum: Secondary | ICD-10-CM | POA: Diagnosis present

## 2019-02-01 DIAGNOSIS — R339 Retention of urine, unspecified: Secondary | ICD-10-CM | POA: Diagnosis not present

## 2019-02-01 DIAGNOSIS — E785 Hyperlipidemia, unspecified: Secondary | ICD-10-CM | POA: Diagnosis present

## 2019-02-01 DIAGNOSIS — Z880 Allergy status to penicillin: Secondary | ICD-10-CM

## 2019-02-01 DIAGNOSIS — Z7984 Long term (current) use of oral hypoglycemic drugs: Secondary | ICD-10-CM

## 2019-02-01 DIAGNOSIS — Z9221 Personal history of antineoplastic chemotherapy: Secondary | ICD-10-CM

## 2019-02-01 DIAGNOSIS — K219 Gastro-esophageal reflux disease without esophagitis: Secondary | ICD-10-CM | POA: Diagnosis present

## 2019-02-01 DIAGNOSIS — Z20828 Contact with and (suspected) exposure to other viral communicable diseases: Secondary | ICD-10-CM | POA: Diagnosis present

## 2019-02-01 DIAGNOSIS — Z90722 Acquired absence of ovaries, bilateral: Secondary | ICD-10-CM

## 2019-02-01 DIAGNOSIS — Z885 Allergy status to narcotic agent status: Secondary | ICD-10-CM

## 2019-02-01 DIAGNOSIS — E119 Type 2 diabetes mellitus without complications: Secondary | ICD-10-CM | POA: Diagnosis present

## 2019-02-01 DIAGNOSIS — G8929 Other chronic pain: Secondary | ICD-10-CM | POA: Diagnosis present

## 2019-02-01 DIAGNOSIS — D62 Acute posthemorrhagic anemia: Secondary | ICD-10-CM | POA: Diagnosis present

## 2019-02-01 DIAGNOSIS — Z79890 Hormone replacement therapy (postmenopausal): Secondary | ICD-10-CM

## 2019-02-01 DIAGNOSIS — N132 Hydronephrosis with renal and ureteral calculous obstruction: Secondary | ICD-10-CM | POA: Diagnosis not present

## 2019-02-01 DIAGNOSIS — Z961 Presence of intraocular lens: Secondary | ICD-10-CM | POA: Diagnosis present

## 2019-02-01 DIAGNOSIS — Z882 Allergy status to sulfonamides status: Secondary | ICD-10-CM

## 2019-02-01 DIAGNOSIS — Z9841 Cataract extraction status, right eye: Secondary | ICD-10-CM

## 2019-02-01 DIAGNOSIS — F419 Anxiety disorder, unspecified: Secondary | ICD-10-CM | POA: Diagnosis present

## 2019-02-01 DIAGNOSIS — Z9012 Acquired absence of left breast and nipple: Secondary | ICD-10-CM

## 2019-02-01 DIAGNOSIS — Z90710 Acquired absence of both cervix and uterus: Secondary | ICD-10-CM

## 2019-02-01 DIAGNOSIS — N200 Calculus of kidney: Secondary | ICD-10-CM

## 2019-02-01 DIAGNOSIS — I10 Essential (primary) hypertension: Secondary | ICD-10-CM | POA: Diagnosis present

## 2019-02-01 DIAGNOSIS — J45909 Unspecified asthma, uncomplicated: Secondary | ICD-10-CM | POA: Diagnosis present

## 2019-02-01 DIAGNOSIS — Z79899 Other long term (current) drug therapy: Secondary | ICD-10-CM

## 2019-02-01 DIAGNOSIS — M25512 Pain in left shoulder: Secondary | ICD-10-CM | POA: Diagnosis present

## 2019-02-01 DIAGNOSIS — Z923 Personal history of irradiation: Secondary | ICD-10-CM

## 2019-02-01 DIAGNOSIS — Z833 Family history of diabetes mellitus: Secondary | ICD-10-CM

## 2019-02-01 DIAGNOSIS — Z91040 Latex allergy status: Secondary | ICD-10-CM

## 2019-02-01 DIAGNOSIS — K5790 Diverticulosis of intestine, part unspecified, without perforation or abscess without bleeding: Secondary | ICD-10-CM | POA: Diagnosis present

## 2019-02-01 DIAGNOSIS — R319 Hematuria, unspecified: Secondary | ICD-10-CM | POA: Diagnosis present

## 2019-02-01 DIAGNOSIS — Z9842 Cataract extraction status, left eye: Secondary | ICD-10-CM

## 2019-02-01 DIAGNOSIS — E039 Hypothyroidism, unspecified: Secondary | ICD-10-CM | POA: Diagnosis present

## 2019-02-01 DIAGNOSIS — Z853 Personal history of malignant neoplasm of breast: Secondary | ICD-10-CM

## 2019-02-01 DIAGNOSIS — Z888 Allergy status to other drugs, medicaments and biological substances status: Secondary | ICD-10-CM

## 2019-02-01 LAB — I-STAT CHEM 8, ED
BUN: 16 mg/dL (ref 8–23)
Calcium, Ion: 1.13 mmol/L — ABNORMAL LOW (ref 1.15–1.40)
Chloride: 95 mmol/L — ABNORMAL LOW (ref 98–111)
Creatinine, Ser: 1.2 mg/dL — ABNORMAL HIGH (ref 0.44–1.00)
Glucose, Bld: 87 mg/dL (ref 70–99)
HCT: 34 % — ABNORMAL LOW (ref 36.0–46.0)
Hemoglobin: 11.6 g/dL — ABNORMAL LOW (ref 12.0–15.0)
Potassium: 4.4 mmol/L (ref 3.5–5.1)
Sodium: 129 mmol/L — ABNORMAL LOW (ref 135–145)
TCO2: 24 mmol/L (ref 22–32)

## 2019-02-01 LAB — URINALYSIS, ROUTINE W REFLEX MICROSCOPIC
Bilirubin Urine: NEGATIVE
Glucose, UA: NEGATIVE mg/dL
Ketones, ur: NEGATIVE mg/dL
Nitrite: NEGATIVE
Protein, ur: NEGATIVE mg/dL
Specific Gravity, Urine: 1.006 (ref 1.005–1.030)
pH: 7 (ref 5.0–8.0)

## 2019-02-01 LAB — CBC
HCT: 37.3 % (ref 36.0–46.0)
Hemoglobin: 12.7 g/dL (ref 12.0–15.0)
MCH: 27.7 pg (ref 26.0–34.0)
MCHC: 34 g/dL (ref 30.0–36.0)
MCV: 81.3 fL (ref 80.0–100.0)
Platelets: 254 10*3/uL (ref 150–400)
RBC: 4.59 MIL/uL (ref 3.87–5.11)
RDW: 12.8 % (ref 11.5–15.5)
WBC: 9.3 10*3/uL (ref 4.0–10.5)
nRBC: 0 % (ref 0.0–0.2)

## 2019-02-01 LAB — CBG MONITORING, ED: Glucose-Capillary: 106 mg/dL — ABNORMAL HIGH (ref 70–99)

## 2019-02-01 LAB — LIPASE, BLOOD: Lipase: 14 U/L (ref 11–51)

## 2019-02-01 LAB — GLUCOSE, CAPILLARY: Glucose-Capillary: 150 mg/dL — ABNORMAL HIGH (ref 70–99)

## 2019-02-01 MED ORDER — CIPROFLOXACIN HCL 500 MG PO TABS
500.0000 mg | ORAL_TABLET | Freq: Two times a day (BID) | ORAL | Status: DC
  Administered 2019-02-02 – 2019-02-03 (×3): 500 mg via ORAL
  Filled 2019-02-01 (×3): qty 1

## 2019-02-01 MED ORDER — INSULIN ASPART 100 UNIT/ML ~~LOC~~ SOLN
0.0000 [IU] | Freq: Three times a day (TID) | SUBCUTANEOUS | Status: DC
  Administered 2019-02-02: 2 [IU] via SUBCUTANEOUS
  Filled 2019-02-01: qty 0.15

## 2019-02-01 MED ORDER — ONDANSETRON HCL 4 MG/2ML IJ SOLN
4.0000 mg | Freq: Once | INTRAMUSCULAR | Status: AC
  Administered 2019-02-01: 4 mg via INTRAVENOUS
  Filled 2019-02-01: qty 2

## 2019-02-01 MED ORDER — IOHEXOL 300 MG/ML  SOLN
100.0000 mL | Freq: Once | INTRAMUSCULAR | Status: AC | PRN
  Administered 2019-02-01: 100 mL via INTRAVENOUS

## 2019-02-01 MED ORDER — ACETAMINOPHEN 325 MG PO TABS: 650.0000 mg | ORAL_TABLET | ORAL | Status: DC | PRN

## 2019-02-01 MED ORDER — TERAZOSIN HCL 2 MG PO CAPS
2.0000 mg | ORAL_CAPSULE | Freq: Every day | ORAL | Status: DC
  Administered 2019-02-02: 2 mg via ORAL
  Filled 2019-02-01 (×2): qty 1

## 2019-02-01 MED ORDER — SILODOSIN 4 MG PO CAPS
8.0000 mg | ORAL_CAPSULE | Freq: Every day | ORAL | Status: DC
  Administered 2019-02-03: 09:00:00 8 mg via ORAL
  Filled 2019-02-01 (×2): qty 2

## 2019-02-01 MED ORDER — DIPHENHYDRAMINE HCL 50 MG/ML IJ SOLN: 12.5000 mg | Freq: Four times a day (QID) | INTRAMUSCULAR | Status: DC | PRN

## 2019-02-01 MED ORDER — LEVOTHYROXINE SODIUM 100 MCG PO TABS
100.0000 ug | ORAL_TABLET | Freq: Every day | ORAL | Status: DC
  Administered 2019-02-02 – 2019-02-03 (×2): 100 ug via ORAL
  Filled 2019-02-01 (×2): qty 1

## 2019-02-01 MED ORDER — PRAVASTATIN SODIUM 20 MG PO TABS
40.0000 mg | ORAL_TABLET | Freq: Every day | ORAL | Status: DC
  Administered 2019-02-02 – 2019-02-03 (×2): 40 mg via ORAL
  Filled 2019-02-01 (×3): qty 2

## 2019-02-01 MED ORDER — ALBUTEROL SULFATE (2.5 MG/3ML) 0.083% IN NEBU: 3.0000 mL | INHALATION_SOLUTION | RESPIRATORY_TRACT | Status: DC | PRN

## 2019-02-01 MED ORDER — SODIUM CHLORIDE 0.9 % IV SOLN
INTRAVENOUS | Status: DC
  Administered 2019-02-01: 22:00:00 via INTRAVENOUS

## 2019-02-01 MED ORDER — MORPHINE SULFATE (PF) 2 MG/ML IV SOLN: 2.0000 mg | INTRAVENOUS | Status: DC | PRN

## 2019-02-01 MED ORDER — SENNA 8.6 MG PO TABS
1.0000 | ORAL_TABLET | Freq: Two times a day (BID) | ORAL | Status: DC
  Administered 2019-02-02 – 2019-02-03 (×3): 8.6 mg via ORAL
  Filled 2019-02-01 (×3): qty 1

## 2019-02-01 MED ORDER — IOHEXOL 300 MG/ML  SOLN
30.0000 mL | Freq: Once | INTRAMUSCULAR | Status: AC | PRN
  Administered 2019-02-01: 30 mL

## 2019-02-01 MED ORDER — DIPHENHYDRAMINE HCL 12.5 MG/5ML PO ELIX: 12.5000 mg | ORAL_SOLUTION | Freq: Four times a day (QID) | ORAL | Status: DC | PRN

## 2019-02-01 MED ORDER — BELLADONNA ALKALOIDS-OPIUM 16.2-60 MG RE SUPP: 1.0000 | Freq: Four times a day (QID) | RECTAL | Status: DC | PRN

## 2019-02-01 MED ORDER — SODIUM CHLORIDE 0.9 % IV BOLUS
1000.0000 mL | Freq: Once | INTRAVENOUS | Status: AC
  Administered 2019-02-01: 1000 mL via INTRAVENOUS

## 2019-02-01 MED ORDER — OXYBUTYNIN CHLORIDE 5 MG PO TABS: 5.0000 mg | ORAL_TABLET | Freq: Three times a day (TID) | ORAL | Status: DC | PRN

## 2019-02-01 MED ORDER — LISINOPRIL 5 MG PO TABS
5.0000 mg | ORAL_TABLET | Freq: Every day | ORAL | Status: DC
  Administered 2019-02-02 – 2019-02-03 (×2): 5 mg via ORAL
  Filled 2019-02-01 (×2): qty 1

## 2019-02-01 MED ORDER — NON FORMULARY: 8.0000 mg | Freq: Every day | Status: DC

## 2019-02-01 MED ORDER — POLYVINYL ALCOHOL 1.4 % OP SOLN: 1.0000 [drp] | OPHTHALMIC | Status: DC | PRN

## 2019-02-01 MED ORDER — SODIUM CHLORIDE 0.9% FLUSH
3.0000 mL | Freq: Once | INTRAVENOUS | Status: AC
  Administered 2019-02-01: 3 mL via INTRAVENOUS

## 2019-02-01 MED ORDER — ONDANSETRON HCL 4 MG/2ML IJ SOLN
4.0000 mg | INTRAMUSCULAR | Status: DC | PRN
  Administered 2019-02-01: 4 mg via INTRAVENOUS
  Filled 2019-02-01: qty 2

## 2019-02-01 MED ORDER — FAMOTIDINE 20 MG PO TABS
20.0000 mg | ORAL_TABLET | Freq: Every day | ORAL | Status: DC | PRN
  Administered 2019-02-02: 20 mg via ORAL
  Filled 2019-02-01: qty 1

## 2019-02-01 MED ORDER — INSULIN ASPART 100 UNIT/ML ~~LOC~~ SOLN
0.0000 [IU] | Freq: Every day | SUBCUTANEOUS | Status: DC
  Filled 2019-02-01: qty 0.05

## 2019-02-01 MED ORDER — TRAMADOL HCL 50 MG PO TABS: 50.0000 mg | ORAL_TABLET | Freq: Four times a day (QID) | ORAL | Status: DC | PRN

## 2019-02-01 MED ORDER — MORPHINE SULFATE (PF) 4 MG/ML IV SOLN
4.0000 mg | Freq: Once | INTRAVENOUS | Status: AC
  Administered 2019-02-01: 4 mg via INTRAVENOUS
  Filled 2019-02-01: qty 1

## 2019-02-01 MED ORDER — DOCUSATE SODIUM 100 MG PO CAPS
100.0000 mg | ORAL_CAPSULE | Freq: Two times a day (BID) | ORAL | Status: DC
  Administered 2019-02-02 – 2019-02-03 (×3): 100 mg via ORAL
  Filled 2019-02-01 (×3): qty 1

## 2019-02-01 NOTE — ED Notes (Signed)
Lab phleb notify of lab collection in room 6.

## 2019-02-01 NOTE — ED Triage Notes (Signed)
Pt presents with c/o lower abdominal pain. Pt reports she had "kidney stone surgery" on 11/18 and has had the pain since then and has also since then had the stint taken out. Pt reports she went to her PCP today and was told to come here for a CT scan. Rigid lower abdomen.

## 2019-02-01 NOTE — ED Provider Notes (Signed)
[facility] DEPT Provider Note   CSN: [id number] Arrival date & time: 02/01/19  1043     History   Chief Complaint Chief Complaint  Patient presents with  . Abdominal Pain    HPI Alyssa Jackson is a 74 y.o. female.     HPI  Pt is a 73 y/o female with a h/o asthma, diverticulitis, GERD, left breast CA, HTN, HLD, DM, frequent UTIs, nephrolithiasis, who presents to the ED today for eval of bilateral lower abdominal pain.  Pain worse the left lower quadrant.  Currently rates pain 9/10.  Pain is constant in nature.  It is worse when laying in certain positions.  She also reports associated abdominal distention and feels like she is having urinary retention.  She has had some intermittent nausea.  She had vomiting earlier this week which has since resolved.  She is also had some looser stools.  Denies any bloody stools.  Denies fever.  Has had some dysuria for the last few days.  Reviewed records, pt had cystoscopy, ureteroscopy w stent placement 11/18 with Dr. Lovena Neighbours.  Patient states that 2 days ago she had stent removed in the office by the urologist.  Since then she states she has had some dysuria and feels like she has not been able to empty her bladder fully.  She is not currently on any antibiotics.  Past Medical History:  Diagnosis Date  . Anxiety   . Asthma     per pt mild, inhaler as needed  . Chronic left shoulder pain    occasional pain  . DDD (degenerative disc disease), cervical    per pt with C4--5 spur  . Environmental and seasonal allergies   . Family history of adverse reaction to anesthesia    per pt sister died intraoperatively at age of 67, 91, during T&A surgery "couldn't get her breathing, probably the ether used"  . History of diverticulitis of colon   . History of gastroesophageal reflux (GERD)    01-22-2019 per pt no issues in over a year ago  . History of left breast cancer oncologist--- dr Jana Hakim---  pt released 2017  (last office note in epic)   first dx 02/ 2004-- IDC , Stage I, Grade 2, ER negative--- (SWOG protocol [phone]) completed chemo 04/ 2004, comleted radiation 02-12-2003, and completed arimidex theray;   recurrance of same breast but second primary site  03/ 2012 left breast , Stage IIA, IDC, Grade 3-- completed chemo 01-25-2011 and hercepton 08-30-2011  . History of recurrent UTIs   . History of seizures    per pt age 60 , had 2 seizures 6 months apart, on meds until 1992 approx. , no seizures since (per pt probable cause per scan due to head injury age 22 due to [place])  . History of tachycardia    pt followed by dr Antony Madura while receiving  hercepton for breast cancer due to pt tacrchycardia and ef 45-50%, followed until lov in epc 09-01-2011, per echo ef 50-55%, pt was released   . Hyperlipidemia   . Hypertension   . Hypothyroidism    followed by pcp  . Neutropenia    [facility]  . Personal history of chemotherapy    breast cancer  completed 09/ 2004;  and for recurrance completed 01-25-2011 and completed hercepton 08-30-2011  . Personal history of radiation therapy    left breast completed 02-12-2003  . PONV (postoperative nausea and vomiting)    and slow to wake  .  Renal calculus, right   . Type 2 diabetes mellitus ([place])    followed by pcp--- per pt check's surgar's twice weekly ,  fasting surger-- 95-120  . Urgency of urination   . Wears glasses     Patient Active Problem List   Diagnosis Date Noted  . Anxiety as acute reaction to exceptional stress 07/20/2014  . Vitamin D deficiency 08/20/2012  . Tachycardia 11/22/2010  . Breast cancer, left breast ([place]) 10/14/2010    Past Surgical History:  Procedure Laterality Date  . CATARACT EXTRACTION W/ INTRAOCULAR LENS  IMPLANT, BILATERAL  2013  approx.  . CYSTOSCOPY/URETEROSCOPY/HOLMIUM LASER/STENT PLACEMENT Right 01/23/2019   Procedure: CYSTOSCOPY/URETEROSCOPY/HOLMIUM LASER/STENT PLACEMENT;  Surgeon: Ceasar Mons, MD;   Location: [facility];  Service: Urology;  Laterality: Right;  . MASTECTOMY WITH AXILLARY LYMPH NODE DISSECTION Left 08/04/2010   @[facility]    and PAC insertion  . PARTIAL MASTECTOMY WITH AXILLARY SENTINEL LYMPH NODE BIOPSY Left 04-24-2002  @[facility]   . PLACEMENT DRAIN LEFT CHEST WALL SEROMA CAVITY  09/06/2010  . PORT-A-CATH REMOVAL  10/24/2011   Procedure: MINOR REMOVAL PORT-A-CATH;  Surgeon: Merrie Roof, MD;  Location: [facility];  Service: General;  Laterality: Right;  . RETROPUBIC SLING  03-22-2010   dr grapey  @[facility]    Duane Lope suburetheral   . TONSILLECTOMY  child  . VAGINAL HYSTERECTOMY  02-19-2002  @[facility]    W  BSO     OB History   No obstetric history on file.      Home Medications    Prior to Admission medications   Medication Sig Start Date End Date Taking? Authorizing Provider  acetaminophen (TYLENOL) 650 MG CR tablet Take 650-1,300 mg by mouth every 8 (eight) hours as needed for pain.   Yes [provider]  albuterol (PROVENTIL HFA;VENTOLIN HFA) 108 (90 BASE) MCG/ACT inhaler Inhale 2 puffs into the lungs as needed for wheezing.   Yes [provider]  aspirin 81 MG tablet Take 81 mg by mouth daily.    Yes [provider]  Cholecalciferol (VITAMIN D3) 125 MCG (5000 UT) CAPS Take 5,000 Units by mouth daily.   Yes [provider]  famotidine (PEPCID) 20 MG tablet Take 20 mg by mouth daily as needed for heartburn or indigestion.   Yes [provider]  ketorolac (TORADOL) 10 MG tablet Take 10 mg by mouth every 8 (eight) hours as needed for moderate pain.   Yes [provider]  levothyroxine (SYNTHROID) 100 MCG tablet Take 100 mcg by mouth daily before breakfast.   Yes [provider]  metFORMIN (GLUCOPHAGE) 1000 MG tablet Take 1,000 mg by mouth 2 (two) times daily.    Yes [provider]  ondansetron (ZOFRAN) 4 MG tablet Take 1 tablet (4 mg total) by mouth daily as needed for nausea or  vomiting. 01/23/19 01/23/20 Yes Ceasar Mons, MD  oxybutynin (DITROPAN) 5 MG tablet Take 1 tablet (5 mg total) by mouth every 8 (eight) hours as needed for bladder spasms. 01/23/19  Yes Ceasar Mons, MD  Phenazopyridine HCl (AZO TABS PO) Take 2 tablets by mouth 3 (three) times daily.   Yes [provider]  polyvinyl alcohol (LIQUIFILM TEARS) 1.4 % ophthalmic solution Place 1 drop into both eyes as needed for dry eyes.   Yes [provider]  pravastatin (PRAVACHOL) 40 MG tablet Take 40 mg by mouth daily.    Yes [provider]  quinapril (ACCUPRIL) 5 MG tablet Take 5 mg by  mouth daily.    Yes [provider]  HYDROcodone-acetaminophen (NORCO) 5-325 MG tablet Take 1 tablet by mouth every 4 (four) hours as needed for moderate pain. Patient not taking: Reported on 02/01/2019 01/23/19   Ceasar Mons, MD  phenazopyridine (PYRIDIUM) 200 MG tablet Take 1 tablet (200 mg total) by mouth 3 (three) times daily as needed (for pain with urination). 01/23/19 01/23/20  Ceasar Mons, MD    Family History Family History  Problem Relation Age of Onset  . Cancer Father        soft tissue of the jaw  . Diabetes Father     Social History Social History   Tobacco Use  . Smoking status: Never Smoker  . Smokeless tobacco: Never Used  Substance Use Topics  . Alcohol use: No  . Drug use: Never     Allergies   Carboplatin, Latex, Penicillins, Sulfa drugs cross reactors, Adhesive [tape], Hydrocodone, Iodine, and Sulfa antibiotics   Review of Systems Review of Systems  Constitutional: Negative for chills and fever.  HENT: Negative for ear pain and sore throat.   Eyes: Negative for visual disturbance.  Respiratory: Negative for cough and shortness of breath.   Cardiovascular: Negative for chest pain.  Gastrointestinal: Positive for abdominal distention, abdominal pain, diarrhea, nausea (resolved) and vomiting  (resolved). Negative for blood in stool.  Genitourinary: Positive for difficulty urinating and dysuria. Negative for decreased urine volume and hematuria.  Musculoskeletal: Negative for back pain.  Skin: Negative for rash.  Neurological: Negative for headaches.  All other systems reviewed and are negative.    Physical Exam Updated Vital Signs BP (!) 142/70 (BP Location: Right Arm)   Pulse 79   Temp 98 F (36.7 C) (Oral)   Resp 16   Ht 5\' 4"  (1.626 m)   Wt 63.5 kg   SpO2 97%   BMI 24.03 kg/m   Physical Exam Vitals signs and nursing note reviewed.  Constitutional:      General: She is not in acute distress.    Appearance: She is well-developed.  HENT:     Head: Normocephalic and atraumatic.  Eyes:     Conjunctiva/sclera: Conjunctivae normal.  Neck:     Musculoskeletal: Neck supple.  Cardiovascular:     Rate and Rhythm: Regular rhythm. Tachycardia present.     Heart sounds: No murmur.  Pulmonary:     Effort: Pulmonary effort is normal. No respiratory distress.     Breath sounds: Normal breath sounds. No wheezing, rhonchi or rales.  Abdominal:     General: Bowel sounds are increased. There is distension.     Palpations: Abdomen is soft.     Tenderness: There is abdominal tenderness in the right lower quadrant, suprapubic area and left lower quadrant. There is guarding. There is no rebound.  Skin:    General: Skin is warm and dry.  Neurological:     Mental Status: She is alert.      ED Treatments / Results  Labs (all labs ordered are listed, but only abnormal results are displayed) Labs Reviewed  URINALYSIS, ROUTINE W REFLEX MICROSCOPIC - Abnormal; Notable for the following components:      Result Value   Hgb urine dipstick LARGE (*)    Leukocytes,Ua MODERATE (*)    Bacteria, UA RARE (*)    All other components within normal limits  I-STAT CHEM 8, ED - Abnormal; Notable for the following components:   Sodium 129 (*)    Chloride 95 (*)  Creatinine, Ser 1.20  (*)    Calcium, Ion 1.13 (*)    Hemoglobin 11.6 (*)    HCT 34.0 (*)    All other components within normal limits  URINE CULTURE  CBC  LIPASE, BLOOD    EKG None  Radiology Ct Abdomen Pelvis W Contrast  Result Date: 02/01/2019 CLINICAL DATA:  Lower abdominal pain, recent ureteral stent EXAM: CT ABDOMEN AND PELVIS WITH CONTRAST TECHNIQUE: Multidetector CT imaging of the abdomen and pelvis was performed using the standard protocol following bolus administration of intravenous contrast. CONTRAST:  139mL OMNIPAQUE IOHEXOL 300 MG/ML  SOLN COMPARISON:  CT abdomen pelvis, 06/29/2015 FINDINGS: Lower chest: No acute abnormality. Hepatobiliary: Benign, peripherally enhancing hemangiomata of the right lobe of the liver. No gallstones, gallbladder wall thickening, or biliary dilatation. Pancreas: Unremarkable. No pancreatic ductal dilatation or surrounding inflammatory changes. Spleen: Normal in size without significant abnormality. Adrenals/Urinary Tract: Adrenal glands are unremarkable. There is a 6 mm calculus at the right ureterovesicular junction with mild right hydronephrosis and hydroureter with hyperenhancement of the right ureter (series 3, image 71, 32). There is an elongated extraperitoneal hemorrhage about the left pelvic sidewall and about the bladder dome, which measures at least 9.0 x 5.6 x 6.3 cm (series 3, image 67, series 6, image 100, series 5, image 29). Foley catheter in the relatively decompressed urinary bladder. Stomach/Bowel: Stomach is within normal limits. Appendix appears normal. No evidence of bowel wall thickening, distention, or inflammatory changes. Severe descending and sigmoid diverticulosis. Vascular/Lymphatic: No significant vascular findings are present. No enlarged abdominal or pelvic lymph nodes. Reproductive: No mass or other significant abnormality. Other: No abdominal wall hernia or abnormality. No abdominopelvic ascites. Musculoskeletal: No acute or significant osseous  findings. IMPRESSION: 1. There is a 6 mm calculus at the right ureterovesicular junction with mild right hydronephrosis and hydroureter with hyperenhancement of the right ureter. 2. There is an elongated extraperitoneal hemorrhage about the left pelvic sidewall and about the bladder dome, which measures at least 9.0 x 5.6 x 6.3 cm, presumably related to recent stent placement or removal. 3. Diverticulosis without evidence of acute diverticulitis. These results were called by telephone at the time of interpretation on 02/01/2019 at 4:55 pm to provider [facility] , who verbally acknowledged these results. Electronically Signed   By: Eddie Candle M.D.   On: 02/01/2019 16:59    Procedures Procedures (including critical care time)  Medications Ordered in ED Medications  sodium chloride 0.9 % bolus 1,000 mL (has no administration in time range)  sodium chloride flush (NS) 0.9 % injection 3 mL (3 mLs Intravenous Given 02/01/19 1332)  morphine 4 MG/ML injection 4 mg (4 mg Intravenous Given 02/01/19 1331)  ondansetron (ZOFRAN) injection 4 mg (4 mg Intravenous Given 02/01/19 1331)  sodium chloride 0.9 % bolus 1,000 mL (0 mLs Intravenous Stopped 02/01/19 1600)  iohexol (OMNIPAQUE) 300 MG/ML solution 100 mL (100 mLs Intravenous Contrast Given 02/01/19 1614)     Initial Impression / Assessment and Plan / ED Course  I have reviewed the triage vital signs and the nursing notes.  Pertinent labs & imaging results that were available during my care of the patient were reviewed by me and considered in my medical decision making (see chart for details).   Final Clinical Impressions(s) / ED Diagnoses   Final diagnoses:  Peritoneal hemorrhage  Urinary retention   73 year old female presenting for evaluation of lower abdominal pain.  Had recent cystoscopy and ureteroscopy with stent placement.  Stent was removed 2 days  ago.  Since then she has had some dysuria and felt like she is had urinary retention.   Also with abdominal distention and pain.  CBC without leukocytosis, or anemia istat chem 8 with Cr of 1.2, slightly worse from prior Lipase wnl UA with hematuria, moderate leukocytes, 11-20 RBC, 11-20 WBC, and rare bacteria. Culture sent.  Bladder scan completed, with 800-900 cc in the bladder. Foley placed due to urinary retention and pt had 1.5L output  CT abd/pelvis with 6 mm calculus at the right ureterovesicular junction with mild right hydronephrosis and hydroureter with hyperenhancement of the right ureter. There is an elongated extraperitoneal hemorrhage about the left pelvic sidewall and about the bladder dome, which measures at least 9.0 x 5.6 x 6.3 cm, presumably related to recent stent placement or removal. Diverticulosis without evidence of acute diverticulitis.  5:26 PM CONSULT with Dr. Gloriann Loan with urology who will review the imaging and call me back.  6:16 PM Dr. Gloriann Loan with urology who evaluated the pt at bedside and will admit her to the urology service.   ED Discharge Orders    None       Rodney Booze, [place] 02/01/19 1817    Pattricia Boss, MD 02/02/19 1453

## 2019-02-01 NOTE — ED Notes (Signed)
ED TO INPATIENT HANDOFF REPORT  ED Nurse Name and Phone #: jon wled   S Name/Age/Gender Alyssa Jackson 73 y.o. female Room/Bed: WA06/WA06  Code Status   Code Status: Not on file  Home/SNF/Other Home Patient oriented to: self, place, time and situation Is this baseline? Yes   Triage Complete: Triage complete  Chief Complaint Sent by doctor CT SCAN  Triage Note Pt presents with c/o lower abdominal pain. Pt reports she had "kidney stone surgery" on 11/18 and has had the pain since then and has also since then had the stint taken out. Pt reports she went to her PCP today and was told to come here for a CT scan. Rigid lower abdomen.   Allergies Allergies  Allergen Reactions  . Carboplatin     Positive skin test documented.  . Latex Other (See Comments)    Break out in blisters  . Penicillins     Since childhood Has patient had a PCN reaction causing immediate rash, facial/tongue/throat swelling, SOB or lightheadedness with hypotension: Unknown Has patient had a PCN reaction causing severe rash involving mucus membranes or skin necrosis: Unknown Has patient had a PCN reaction that required hospitalization Unknown Has patient had a PCN reaction occurring within the last 10 years: No If all of the above answers are "NO", then may proceed with Cephalosporin use.   . Sulfa Drugs Cross Reactors Nausea And Vomiting  . Adhesive [Tape]   . Hydrocodone   . Iodine Other (See Comments)    Gallbladder dye contrast tablets, pt states is not allergic to Iodine Has had other "dye" test with no problems.  . Sulfa Antibiotics Rash    Level of Care/Admitting Diagnosis ED Disposition    ED Disposition Condition [facility] Area: [place] [[id number]]  Level of Care: Med-Surg [16]  Covid Evaluation: Asymptomatic Screening Protocol (No Symptoms)  Diagnosis: Renal calculi [id number]  Admitting Physician: Lucas Mallow [id number]  Attending Physician:  Marton Redwood, III [[id number]]  PT Class (Do Not Modify): Observation [104]  PT Acc Code (Do Not Modify): Observation [10022]       B Medical/Surgery History Past Medical History:  Diagnosis Date  . Anxiety   . Asthma     per pt mild, inhaler as needed  . Chronic left shoulder pain    occasional pain  . DDD (degenerative disc disease), cervical    per pt with C4--5 spur  . Environmental and seasonal allergies   . Family history of adverse reaction to anesthesia    per pt sister died intraoperatively at age of 52, 30, during T&A surgery "couldn't get her breathing, probably the ether used"  . History of diverticulitis of colon   . History of gastroesophageal reflux (GERD)    01-22-2019 per pt no issues in over a year ago  . History of left breast cancer oncologist--- dr Jana Hakim---  pt released 2017 (last office note in epic)   first dx 02/ 2004-- IDC , Stage I, Grade 2, ER negative--- (SWOG protocol [phone]) completed chemo 04/ 2004, comleted radiation 02-12-2003, and completed arimidex theray;   recurrance of same breast but second primary site  03/ 2012 left breast , Stage IIA, IDC, Grade 3-- completed chemo 01-25-2011 and hercepton 08-30-2011  . History of recurrent UTIs   . History of seizures    per pt age 74 , had 2 seizures 6 months apart, on meds until 1992 approx. , no seizures since (per  pt probable cause per scan due to head injury age 75 due to [place])  . History of tachycardia    pt followed by dr Antony Madura while receiving  hercepton for breast cancer due to pt tacrchycardia and ef 45-50%, followed until lov in epc 09-01-2011, per echo ef 50-55%, pt was released   . Hyperlipidemia   . Hypertension   . Hypothyroidism    followed by pcp  . Neutropenia    chroinic  . Personal history of chemotherapy    breast cancer  completed 09/ 2004;  and for recurrance completed 01-25-2011 and completed hercepton 08-30-2011  . Personal history of radiation therapy    left breast  completed 02-12-2003  . PONV (postoperative nausea and vomiting)    and slow to wake  . Renal calculus, right   . Type 2 diabetes mellitus ([place])    followed by pcp--- per pt check's surgar's twice weekly ,  fasting surger-- 95-120  . Urgency of urination   . Wears glasses    Past Surgical History:  Procedure Laterality Date  . CATARACT EXTRACTION W/ INTRAOCULAR LENS  IMPLANT, BILATERAL  2013  approx.  . CYSTOSCOPY/URETEROSCOPY/HOLMIUM LASER/STENT PLACEMENT Right 01/23/2019   Procedure: CYSTOSCOPY/URETEROSCOPY/HOLMIUM LASER/STENT PLACEMENT;  Surgeon: Ceasar Mons, MD;  Location: [facility];  Service: Urology;  Laterality: Right;  . MASTECTOMY WITH AXILLARY LYMPH NODE DISSECTION Left 08/04/2010   @[facility]    and PAC insertion  . PARTIAL MASTECTOMY WITH AXILLARY SENTINEL LYMPH NODE BIOPSY Left 04-24-2002  @[facility]   . PLACEMENT DRAIN LEFT CHEST WALL SEROMA CAVITY  09/06/2010  . PORT-A-CATH REMOVAL  10/24/2011   Procedure: MINOR REMOVAL PORT-A-CATH;  Surgeon: Merrie Roof, MD;  Location: [place];  Service: General;  Laterality: Right;  . RETROPUBIC SLING  03-22-2010   dr grapey  @[facility]    [place]x suburetheral   . TONSILLECTOMY  child  . VAGINAL HYSTERECTOMY  02-19-2002  @[facility]    W  BSO     A IV Location/Drains/Wounds Patient Lines/Drains/Airways Status   Active Line/Drains/Airways    Name:   Placement date:   Placement time:   Site:   Days:   Implanted Port 08/04/10 Right Chest   08/04/10    -    Chest   3103   Peripheral IV 02/01/19 Right Hand   02/01/19    1313    Hand   less than 1   Urethral Catheter Ronney Lion NT+3 Latex 14 Fr.   02/01/19    1152    Latex   less than 1   Ureteral Drain/Stent Right ureter 6 Fr.   01/23/19    1059    Right ureter   9   Incision (Closed) 01/23/19 Perineum   01/23/19    1045     9          Intake/Output Last 24 hours  Intake/Output Summary (Last 24 hours) at 02/01/2019 1921 Last data filed at 02/01/2019  1153 Gross per 24 hour  Intake -  Output 850 ml  Net -850 ml    Labs/Imaging Results for orders placed or performed during the hospital encounter of 02/01/19 (from the past 48 hour(s))  Urinalysis, Routine w reflex microscopic     Status: Abnormal   Collection Time: 02/01/19 11:54 AM  Result Value Ref Range   Color, Urine YELLOW YELLOW   APPearance CLEAR CLEAR   Specific Gravity, Urine 1.006 1.005 - 1.030   pH 7.0 5.0 - 8.0   Glucose, UA  NEGATIVE NEGATIVE mg/dL   Hgb urine dipstick LARGE (A) NEGATIVE   Bilirubin Urine NEGATIVE NEGATIVE   Ketones, ur NEGATIVE NEGATIVE mg/dL   Protein, ur NEGATIVE NEGATIVE mg/dL   Nitrite NEGATIVE NEGATIVE   Leukocytes,Ua MODERATE (A) NEGATIVE   RBC / HPF 11-20 0 - 5 RBC/hpf   WBC, UA 11-20 0 - 5 WBC/hpf   Bacteria, UA RARE (A) NONE SEEN   Squamous Epithelial / LPF 0-5 0 - 5   Mucus PRESENT     Comment: Performed at [facility], [place] [address] [place], [place] [address]  CBC     Status: None   Collection Time: 02/01/19 12:30 PM  Result Value Ref Range   WBC 9.3 4.0 - 10.5 K/uL   RBC 4.59 3.87 - 5.11 MIL/uL   Hemoglobin 12.7 12.0 - 15.0 g/dL   HCT 37.3 36.0 - 46.0 %   MCV 81.3 80.0 - 100.0 fL   MCH 27.7 26.0 - 34.0 pg   MCHC 34.0 30.0 - 36.0 g/dL   RDW 12.8 11.5 - 15.5 %   Platelets 254 150 - 400 K/uL   nRBC 0.0 0.0 - 0.2 %    Comment: Performed at [facility], [place] [address], [place], [place] [address]  Lipase, blood     Status: None   Collection Time: 02/01/19  3:45 PM  Result Value Ref Range   Lipase 14 11 - 51 U/L    Comment: Performed at [facility], [place] [address] [place], [place] [address]  I-stat chem 8, ED (not at [facility] or [facility])     Status: Abnormal   Collection Time: 02/01/19  3:55 PM  Result Value Ref Range   Sodium 129 (L) 135 - 145 mmol/L   Potassium 4.4 3.5 - 5.1 mmol/L   Chloride 95 (L) 98 - 111 mmol/L   BUN 16 8 - 23 mg/dL   Creatinine, Ser 1.20 (H) 0.44 -  1.00 mg/dL   Glucose, Bld 87 70 - 99 mg/dL   Calcium, Ion 1.13 (L) 1.15 - 1.40 mmol/L   TCO2 24 22 - 32 mmol/L   Hemoglobin 11.6 (L) 12.0 - 15.0 g/dL   HCT 34.0 (L) 36.0 - 46.0 %  POC CBG, ED     Status: Abnormal   Collection Time: 02/01/19  7:18 PM  Result Value Ref Range   Glucose-Capillary 106 (H) 70 - 99 mg/dL   Comment 1 Notify RN    Comment 2 Document in Chart    Ct Abdomen Pelvis W Contrast  Result Date: 02/01/2019 CLINICAL DATA:  Lower abdominal pain, recent ureteral stent EXAM: CT ABDOMEN AND PELVIS WITH CONTRAST TECHNIQUE: Multidetector CT imaging of the abdomen and pelvis was performed using the standard protocol following bolus administration of intravenous contrast. CONTRAST:  161mL OMNIPAQUE IOHEXOL 300 MG/ML  SOLN COMPARISON:  CT abdomen pelvis, 06/29/2015 FINDINGS: Lower chest: No acute abnormality. Hepatobiliary: Benign, peripherally enhancing hemangiomata of the right lobe of the liver. No gallstones, gallbladder wall thickening, or biliary dilatation. Pancreas: Unremarkable. No pancreatic ductal dilatation or surrounding inflammatory changes. Spleen: Normal in size without significant abnormality. Adrenals/Urinary Tract: Adrenal glands are unremarkable. There is a 6 mm calculus at the right ureterovesicular junction with mild right hydronephrosis and hydroureter with hyperenhancement of the right ureter (series 3, image 71, 32). There is an elongated extraperitoneal hemorrhage about the left pelvic sidewall and about the bladder dome, which measures at least 9.0 x 5.6 x 6.3 cm (series 3, image  67, series 6, image 100, series 5, image 29). Foley catheter in the relatively decompressed urinary bladder. Stomach/Bowel: Stomach is within normal limits. Appendix appears normal. No evidence of bowel wall thickening, distention, or inflammatory changes. Severe descending and sigmoid diverticulosis. Vascular/Lymphatic: No significant vascular findings are present. No enlarged abdominal or  pelvic lymph nodes. Reproductive: No mass or other significant abnormality. Other: No abdominal wall hernia or abnormality. No abdominopelvic ascites. Musculoskeletal: No acute or significant osseous findings. IMPRESSION: 1. There is a 6 mm calculus at the right ureterovesicular junction with mild right hydronephrosis and hydroureter with hyperenhancement of the right ureter. 2. There is an elongated extraperitoneal hemorrhage about the left pelvic sidewall and about the bladder dome, which measures at least 9.0 x 5.6 x 6.3 cm, presumably related to recent stent placement or removal. 3. Diverticulosis without evidence of acute diverticulitis. These results were called by telephone at the time of interpretation on 02/01/2019 at 4:55 pm to provider [facility] , who verbally acknowledged these results. Electronically Signed   By: Eddie Candle M.D.   On: 02/01/2019 16:59    Pending Labs Unresulted Labs (From admission, onward)    Start     Ordered   02/01/19 1905  SARS CORONAVIRUS 2 (TAT 6-24 HRS) Nasopharyngeal Nasopharyngeal Swab  (Asymptomatic/Tier 3)  Once,   STAT    Question Answer Comment  Is this test for diagnosis or screening Screening   Symptomatic for COVID-19 as defined by CDC No   Hospitalized for COVID-19 No   Admitted to ICU for COVID-19 No   Previously tested for COVID-19 Yes   Resident in a congregate (group) care setting No   Employed in healthcare setting No   Pregnant No      02/01/19 1904   02/01/19 1129  Urine culture  ONCE - STAT,   STAT     02/01/19 1129   Signed and Held  Basic metabolic panel  Tomorrow morning,   R     Signed and Held   Signed and Held  CBC  Tomorrow morning,   R     Signed and Held          Vitals/Pain Today's Vitals   02/01/19 1703 02/01/19 1707 02/01/19 1800 02/01/19 1853  BP:   (!) 146/80 (!) 146/68  Pulse:   86 89  Resp:    16  Temp:    98.6 F (37 C)  TempSrc:    Oral  SpO2:   98% 94%  Weight:      Height:      PainSc: 8  2        Isolation Precautions No active isolations  Medications Medications  traMADol (ULTRAM) tablet 50 mg (has no administration in time range)  insulin aspart (novoLOG) injection 0-15 Units (0 Units Subcutaneous Not Given 02/01/19 1920)  insulin aspart (novoLOG) injection 0-5 Units (has no administration in time range)  NON FORMULARY 8 mg (has no administration in time range)  terazosin (HYTRIN) capsule 2 mg (has no administration in time range)  sodium chloride flush (NS) 0.9 % injection 3 mL (3 mLs Intravenous Given 02/01/19 1332)  morphine 4 MG/ML injection 4 mg (4 mg Intravenous Given 02/01/19 1331)  ondansetron (ZOFRAN) injection 4 mg (4 mg Intravenous Given 02/01/19 1331)  sodium chloride 0.9 % bolus 1,000 mL (0 mLs Intravenous Stopped 02/01/19 1600)  iohexol (OMNIPAQUE) 300 MG/ML solution 100 mL (100 mLs Intravenous Contrast Given 02/01/19 1614)  sodium chloride 0.9 % bolus 1,000 mL (1,000  mLs Intravenous New Bag/Given 02/01/19 1850)    Mobility  Low fall risk   Focused Assessments    R Recommendations: See Admitting Provider Note  Report given to:   Additional Notes:

## 2019-02-01 NOTE — ED Notes (Signed)
I attempted to collect labs and was unsuccessful. 

## 2019-02-01 NOTE — H&P (Addendum)
H&P  Chief Complaint: Urinary retention, right ureteral calculus, left pelvic sidewall hematoma  History of Present Illness: 73 year old female with a history of large right renal calculus status post right ureteroscopy with laser lithotripsy and ureteral stent placement on 01/23/2019 by Dr. Lovena Neighbours.  She subsequently came to the clinic and had her ureteral stent removed on Wednesday.  For the past day or so, she has had progressive abdominal distention and pain.  This prompted her to go to her primary care physician.  Examination revealed a distended abdomen and it was recommended that she go to the emergency department for evaluation.  In the emergency department, she was found to have urinary retention and Foley catheter was placed.  She had return of clear urine and had reportedly about 1.5 L output immediately.  Her creatinine is slightly elevated from baseline at 1.2 from a baseline of 1.  She states that after catheter placement, abdominal pain significantly improved.  However, she still has some persistent left lower quadrant pain which is actually what brought her to the internal medicine office to begin with to rule out diverticulitis.  For this reason, she underwent a CT scan of the abdomen and pelvis with contrast.  This revealed a persistent 6 mm calculus at the right ureterovesicular junction with mild right hydronephrosis and hydroureter.  She was also incidentally found to have a 9 cm elongated extraperitoneal hemorrhage on the left pelvic sidewall.  There is no obvious active extravasation.  She was found to have diverticulosis without evidence of diverticulitis.  She is not having significant right-sided pain.  She denies any fever.  Again, she feels much improved after bladder decompression.  Past Medical History:  Diagnosis Date  . Anxiety   . Asthma     per pt mild, inhaler as needed  . Chronic left shoulder pain    occasional pain  . DDD (degenerative disc disease), cervical    per  pt with C4--5 spur  . Environmental and seasonal allergies   . Family history of adverse reaction to anesthesia    per pt sister died intraoperatively at age of 28, 90, during T&A surgery "couldn't get her breathing, probably the ether used"  . History of diverticulitis of colon   . History of gastroesophageal reflux (GERD)    01-22-2019 per pt no issues in over a year ago  . History of left breast cancer oncologist--- dr Jana Hakim---  pt released 2017 (last office note in epic)   first dx 02/ 2004-- IDC , Stage I, Grade 2, ER negative--- (SWOG protocol [phone]) completed chemo 04/ 2004, comleted radiation 02-12-2003, and completed arimidex theray;   recurrance of same breast but second primary site  03/ 2012 left breast , Stage IIA, IDC, Grade 3-- completed chemo 01-25-2011 and hercepton 08-30-2011  . History of recurrent UTIs   . History of seizures    per pt age 56 , had 2 seizures 6 months apart, on meds until 1992 approx. , no seizures since (per pt probable cause per scan due to head injury age 33 due to [place])  . History of tachycardia    pt followed by dr Antony Madura while receiving  hercepton for breast cancer due to pt tacrchycardia and ef 45-50%, followed until lov in epc 09-01-2011, per echo ef 50-55%, pt was released   . Hyperlipidemia   . Hypertension   . Hypothyroidism    followed by pcp  . Neutropenia    chroinic  . Personal history of chemotherapy  breast cancer  completed 09/ 2004;  and for recurrance completed 01-25-2011 and completed hercepton 08-30-2011  . Personal history of radiation therapy    left breast completed 02-12-2003  . PONV (postoperative nausea and vomiting)    and slow to wake  . Renal calculus, right   . Type 2 diabetes mellitus ([place])    followed by pcp--- per pt check's surgar's twice weekly ,  fasting surger-- 95-120  . Urgency of urination   . Wears glasses    Past Surgical History:  Procedure Laterality Date  . CATARACT EXTRACTION W/ INTRAOCULAR  LENS  IMPLANT, BILATERAL  2013  approx.  . CYSTOSCOPY/URETEROSCOPY/HOLMIUM LASER/STENT PLACEMENT Right 01/23/2019   Procedure: CYSTOSCOPY/URETEROSCOPY/HOLMIUM LASER/STENT PLACEMENT;  Surgeon: Ceasar Mons, MD;  Location: [facility];  Service: Urology;  Laterality: Right;  . MASTECTOMY WITH AXILLARY LYMPH NODE DISSECTION Left 08/04/2010   @[facility]    and PAC insertion  . PARTIAL MASTECTOMY WITH AXILLARY SENTINEL LYMPH NODE BIOPSY Left 04-24-2002  @[facility]   . PLACEMENT DRAIN LEFT CHEST WALL SEROMA CAVITY  09/06/2010  . PORT-A-CATH REMOVAL  10/24/2011   Procedure: MINOR REMOVAL PORT-A-CATH;  Surgeon: Merrie Roof, MD;  Location: [place];  Service: General;  Laterality: Right;  . RETROPUBIC SLING  03-22-2010   dr grapey  @[facility]    Lynx suburetheral   . TONSILLECTOMY  child  . VAGINAL HYSTERECTOMY  02-19-2002  @[facility]    W  BSO    Home Medications:  (Not in a hospital admission)  Allergies:  Allergies  Allergen Reactions  . Carboplatin     Positive skin test documented.  . Latex Other (See Comments)    Break out in blisters  . Penicillins     Since childhood Has patient had a PCN reaction causing immediate rash, facial/tongue/throat swelling, SOB or lightheadedness with hypotension: Unknown Has patient had a PCN reaction causing severe rash involving mucus membranes or skin necrosis: Unknown Has patient had a PCN reaction that required hospitalization Unknown Has patient had a PCN reaction occurring within the last 10 years: No If all of the above answers are "NO", then may proceed with Cephalosporin use.   . Sulfa Drugs Cross Reactors Nausea And Vomiting  . Adhesive [Tape]   . Hydrocodone   . Iodine Other (See Comments)    Gallbladder dye contrast tablets, pt states is not allergic to Iodine Has had other "dye" test with no problems.  . Sulfa Antibiotics Rash    Family History  Problem Relation Age of Onset  . Cancer Father        soft  tissue of the jaw  . Diabetes Father    Social History:  reports that she has never smoked. She has never used smokeless tobacco. She reports that she does not drink alcohol or use drugs.  ROS: A complete review of systems was performed.  All systems are negative except for pertinent findings as noted. ROS   Physical Exam:  Vital signs in last 24 hours: Temp:  [98 F (36.7 C)] 98 F (36.7 C) (11/27 1052) Pulse Rate:  [79-95] 79 (11/27 1506) Resp:  [16-18] 16 (11/27 1506) BP: (142-167)/(70-88) 142/70 (11/27 1506) SpO2:  [97 %-100 %] 97 % (11/27 1506) Weight:  [63.5 kg] 63.5 kg (11/27 1052) General:  Alert and oriented, No acute distress HEENT: Normocephalic, atraumatic Neck: No JVD or lymphadenopathy Cardiovascular: Regular rate and rhythm Lungs: Regular rate and effort Abdomen: Soft, mild tenderness to palpation in the LEFT lower quadrant, nondistended,  no abdominal masses.  No right-sided pain Back: No CVA tenderness Extremities: No edema Neurologic: Grossly intact Genitourinary: Foley catheter draining clear urine  Laboratory Data:  Results for orders placed or performed during the hospital encounter of 02/01/19 (from the past 24 hour(s))  Urinalysis, Routine w reflex microscopic     Status: Abnormal   Collection Time: 02/01/19 11:54 AM  Result Value Ref Range   Color, Urine YELLOW YELLOW   APPearance CLEAR CLEAR   Specific Gravity, Urine 1.006 1.005 - 1.030   pH 7.0 5.0 - 8.0   Glucose, UA NEGATIVE NEGATIVE mg/dL   Hgb urine dipstick LARGE (A) NEGATIVE   Bilirubin Urine NEGATIVE NEGATIVE   Ketones, ur NEGATIVE NEGATIVE mg/dL   Protein, ur NEGATIVE NEGATIVE mg/dL   Nitrite NEGATIVE NEGATIVE   Leukocytes,Ua MODERATE (A) NEGATIVE   RBC / HPF 11-20 0 - 5 RBC/hpf   WBC, UA 11-20 0 - 5 WBC/hpf   Bacteria, UA RARE (A) NONE SEEN   Squamous Epithelial / LPF 0-5 0 - 5   Mucus PRESENT   CBC     Status: None   Collection Time: 02/01/19 12:30 PM  Result Value Ref Range    WBC 9.3 4.0 - 10.5 K/uL   RBC 4.59 3.87 - 5.11 MIL/uL   Hemoglobin 12.7 12.0 - 15.0 g/dL   HCT 37.3 36.0 - 46.0 %   MCV 81.3 80.0 - 100.0 fL   MCH 27.7 26.0 - 34.0 pg   MCHC 34.0 30.0 - 36.0 g/dL   RDW 12.8 11.5 - 15.5 %   Platelets 254 150 - 400 K/uL   nRBC 0.0 0.0 - 0.2 %  Lipase, blood     Status: None   Collection Time: 02/01/19  3:45 PM  Result Value Ref Range   Lipase 14 11 - 51 U/L  I-stat chem 8, ED (not at [facility] or [facility])     Status: Abnormal   Collection Time: 02/01/19  3:55 PM  Result Value Ref Range   Sodium 129 (L) 135 - 145 mmol/L   Potassium 4.4 3.5 - 5.1 mmol/L   Chloride 95 (L) 98 - 111 mmol/L   BUN 16 8 - 23 mg/dL   Creatinine, Ser 1.20 (H) 0.44 - 1.00 mg/dL   Glucose, Bld 87 70 - 99 mg/dL   Calcium, Ion 1.13 (L) 1.15 - 1.40 mmol/L   TCO2 24 22 - 32 mmol/L   Hemoglobin 11.6 (L) 12.0 - 15.0 g/dL   HCT 34.0 (L) 36.0 - 46.0 %   No results found for this or any previous visit (from the past 240 hour(s)). Creatinine: Recent Labs    02/01/19 1555  CREATININE 1.20*   CT scan personally reviewed and is detailed in the history of present illness.  Impression/Assessment:  Right ureteral calculus Hydronephrosis secondary to obstructing calculus Left extraperitoneal pelvic hematoma Urinary retention  Plan:  Maintain Foley catheter Obtain CT cystogram to rule out communication with the bladder.  Unusual presentation to have a left pelvic hematoma especially given she had right-sided surgery.  She denies any trauma. Light IV fluids Ciprofloxacin twice daily Oxybutynin as needed for bladder spasms Recheck hemoglobin in the morning to ensure stability She is allergic to sulfa and therefore I ordered Terazosin instead of tamsulosin to hopefully facilitate stone passage.  This can be observed conservatively but I will make her n.p.o. at midnight in the event that she needs a procedure in the morning if she has increased pain or worsening creatinine.  Cornelia Copa  Clydia Llano,  III 02/01/2019, 6:37 PM

## 2019-02-01 NOTE — ED Notes (Signed)
Did bladder scan and patient had between 800 and 900 in her bladder

## 2019-02-02 ENCOUNTER — Other Ambulatory Visit: Payer: Self-pay

## 2019-02-02 DIAGNOSIS — E785 Hyperlipidemia, unspecified: Secondary | ICD-10-CM | POA: Diagnosis present

## 2019-02-02 DIAGNOSIS — Z853 Personal history of malignant neoplasm of breast: Secondary | ICD-10-CM | POA: Diagnosis not present

## 2019-02-02 DIAGNOSIS — K661 Hemoperitoneum: Secondary | ICD-10-CM | POA: Diagnosis present

## 2019-02-02 DIAGNOSIS — Z90722 Acquired absence of ovaries, bilateral: Secondary | ICD-10-CM | POA: Diagnosis not present

## 2019-02-02 DIAGNOSIS — Z9012 Acquired absence of left breast and nipple: Secondary | ICD-10-CM | POA: Diagnosis not present

## 2019-02-02 DIAGNOSIS — Z79890 Hormone replacement therapy (postmenopausal): Secondary | ICD-10-CM | POA: Diagnosis not present

## 2019-02-02 DIAGNOSIS — Z9842 Cataract extraction status, left eye: Secondary | ICD-10-CM | POA: Diagnosis not present

## 2019-02-02 DIAGNOSIS — Z9841 Cataract extraction status, right eye: Secondary | ICD-10-CM | POA: Diagnosis not present

## 2019-02-02 DIAGNOSIS — I10 Essential (primary) hypertension: Secondary | ICD-10-CM | POA: Diagnosis present

## 2019-02-02 DIAGNOSIS — Z7984 Long term (current) use of oral hypoglycemic drugs: Secondary | ICD-10-CM | POA: Diagnosis not present

## 2019-02-02 DIAGNOSIS — Z961 Presence of intraocular lens: Secondary | ICD-10-CM | POA: Diagnosis present

## 2019-02-02 DIAGNOSIS — E119 Type 2 diabetes mellitus without complications: Secondary | ICD-10-CM | POA: Diagnosis present

## 2019-02-02 DIAGNOSIS — Z79899 Other long term (current) drug therapy: Secondary | ICD-10-CM | POA: Diagnosis not present

## 2019-02-02 DIAGNOSIS — Z90710 Acquired absence of both cervix and uterus: Secondary | ICD-10-CM | POA: Diagnosis not present

## 2019-02-02 DIAGNOSIS — D62 Acute posthemorrhagic anemia: Secondary | ICD-10-CM | POA: Diagnosis present

## 2019-02-02 DIAGNOSIS — R319 Hematuria, unspecified: Secondary | ICD-10-CM | POA: Diagnosis present

## 2019-02-02 DIAGNOSIS — R339 Retention of urine, unspecified: Secondary | ICD-10-CM | POA: Diagnosis present

## 2019-02-02 DIAGNOSIS — Z87440 Personal history of urinary (tract) infections: Secondary | ICD-10-CM | POA: Diagnosis not present

## 2019-02-02 DIAGNOSIS — Z833 Family history of diabetes mellitus: Secondary | ICD-10-CM | POA: Diagnosis not present

## 2019-02-02 DIAGNOSIS — Z923 Personal history of irradiation: Secondary | ICD-10-CM | POA: Diagnosis not present

## 2019-02-02 DIAGNOSIS — N132 Hydronephrosis with renal and ureteral calculous obstruction: Secondary | ICD-10-CM | POA: Diagnosis present

## 2019-02-02 DIAGNOSIS — K5790 Diverticulosis of intestine, part unspecified, without perforation or abscess without bleeding: Secondary | ICD-10-CM | POA: Diagnosis present

## 2019-02-02 DIAGNOSIS — Z9221 Personal history of antineoplastic chemotherapy: Secondary | ICD-10-CM | POA: Diagnosis not present

## 2019-02-02 DIAGNOSIS — E039 Hypothyroidism, unspecified: Secondary | ICD-10-CM | POA: Diagnosis present

## 2019-02-02 DIAGNOSIS — Z20828 Contact with and (suspected) exposure to other viral communicable diseases: Secondary | ICD-10-CM | POA: Diagnosis present

## 2019-02-02 LAB — BASIC METABOLIC PANEL
Anion gap: 8 (ref 5–15)
BUN: 12 mg/dL (ref 8–23)
CO2: 21 mmol/L — ABNORMAL LOW (ref 22–32)
Calcium: 8 mg/dL — ABNORMAL LOW (ref 8.9–10.3)
Chloride: 105 mmol/L (ref 98–111)
Creatinine, Ser: 1.03 mg/dL — ABNORMAL HIGH (ref 0.44–1.00)
GFR calc Af Amer: >60 mL/min (ref >60)
GFR calc non Af Amer: 54 mL/min — ABNORMAL LOW (ref >60)
Glucose, Bld: 119 mg/dL — ABNORMAL HIGH (ref 70–99)
Potassium: 3.8 mmol/L (ref 3.5–5.1)
Sodium: 134 mmol/L — ABNORMAL LOW (ref 135–145)

## 2019-02-02 LAB — SARS CORONAVIRUS 2 (TAT 6-24 HRS): SARS Coronavirus 2: NEGATIVE

## 2019-02-02 LAB — GLUCOSE, CAPILLARY
Glucose-Capillary: 93 mg/dL (ref 70–99)
Glucose-Capillary: 75 mg/dL (ref 70–99)
Glucose-Capillary: 110 mg/dL — ABNORMAL HIGH (ref 70–99)
Glucose-Capillary: 146 mg/dL — ABNORMAL HIGH (ref 70–99)

## 2019-02-02 LAB — HEMOGLOBIN AND HEMATOCRIT, BLOOD
HCT: 28.1 % — ABNORMAL LOW (ref 36.0–46.0)
Hemoglobin: 9.1 g/dL — ABNORMAL LOW (ref 12.0–15.0)

## 2019-02-02 LAB — CBC
HCT: 31.6 % — ABNORMAL LOW (ref 36.0–46.0)
Hemoglobin: 10.2 g/dL — ABNORMAL LOW (ref 12.0–15.0)
MCH: 27.1 pg (ref 26.0–34.0)
MCHC: 32.3 g/dL (ref 30.0–36.0)
MCV: 84 fL (ref 80.0–100.0)
Platelets: 270 10*3/uL (ref 150–400)
RBC: 3.76 MIL/uL — ABNORMAL LOW (ref 3.87–5.11)
RDW: 13 % (ref 11.5–15.5)
WBC: 6.1 10*3/uL (ref 4.0–10.5)
nRBC: 0 % (ref 0.0–0.2)

## 2019-02-02 LAB — URINE CULTURE: Culture: NO GROWTH

## 2019-02-02 MED ORDER — SODIUM CHLORIDE 0.9 % IV SOLN: INTRAVENOUS | Status: DC

## 2019-02-02 MED ORDER — CHLORHEXIDINE GLUCONATE CLOTH 2 % EX PADS
6.0000 | MEDICATED_PAD | Freq: Every day | CUTANEOUS | Status: DC
  Administered 2019-02-02: 6 via TOPICAL

## 2019-02-02 NOTE — Progress Notes (Signed)
Pt ambulating in hall became dizzy and nauseous BP prior to ambulating 129/61 BP when became dizzy 99/84, on-call made aware with no new orders given, will continue to observe.

## 2019-02-02 NOTE — Progress Notes (Signed)
Review of hgb shows that she continues to trend down and as such we will keep her overnight again and recheck in the AM.  Clinically she is otherwise doing well.

## 2019-02-02 NOTE — Progress Notes (Signed)
Urology Inpatient Progress Report  Urinary retention [R33.9] Peritoneal hemorrhage [K66.1]        Intv/Subj: No acute events overnight. Patient is without complaint. Cystogram was negative last night Hemoglobin is trending down.  However, may be dilutional.  Active Problems:   Renal calculi  Current Facility-Administered Medications  Medication Dose Route Frequency Provider Last Rate Last Dose  . acetaminophen (TYLENOL) tablet 650 mg  650 mg Oral Q4H PRN Marton Redwood III, MD      . albuterol (PROVENTIL) (2.5 MG/3ML) 0.083% nebulizer solution 3 mL  3 mL Inhalation PRN Marton Redwood III, MD      . Chlorhexidine Gluconate Cloth 2 % PADS 6 each  6 each Topical Daily Marton Redwood III, MD      . ciprofloxacin (CIPRO) tablet 500 mg  500 mg Oral BID Marton Redwood III, MD      . diphenhydrAMINE (BENADRYL) injection 12.5 mg  12.5 mg Intravenous Q6H PRN Marton Redwood III, MD       Or  . diphenhydrAMINE (BENADRYL) 12.5 MG/5ML elixir 12.5 mg  12.5 mg Oral Q6H PRN Marton Redwood III, MD      . docusate sodium (COLACE) capsule 100 mg  100 mg Oral BID Marton Redwood III, MD      . famotidine (PEPCID) tablet 20 mg  20 mg Oral Daily PRN Marton Redwood III, MD      . insulin aspart (novoLOG) injection 0-15 Units  0-15 Units Subcutaneous TID WC Marton Redwood III, MD      . insulin aspart (novoLOG) injection 0-5 Units  0-5 Units Subcutaneous QHS Marton Redwood III, MD      . levothyroxine (SYNTHROID) tablet 100 mcg  100 mcg Oral QAC breakfast Marton Redwood III, MD   100 mcg at 02/02/19 0656  . lisinopril (ZESTRIL) tablet 5 mg  5 mg Oral Daily Marton Redwood III, MD      . morphine 2 MG/ML injection 2-4 mg  2-4 mg Intravenous Q2H PRN Marton Redwood III, MD      . ondansetron [facility]) injection 4 mg  4 mg Intravenous Q4H PRN Marton Redwood III, MD   4 mg at 02/01/19 2340  . opium-belladonna (B&O SUPPRETTES) 16.2-60 MG suppository 1 suppository  1 suppository Rectal Q6H PRN Marton Redwood III,  MD      . oxybutynin (DITROPAN) tablet 5 mg  5 mg Oral Q8H PRN Marton Redwood III, MD      . polyvinyl alcohol (LIQUIFILM TEARS) 1.4 % ophthalmic solution 1 drop  1 drop Both Eyes PRN Marton Redwood III, MD      . pravastatin (PRAVACHOL) tablet 40 mg  40 mg Oral Daily Marton Redwood III, MD      . senna (SENOKOT) tablet 8.6 mg  1 tablet Oral BID Marton Redwood III, MD      . silodosin (RAPAFLO) capsule 8 mg  8 mg Oral Q breakfast Marton Redwood III, MD      . terazosin (HYTRIN) capsule 2 mg  2 mg Oral QHS Marton Redwood III, MD      . traMADol [facility]) tablet 50 mg  50 mg Oral Q6H PRN Lucas Mallow, MD       Facility-Administered Medications Ordered in Other Encounters  Medication Dose Route Frequency Provider Last Rate Last Dose  . sodium chloride 0.9 % injection 10 mL  10 mL Intracatheter PRN  Eston Esters, MD   10 mL at 03/15/11 1631     Objective: Vital: Vitals:   02/01/19 1853 02/01/19 1900 02/01/19 2116 02/02/19 0636  BP: (!) 146/68 (!) 156/64 (!) 151/73 (!) 146/79  Pulse: 89 92 88 81  Resp: 16  15 16   Temp: 98.6 F (37 C)   97.9 F (36.6 C)  TempSrc: Oral  Oral Oral  SpO2: 94% 95% 100% 97%  Weight:      Height:       I/Os: I/O last 3 completed shifts: In: 385 [I.V.:385] Out: 1950 [Urine:1950]  Physical Exam:  General: Patient is in no apparent distress Lungs: Normal respiratory effort, chest expands symmetrically. GI: The abdomen is soft and nontender except for mild tenderness in the left lower quadrant without mass. Foley: Draining clear yellow urine Ext: lower extremities symmetric  Lab Results: Recent Labs    02/01/19 1230 02/01/19 1555 02/02/19 0527  WBC 9.3  --  6.1  HGB 12.7 11.6* 10.2*  HCT 37.3 34.0* 31.6*   Recent Labs    02/01/19 1555 02/02/19 0527  NA 129* 134*  K 4.4 3.8  CL 95* 105  CO2  --  21*  GLUCOSE 87 119*  BUN 16 12  CREATININE 1.20* 1.03*  CALCIUM  --  8.0*   No results for input(s): LABPT, INR in the last 72 hours. No  results for input(s): LABURIN in the last 72 hours. Results for orders placed or performed during the hospital encounter of 02/01/19  SARS CORONAVIRUS 2 (TAT 6-24 HRS) Nasopharyngeal Nasopharyngeal Swab     Status: None   Collection Time: 02/01/19  7:53 PM   Specimen: Nasopharyngeal Swab  Result Value Ref Range Status   SARS Coronavirus 2 NEGATIVE NEGATIVE Final    Comment: (NOTE) SARS-CoV-2 target nucleic acids are NOT DETECTED. The SARS-CoV-2 RNA is generally detectable in upper and lower respiratory specimens during the acute phase of infection. Negative results do not preclude SARS-CoV-2 infection, do not rule out co-infections with other pathogens, and should not be used as the sole basis for treatment or other patient management decisions. Negative results must be combined with clinical observations, patient history, and epidemiological information. The expected result is Negative. Fact Sheet for Patients: SugarRoll.be Fact Sheet for Healthcare Providers: https://www.woods-mathews.com/ This test is not yet approved or cleared by the [place] FDA and  has been authorized for detection and/or diagnosis of SARS-CoV-2 by FDA under an Emergency Use Authorization (EUA). This EUA will remain  in effect (meaning this test can be used) for the duration of the COVID-19 declaration under Section 56 4(b)(1) of the Act, 21 U.S.C. section 360bbb-3(b)(1), unless the authorization is terminated or revoked sooner. Performed at [facility], [place] [address], [place], [place] [address]     Studies/Results: Ct Pelvis Wo Contrast  Result Date: 02/01/2019 CLINICAL DATA:  Pelvic pain. EXAM: CT PELVIS WITHOUT CONTRAST TECHNIQUE: Multidetector CT imaging of the pelvis was performed following the standard protocol without intravenous contrast. COMPARISON:  CT scan of same day. FINDINGS: Urinary Tract: Left ureter is unremarkable. Mild right ureteral  dilatation is noted with probable calculus at right ureterovesical junction as noted on prior exam. Contrast filling of urinary bladder is noted. Foley catheter is noted. No extravasation or leakage is noted. Bowel: There is no evidence of bowel obstruction. Sigmoid diverticulosis is noted without inflammation. Vascular/Lymphatic: No pathologically enlarged lymph nodes. No significant vascular abnormality seen. Reproductive:  No mass or other significant abnormality Other: Moderate size  left-sided pelvic sidewall hematoma is noted which is displacing the urinary bladder to the right. Musculoskeletal: No suspicious bone lesions identified. IMPRESSION: Moderate size left-sided pelvic hematoma is noted which displaces the urinary bladder to the right. There is no evidence of bladder extravasation or injury. Mild right ureteral dilatation is again noted with probable calculus at right ureterovesical junction. Sigmoid diverticulosis without inflammation. Electronically Signed   By: Marijo Conception M.D.   On: 02/01/2019 20:38   Ct Abdomen Pelvis W Contrast  Result Date: 02/01/2019 CLINICAL DATA:  Lower abdominal pain, recent ureteral stent EXAM: CT ABDOMEN AND PELVIS WITH CONTRAST TECHNIQUE: Multidetector CT imaging of the abdomen and pelvis was performed using the standard protocol following bolus administration of intravenous contrast. CONTRAST:  160mL OMNIPAQUE IOHEXOL 300 MG/ML  SOLN COMPARISON:  CT abdomen pelvis, 06/29/2015 FINDINGS: Lower chest: No acute abnormality. Hepatobiliary: Benign, peripherally enhancing hemangiomata of the right lobe of the liver. No gallstones, gallbladder wall thickening, or biliary dilatation. Pancreas: Unremarkable. No pancreatic ductal dilatation or surrounding inflammatory changes. Spleen: Normal in size without significant abnormality. Adrenals/Urinary Tract: Adrenal glands are unremarkable. There is a 6 mm calculus at the right ureterovesicular junction with mild right  hydronephrosis and hydroureter with hyperenhancement of the right ureter (series 3, image 71, 32). There is an elongated extraperitoneal hemorrhage about the left pelvic sidewall and about the bladder dome, which measures at least 9.0 x 5.6 x 6.3 cm (series 3, image 67, series 6, image 100, series 5, image 29). Foley catheter in the relatively decompressed urinary bladder. Stomach/Bowel: Stomach is within normal limits. Appendix appears normal. No evidence of bowel wall thickening, distention, or inflammatory changes. Severe descending and sigmoid diverticulosis. Vascular/Lymphatic: No significant vascular findings are present. No enlarged abdominal or pelvic lymph nodes. Reproductive: No mass or other significant abnormality. Other: No abdominal wall hernia or abnormality. No abdominopelvic ascites. Musculoskeletal: No acute or significant osseous findings. IMPRESSION: 1. There is a 6 mm calculus at the right ureterovesicular junction with mild right hydronephrosis and hydroureter with hyperenhancement of the right ureter. 2. There is an elongated extraperitoneal hemorrhage about the left pelvic sidewall and about the bladder dome, which measures at least 9.0 x 5.6 x 6.3 cm, presumably related to recent stent placement or removal. 3. Diverticulosis without evidence of acute diverticulitis. These results were called by telephone at the time of interpretation on 02/01/2019 at 4:55 pm to provider [facility] , who verbally acknowledged these results. Electronically Signed   By: Eddie Candle M.D.   On: 02/01/2019 16:59    Assessment: Right ureteral calculus Right hydronephrosis secondary to obstructing calculus Urinary retention Extraperitoneal pelvic hematoma Acute blood loss anemia  Plan: Continue Foley catheter for about 1 week.  I will continue ciprofloxacin for another couple of days.  Plan to observe her this morning and repeat a hemoglobin at 12 PM.  I would like to see the stable prior to  discharging her.  If it is lower, we will observe her for another night.  In regards to her calculus, she will proceed with continued medical expulsive therapy.   Link Snuffer, MD Urology 02/02/2019, 9:16 AM

## 2019-02-02 NOTE — Progress Notes (Signed)
Pt refused all HS medications. Pt stated that she has a list of her medications and is only comfortable taking those medications.

## 2019-02-03 LAB — CBC
HCT: 31.6 % — ABNORMAL LOW (ref 36.0–46.0)
Hemoglobin: 10.3 g/dL — ABNORMAL LOW (ref 12.0–15.0)
MCH: 27.2 pg (ref 26.0–34.0)
MCHC: 32.6 g/dL (ref 30.0–36.0)
MCV: 83.4 fL (ref 80.0–100.0)
Platelets: 272 10*3/uL (ref 150–400)
RBC: 3.79 MIL/uL — ABNORMAL LOW (ref 3.87–5.11)
RDW: 13.4 % (ref 11.5–15.5)
WBC: 7.1 10*3/uL (ref 4.0–10.5)
nRBC: 0 % (ref 0.0–0.2)

## 2019-02-03 LAB — TYPE AND SCREEN
ABO/RH(D): A POS
Antibody Screen: NEGATIVE

## 2019-02-03 LAB — GLUCOSE, CAPILLARY: Glucose-Capillary: 95 mg/dL (ref 70–99)

## 2019-02-03 LAB — BASIC METABOLIC PANEL
Anion gap: 8 (ref 5–15)
BUN: 12 mg/dL (ref 8–23)
CO2: 23 mmol/L (ref 22–32)
Calcium: 8.3 mg/dL — ABNORMAL LOW (ref 8.9–10.3)
Chloride: 100 mmol/L (ref 98–111)
Creatinine, Ser: 1.04 mg/dL — ABNORMAL HIGH (ref 0.44–1.00)
GFR calc Af Amer: >60 mL/min (ref >60)
GFR calc non Af Amer: 53 mL/min — ABNORMAL LOW (ref >60)
Glucose, Bld: 127 mg/dL — ABNORMAL HIGH (ref 70–99)
Potassium: 4 mmol/L (ref 3.5–5.1)
Sodium: 131 mmol/L — ABNORMAL LOW (ref 135–145)

## 2019-02-03 MED ORDER — SILODOSIN 8 MG PO CAPS: 8.0000 mg | ORAL_CAPSULE | Freq: Every day | ORAL | 0 refills | Status: DC

## 2019-02-03 NOTE — Progress Notes (Signed)
Urology Inpatient Progress Report  Urinary retention [R33.9] Peritoneal hemorrhage [K66.1]        Intv/Subj: Late last evening the patient was walking, had been walking for at least 10 minutes then developed acute onset dizziness.  Prior to this episode she was ambulating without any difficulty.  She had walked several times throughout the day without any associated weakness or dizziness. This morning, patient is without complaint. Hemoglobin continued to trend down yesterday afternoon, and she was kept overnight.  Active Problems:   Renal calculi  Current Facility-Administered Medications  Medication Dose Route Frequency Provider Last Rate Last Dose  . acetaminophen (TYLENOL) tablet 650 mg  650 mg Oral Q4H PRN Marton Redwood III, MD      . albuterol (PROVENTIL) (2.5 MG/3ML) 0.083% nebulizer solution 3 mL  3 mL Inhalation PRN Marton Redwood III, MD      . Chlorhexidine Gluconate Cloth 2 % PADS 6 each  6 each Topical Daily Lucas Mallow, MD   6 each at 02/02/19 1857  . ciprofloxacin (CIPRO) tablet 500 mg  500 mg Oral BID Marton Redwood III, MD   500 mg at 02/02/19 2100  . diphenhydrAMINE (BENADRYL) injection 12.5 mg  12.5 mg Intravenous Q6H PRN Marton Redwood III, MD       Or  . diphenhydrAMINE (BENADRYL) 12.5 MG/5ML elixir 12.5 mg  12.5 mg Oral Q6H PRN Marton Redwood III, MD      . docusate sodium (COLACE) capsule 100 mg  100 mg Oral BID Marton Redwood III, MD   100 mg at 02/02/19 2128  . famotidine (PEPCID) tablet 20 mg  20 mg Oral Daily PRN Marton Redwood III, MD   20 mg at 02/02/19 1705  . insulin aspart (novoLOG) injection 0-15 Units  0-15 Units Subcutaneous TID WC Marton Redwood III, MD   2 Units at 02/02/19 [phone]  . insulin aspart (novoLOG) injection 0-5 Units  0-5 Units Subcutaneous QHS Marton Redwood III, MD      . levothyroxine (SYNTHROID) tablet 100 mcg  100 mcg Oral QAC breakfast Marton Redwood III, MD   100 mcg at 02/03/19 [phone]  . lisinopril (ZESTRIL) tablet 5 mg  5 mg  Oral Daily Marton Redwood III, MD   5 mg at 02/02/19 1129  . morphine 2 MG/ML injection 2-4 mg  2-4 mg Intravenous Q2H PRN Marton Redwood III, MD      . ondansetron [facility]) injection 4 mg  4 mg Intravenous Q4H PRN Marton Redwood III, MD   4 mg at 02/01/19 2340  . opium-belladonna (B&O SUPPRETTES) 16.2-60 MG suppository 1 suppository  1 suppository Rectal Q6H PRN Marton Redwood III, MD      . oxybutynin (DITROPAN) tablet 5 mg  5 mg Oral Q8H PRN Marton Redwood III, MD      . polyvinyl alcohol (LIQUIFILM TEARS) 1.4 % ophthalmic solution 1 drop  1 drop Both Eyes PRN Marton Redwood III, MD      . pravastatin (PRAVACHOL) tablet 40 mg  40 mg Oral Daily Marton Redwood III, MD   40 mg at 02/02/19 1130  . senna (SENOKOT) tablet 8.6 mg  1 tablet Oral BID Marton Redwood III, MD   8.6 mg at 02/02/19 2126  . silodosin (RAPAFLO) capsule 8 mg  8 mg Oral Q breakfast Marton Redwood III, MD      . terazosin (HYTRIN) capsule 2 mg  2  mg Oral QHS Marton Redwood III, MD   2 mg at 02/02/19 2128  . traMADol (ULTRAM) tablet 50 mg  50 mg Oral Q6H PRN Lucas Mallow, MD       Facility-Administered Medications Ordered in Other Encounters  Medication Dose Route Frequency Provider Last Rate Last Dose  . sodium chloride 0.9 % injection 10 mL  10 mL Intracatheter PRN Eston Esters, MD   10 mL at 03/15/11 1631     Objective: Vital: Vitals:   02/02/19 1300 02/02/19 2236 02/02/19 2252 02/03/19 0715  BP: (!) 159/81 129/61 99/84 126/77  Pulse: 88 (!) 103 96 88  Resp: 16 17  19   Temp: 98.1 F (36.7 C) 98.2 F (36.8 C)  98.1 F (36.7 C)  TempSrc: Oral Oral  Oral  SpO2: 100% 98%  100%  Weight:      Height:       I/Os: I/O last 3 completed shifts: In: 385 [I.V.:385] Out: 5125 [Urine:5125]  Physical Exam:  General: Patient is in no apparent distress Lungs: Normal respiratory effort, chest expands symmetrically. GI: The abdomen is soft and nontender except for mild tenderness in the left lower quadrant without  mass. Foley: Draining clear yellow urine Ext: lower extremities symmetric  Lab Results: Recent Labs    02/01/19 1230  02/02/19 0527 02/02/19 1219 02/03/19 0543  WBC 9.3  --  6.1  --  7.1  HGB 12.7   < > 10.2* 9.1* 10.3*  HCT 37.3   < > 31.6* 28.1* 31.6*   < > = values in this interval not displayed.   Recent Labs    02/01/19 1555 02/02/19 0527 02/03/19 0543  NA 129* 134* 131*  K 4.4 3.8 4.0  CL 95* 105 100  CO2  --  21* 23  GLUCOSE 87 119* 127*  BUN 16 12 12   CREATININE 1.20* 1.03* 1.04*  CALCIUM  --  8.0* 8.3*   No results for input(s): LABPT, INR in the last 72 hours. No results for input(s): LABURIN in the last 72 hours. Results for orders placed or performed during the hospital encounter of 02/01/19  Urine culture     Status: None   Collection Time: 02/01/19 11:54 AM   Specimen: Urine, Random  Result Value Ref Range Status   Specimen Description   Final    URINE, RANDOM Performed at [place] [address], [place], [place] [address]    Special Requests   Final    NONE Performed at [facility], [place] [address], [place], [place] [address]    Culture   Final    NO GROWTH Performed at [facility], [place] [address] [place], [place] [address]    Report Status 02/02/2019 FINAL  Final  SARS CORONAVIRUS 2 (TAT 6-24 HRS) Nasopharyngeal Nasopharyngeal Swab     Status: None   Collection Time: 02/01/19  7:53 PM   Specimen: Nasopharyngeal Swab  Result Value Ref Range Status   SARS Coronavirus 2 NEGATIVE NEGATIVE Final    Comment: (NOTE) SARS-CoV-2 target nucleic acids are NOT DETECTED. The SARS-CoV-2 RNA is generally detectable in upper and lower respiratory specimens during the acute phase of infection. Negative results do not preclude SARS-CoV-2 infection, do not rule out co-infections with other pathogens, and should not be used as the sole basis for treatment or other patient management decisions. Negative  results must be combined with clinical observations, patient history, and epidemiological information. The expected result is Negative.  Fact Sheet for Patients: SugarRoll.be Fact Sheet for Healthcare Providers: https://www.woods-mathews.com/ This test is not yet approved or cleared by the [place] FDA and  has been authorized for detection and/or diagnosis of SARS-CoV-2 by FDA under an Emergency Use Authorization (EUA). This EUA will remain  in effect (meaning this test can be used) for the duration of the COVID-19 declaration under Section 56 4(b)(1) of the Act, 21 U.S.C. section 360bbb-3(b)(1), unless the authorization is terminated or revoked sooner. Performed at [facility], [place] [address] [place], [place] [address]     Studies/Results: Ct Pelvis Wo Contrast  Result Date: 02/01/2019 CLINICAL DATA:  Pelvic pain. EXAM: CT PELVIS WITHOUT CONTRAST TECHNIQUE: Multidetector CT imaging of the pelvis was performed following the standard protocol without intravenous contrast. COMPARISON:  CT scan of same day. FINDINGS: Urinary Tract: Left ureter is unremarkable. Mild right ureteral dilatation is noted with probable calculus at right ureterovesical junction as noted on prior exam. Contrast filling of urinary bladder is noted. Foley catheter is noted. No extravasation or leakage is noted. Bowel: There is no evidence of bowel obstruction. Sigmoid diverticulosis is noted without inflammation. Vascular/Lymphatic: No pathologically enlarged lymph nodes. No significant vascular abnormality seen. Reproductive:  No mass or other significant abnormality Other: Moderate size left-sided pelvic sidewall hematoma is noted which is displacing the urinary bladder to the right. Musculoskeletal: No suspicious bone lesions identified. IMPRESSION: Moderate size left-sided pelvic hematoma is noted which displaces the urinary bladder to the right. There is no  evidence of bladder extravasation or injury. Mild right ureteral dilatation is again noted with probable calculus at right ureterovesical junction. Sigmoid diverticulosis without inflammation. Electronically Signed   By: Marijo Conception M.D.   On: 02/01/2019 20:38   Ct Abdomen Pelvis W Contrast  Result Date: 02/01/2019 CLINICAL DATA:  Lower abdominal pain, recent ureteral stent EXAM: CT ABDOMEN AND PELVIS WITH CONTRAST TECHNIQUE: Multidetector CT imaging of the abdomen and pelvis was performed using the standard protocol following bolus administration of intravenous contrast. CONTRAST:  127mL OMNIPAQUE IOHEXOL 300 MG/ML  SOLN COMPARISON:  CT abdomen pelvis, 06/29/2015 FINDINGS: Lower chest: No acute abnormality. Hepatobiliary: Benign, peripherally enhancing hemangiomata of the right lobe of the liver. No gallstones, gallbladder wall thickening, or biliary dilatation. Pancreas: Unremarkable. No pancreatic ductal dilatation or surrounding inflammatory changes. Spleen: Normal in size without significant abnormality. Adrenals/Urinary Tract: Adrenal glands are unremarkable. There is a 6 mm calculus at the right ureterovesicular junction with mild right hydronephrosis and hydroureter with hyperenhancement of the right ureter (series 3, image 71, 32). There is an elongated extraperitoneal hemorrhage about the left pelvic sidewall and about the bladder dome, which measures at least 9.0 x 5.6 x 6.3 cm (series 3, image 67, series 6, image 100, series 5, image 29). Foley catheter in the relatively decompressed urinary bladder. Stomach/Bowel: Stomach is within normal limits. Appendix appears normal. No evidence of bowel wall thickening, distention, or inflammatory changes. Severe descending and sigmoid diverticulosis. Vascular/Lymphatic: No significant vascular findings are present. No enlarged abdominal or pelvic lymph nodes. Reproductive: No mass or other significant abnormality. Other: No abdominal wall hernia or  abnormality. No abdominopelvic ascites. Musculoskeletal: No acute or significant osseous findings. IMPRESSION: 1. There is a 6 mm calculus at the right ureterovesicular junction with mild right hydronephrosis and hydroureter with hyperenhancement of the right ureter. 2. There is an elongated extraperitoneal hemorrhage about the left pelvic sidewall and about the bladder dome, which measures at least 9.0 x 5.6 x 6.3 cm,  presumably related to recent stent placement or removal. 3. Diverticulosis without evidence of acute diverticulitis. These results were called by telephone at the time of interpretation on 02/01/2019 at 4:55 pm to provider [facility] , who verbally acknowledged these results. Electronically Signed   By: Eddie Candle M.D.   On: 02/01/2019 16:59    Assessment: Right ureteral calculus Right hydronephrosis secondary to obstructing calculus Urinary retention Extraperitoneal pelvic hematoma Acute blood loss anemia  Plan: Fortunately, the bleeding appears to have stopped.  The patient is now doing well.  Our plan is to discharge her home today.   Link Snuffer, MD Urology 02/03/2019, 9:14 AM

## 2019-02-03 NOTE — Discharge Instructions (Signed)
Foley Catheter Care, Adult A soft, flexible tube (Foley catheter) has been placed in your bladder. This may be done to temporarily help with urine drainage after an operation or to relieve blockage from an enlarged prostate gland. HOME CARE INSTRUCTIONS  If you are going home with a Foley catheter in place, follow these instructions: Taking Care of the Catheter: Keep the area where the catheter leaves your body clean. Attach the catheter to the leg so there is no tension on the catheter. Keep the drainage bag below the level of the bladder, but keep it OFF the floor. Do not take long soaking baths.  Wash your hands before touching ANYTHING related to the catheter or bag. Using mild soap and warm water on a washcloth: Clean the area closest to the catheter insertion site using a circular motion around the catheter. Clean the catheter itself by wiping AWAY from the insertion site for several inches down the tube. NEVER wipe upward as this could sweep bacteria up into the urethra (tube in your body that normally drains the bladder) and cause infection. Taking Care of the Drainage Bags: Two drainage bags will be taken home: a large overnight drainage bag, and a smaller leg bag which fits underneath clothing. It is okay to wear the overnight bag at any time, but NEVER wear the smaller leg bag at night. Keep the drainage bag well below the level of your bladder. This prevents backflow of urine into the bladder and allows the urine to drain freely. Anchor the tubing to your leg to prevent pulling or tension on the catheter. Use tape or a leg strap provided by the hospital. Empty the drainage bag when it is  to  full. Wash your hands before and after touching the bag. Periodically check the tubing for kinks to make sure there is no pressure on the tubing which could restrict the flow of urine. Changing the Drainage Bags: Cleanse both ends of the clean bag with alcohol before changing. Pinch off the  rubber catheter to avoid urine spillage during the disconnection. Disconnect the dirty bag and connect the clean one. Empty the dirty bag carefully to avoid a urine spill. Attach the new bag to the leg with tape or a leg strap. Cleaning the Drainage Bags: Whenever a drainage bag is disconnected, it must be cleaned quickly so it is ready for the next use. Wash the bag in warm, soapy water. Rinse the bag thoroughly with warm water. Soak the bag for 30 minutes in a solution of white vinegar and water (1 cup vinegar to 1 quart warm water). Rinse with warm water. SEEK MEDICAL CARE IF:  Some pain develops in the kidney (lower back) area. The urine is cloudy or smells bad. There is some blood in the urine. The catheter becomes clogged and/or there is no urine drainage. SEEK IMMEDIATE MEDICAL CARE IF:  You have moderate or severe pain in the kidney region. You start to throw up (vomit). Blood fills the tube. Worsening belly (abdominal) pain develops. You have a fever. MAKE SURE YOU:  Understand these instructions. Will watch your condition. Will get help right away if you are not doing well or get worse.  Call [facility] if you have any questions or concerns: [phone]    

## 2019-02-03 NOTE — Progress Notes (Signed)
Pt discharged home with son in stable condition. Discharge instructions given. Script sent to pharmacy of choice. Foley care education provided. Pt verbalized understanding. No immediate questions or concerns at this time. Discharged from unit via wheelchair.

## 2019-02-03 NOTE — Discharge Summary (Signed)
Date of admission: 02/01/2019  Date of discharge: 02/03/2019  Admission diagnosis: peritoneal hemorrhage, urinary retention  Discharge diagnosis: same  Secondary diagnoses:  Patient Active Problem List   Diagnosis Date Noted  . Renal calculi 02/01/2019  . Anxiety as acute reaction to exceptional stress 07/20/2014  . Vitamin D deficiency 08/20/2012  . Tachycardia 11/22/2010  . Breast cancer, left breast ([place]) 10/14/2010    Procedures performed:   History and Physical: For full details, please see admission history and physical. Briefly, Alyssa Jackson is a 73 y.o. year old patient with history of right-sided ureteroscopy.  Her stent was removed 2 days prior to her presentation.  At the time of her presentation to the hospital she was noted to be in acute urinary retention.  A CT scan which was performed at the time demonstrated a right 6 mm UVJ stone as well as a left-sided perivesicle hematoma.Marland Kitchen   Hospital Course:  The patient was admitted for observation initially and then converted to inpatient as her length of stay warranted admission.  Her hospital course was largely unremarkable.  A Foley catheter was placed and she tolerated the catheter without any significant issues.  She was monitored for 48 hours because her hemoglobin drifted down from 12.7-9.1.  Fortunately, it returned up to 10.3 without any intervention.  Throughout the patient was asymptomatic with only mild left suprapubic tenderness.  The patient is being discharged home with a Foley catheter.   Laboratory values:  Recent Labs    02/01/19 1230  02/02/19 0527 02/02/19 1219 02/03/19 0543  WBC 9.3  --  6.1  --  7.1  HGB 12.7   < > 10.2* 9.1* 10.3*  HCT 37.3   < > 31.6* 28.1* 31.6*   < > = values in this interval not displayed.   Recent Labs    02/01/19 1555 02/02/19 0527 02/03/19 0543  NA 129* 134* 131*  K 4.4 3.8 4.0  CL 95* 105 100  CO2  --  21* 23  GLUCOSE 87 119* 127*  BUN 16 12 12   CREATININE  1.20* 1.03* 1.04*  CALCIUM  --  8.0* 8.3*   No results for input(s): LABPT, INR in the last 72 hours. No results for input(s): LABURIN in the last 72 hours. Results for orders placed or performed during the hospital encounter of 02/01/19  Urine culture     Status: None   Collection Time: 02/01/19 11:54 AM   Specimen: Urine, Random  Result Value Ref Range Status   Specimen Description   Final    URINE, RANDOM Performed at [facility] [address] [place], [place] [address]    Special Requests   Final    NONE Performed at [facility], [place] [address] [place], [place] [address]    Culture   Final    NO GROWTH Performed at [facility], [place] [address], [place], [place] [address]    Report Status 02/02/2019 FINAL  Final  SARS CORONAVIRUS 2 (TAT 6-24 HRS) Nasopharyngeal Nasopharyngeal Swab     Status: None   Collection Time: 02/01/19  7:53 PM   Specimen: Nasopharyngeal Swab  Result Value Ref Range Status   SARS Coronavirus 2 NEGATIVE NEGATIVE Final    Comment: (NOTE) SARS-CoV-2 target nucleic acids are NOT DETECTED. The SARS-CoV-2 RNA is generally detectable in upper and lower respiratory specimens during the acute phase of infection. Negative results do not preclude SARS-CoV-2 infection, do not rule out co-infections with other  pathogens, and should not be used as the sole basis for treatment or other patient management decisions. Negative results must be combined with clinical observations, patient history, and epidemiological information. The expected result is Negative. Fact Sheet for Patients: SugarRoll.be Fact Sheet for Healthcare Providers: https://www.woods-mathews.com/ This test is not yet approved or cleared by the [place] FDA and  has been authorized for detection and/or diagnosis of SARS-CoV-2 by FDA under an Emergency Use Authorization (EUA). This EUA will remain  in  effect (meaning this test can be used) for the duration of the COVID-19 declaration under Section 56 4(b)(1) of the Act, 21 U.S.C. section 360bbb-3(b)(1), unless the authorization is terminated or revoked sooner. Performed at [facility], [place] [address], [place], [place] [address]     Disposition: Home  Discharge instruction: The patient was instructed to be ambulatory but told to refrain from heavy lifting, strenuous activity, or driving.  Discharge medications:  Allergies as of 02/03/2019      Reactions   Carboplatin    Positive skin test documented.   Latex Other (See Comments)   Break out in blisters   Penicillins    Since childhood Has patient had a PCN reaction causing immediate rash, facial/tongue/throat swelling, SOB or lightheadedness with hypotension: Unknown Has patient had a PCN reaction causing severe rash involving mucus membranes or skin necrosis: Unknown Has patient had a PCN reaction that required hospitalization Unknown Has patient had a PCN reaction occurring within the last 10 years: No If all of the above answers are "NO", then may proceed with Cephalosporin use.   Sulfa Drugs Cross Reactors Nausea And Vomiting   Adhesive [tape]    Hydrocodone    Iodine Other (See Comments)   Gallbladder dye contrast tablets, pt states is not allergic to Iodine Has had other "dye" test with no problems.   Sulfa Antibiotics Rash      Medication List    STOP taking these medications   HYDROcodone-acetaminophen 5-325 MG tablet Commonly known as: Norco   ketorolac 10 MG tablet Commonly known as: TORADOL   ondansetron 4 MG tablet Commonly known as: Zofran   oxybutynin 5 MG tablet Commonly known as: DITROPAN     TAKE these medications   acetaminophen 650 MG CR tablet Commonly known as: TYLENOL Take 650-1,300 mg by mouth every 8 (eight) hours as needed for pain.   albuterol 108 (90 Base) MCG/ACT inhaler Commonly known as: VENTOLIN HFA Inhale 2 puffs  into the lungs as needed for wheezing.   aspirin 81 MG tablet Take 81 mg by mouth daily.   AZO TABS PO Take 2 tablets by mouth 3 (three) times daily. What changed: Another medication with the same name was removed. Continue taking this medication, and follow the directions you see here.   famotidine 20 MG tablet Commonly known as: PEPCID Take 20 mg by mouth daily as needed for heartburn or indigestion.   metFORMIN 1000 MG tablet Commonly known as: GLUCOPHAGE Take 1,000 mg by mouth 2 (two) times daily.   polyvinyl alcohol 1.4 % ophthalmic solution Commonly known as: LIQUIFILM TEARS Place 1 drop into both eyes as needed for dry eyes.   pravastatin 40 MG tablet Commonly known as: PRAVACHOL Take 40 mg by mouth daily.   quinapril 5 MG tablet Commonly known as: ACCUPRIL Take 5 mg by mouth daily.   silodosin 8 MG Caps capsule Commonly known as: RAPAFLO Take 1 capsule (8 mg total) by mouth at bedtime. Substitute for Tamsulosin (Flomax)  because of Sulfa allergy.   Synthroid 100 MCG tablet Generic drug: levothyroxine Take 100 mcg by mouth daily before breakfast.   Vitamin D3 125 MCG (5000 UT) Caps Take 5,000 Units by mouth daily.       Followup:  Follow-up Information    Ceasar Mons, MD. Schedule an appointment as soon as possible for a visit on 02/07/2019.   Specialty: Urology Contact information: [address] [place] [address] [phone]

## 2019-02-04 LAB — ABO/RH: ABO/RH(D): A POS

## 2019-04-05 ENCOUNTER — Ambulatory Visit: Payer: Medicare Other

## 2019-04-10 ENCOUNTER — Ambulatory Visit: Payer: Medicare Other

## 2019-04-13 ENCOUNTER — Ambulatory Visit: Payer: Medicare Other | Attending: Internal Medicine

## 2019-04-13 DIAGNOSIS — Z23 Encounter for immunization: Secondary | ICD-10-CM

## 2019-04-13 NOTE — Progress Notes (Signed)
   Covid-19 Vaccination Clinic  Name:  Alyssa Jackson    MRN: [id number] DOB: [birth year]  04/13/2019  Alyssa Jackson was observed post Covid-19 immunization for 30 minutes based on pre-vaccination screening without incidence. She was provided with Vaccine Information Sheet and instruction to access the V-Safe system.   Alyssa Jackson was instructed to call 911 with any severe reactions post vaccine: Marland Kitchen Difficulty breathing  . Swelling of your face and throat  . A fast heartbeat  . A bad rash all over your body  . Dizziness and weakness    Immunizations Administered    Name Date Dose VIS Date Route   Pfizer COVID-19 Vaccine 04/13/2019  2:34 PM 0.3 mL 02/15/2019 Intramuscular   Manufacturer: [place]   Lot: [id number]   [place]: [id number]

## 2019-05-08 ENCOUNTER — Ambulatory Visit: Payer: Medicare Other | Attending: Internal Medicine

## 2019-05-08 DIAGNOSIS — Z23 Encounter for immunization: Secondary | ICD-10-CM

## 2019-05-08 NOTE — Progress Notes (Signed)
   Covid-19 Vaccination Clinic  Name:  Alyssa Jackson    MRN: [id number] DOB: Mar 06, 1946  05/08/2019  [name] was observed post Covid-19 immunization for 15 minutes without incident. She was provided with Vaccine Information Sheet and instruction to access the V-Safe system.   [name] was instructed to call 911 with any severe reactions post vaccine: Marland Kitchen Difficulty breathing  . Swelling of face and throat  . A fast heartbeat  . A bad rash all over body  . Dizziness and weakness   Immunizations Administered    Name Date Dose VIS Date Route   Pfizer COVID-19 Vaccine 05/08/2019 10:28 AM 0.3 mL 02/15/2019 Intramuscular   Manufacturer: [place]   Lot: [id number]   [place]: [id number]

## 2019-08-19 ENCOUNTER — Other Ambulatory Visit: Payer: Self-pay | Admitting: Family Medicine

## 2019-08-19 DIAGNOSIS — Z1231 Encounter for screening mammogram for malignant neoplasm of breast: Secondary | ICD-10-CM

## 2019-08-19 DIAGNOSIS — M8580 Other specified disorders of bone density and structure, unspecified site: Secondary | ICD-10-CM

## 2019-12-23 ENCOUNTER — Ambulatory Visit: Payer: Medicare Other

## 2019-12-23 ENCOUNTER — Ambulatory Visit
Admission: RE | Admit: 2019-12-23 | Discharge: 2019-12-23 | Disposition: A | Discharge status: Home / Self Care | Payer: Medicare Other | Source: Ambulatory Visit | Attending: Family Medicine | Admitting: Family Medicine

## 2019-12-23 ENCOUNTER — Other Ambulatory Visit: Payer: Self-pay

## 2019-12-23 DIAGNOSIS — M8580 Other specified disorders of bone density and structure, unspecified site: Secondary | ICD-10-CM

## 2020-01-15 ENCOUNTER — Ambulatory Visit: Payer: Medicare Other

## 2020-01-22 ENCOUNTER — Ambulatory Visit
Admission: RE | Admit: 2020-01-22 | Discharge: 2020-01-22 | Disposition: A | Discharge status: Home / Self Care | Payer: Medicare Other | Source: Ambulatory Visit | Attending: Family Medicine | Admitting: Family Medicine

## 2020-01-22 ENCOUNTER — Other Ambulatory Visit: Payer: Self-pay

## 2020-01-22 DIAGNOSIS — Z1231 Encounter for screening mammogram for malignant neoplasm of breast: Secondary | ICD-10-CM

## 2020-04-06 DIAGNOSIS — E1169 Type 2 diabetes mellitus with other specified complication: Secondary | ICD-10-CM | POA: Diagnosis not present

## 2020-04-06 DIAGNOSIS — E039 Hypothyroidism, unspecified: Secondary | ICD-10-CM | POA: Diagnosis not present

## 2020-04-06 DIAGNOSIS — M810 Age-related osteoporosis without current pathological fracture: Secondary | ICD-10-CM | POA: Diagnosis not present

## 2020-04-06 DIAGNOSIS — I10 Essential (primary) hypertension: Secondary | ICD-10-CM | POA: Diagnosis not present

## 2020-04-06 DIAGNOSIS — E119 Type 2 diabetes mellitus without complications: Secondary | ICD-10-CM | POA: Diagnosis not present

## 2020-04-06 DIAGNOSIS — M8580 Other specified disorders of bone density and structure, unspecified site: Secondary | ICD-10-CM | POA: Diagnosis not present

## 2020-04-06 DIAGNOSIS — E782 Mixed hyperlipidemia: Secondary | ICD-10-CM | POA: Diagnosis not present

## 2020-04-06 DIAGNOSIS — J4520 Mild intermittent asthma, uncomplicated: Secondary | ICD-10-CM | POA: Diagnosis not present

## 2020-04-06 DIAGNOSIS — K219 Gastro-esophageal reflux disease without esophagitis: Secondary | ICD-10-CM | POA: Diagnosis not present

## 2020-04-10 DIAGNOSIS — N390 Urinary tract infection, site not specified: Secondary | ICD-10-CM | POA: Diagnosis not present

## 2020-04-15 DIAGNOSIS — E039 Hypothyroidism, unspecified: Secondary | ICD-10-CM | POA: Diagnosis not present

## 2020-04-15 DIAGNOSIS — E559 Vitamin D deficiency, unspecified: Secondary | ICD-10-CM | POA: Diagnosis not present

## 2020-04-15 DIAGNOSIS — E1169 Type 2 diabetes mellitus with other specified complication: Secondary | ICD-10-CM | POA: Diagnosis not present

## 2020-04-15 DIAGNOSIS — R7989 Other specified abnormal findings of blood chemistry: Secondary | ICD-10-CM | POA: Diagnosis not present

## 2020-04-15 DIAGNOSIS — M8580 Other specified disorders of bone density and structure, unspecified site: Secondary | ICD-10-CM | POA: Diagnosis not present

## 2020-04-15 DIAGNOSIS — I10 Essential (primary) hypertension: Secondary | ICD-10-CM | POA: Diagnosis not present

## 2020-04-15 DIAGNOSIS — E782 Mixed hyperlipidemia: Secondary | ICD-10-CM | POA: Diagnosis not present

## 2020-04-23 DIAGNOSIS — R8290 Unspecified abnormal findings in urine: Secondary | ICD-10-CM | POA: Diagnosis not present

## 2020-04-23 DIAGNOSIS — R7989 Other specified abnormal findings of blood chemistry: Secondary | ICD-10-CM | POA: Diagnosis not present

## 2020-04-23 DIAGNOSIS — I10 Essential (primary) hypertension: Secondary | ICD-10-CM | POA: Diagnosis not present

## 2020-04-23 DIAGNOSIS — M8580 Other specified disorders of bone density and structure, unspecified site: Secondary | ICD-10-CM | POA: Diagnosis not present

## 2020-04-23 DIAGNOSIS — Z7984 Long term (current) use of oral hypoglycemic drugs: Secondary | ICD-10-CM | POA: Diagnosis not present

## 2020-04-23 DIAGNOSIS — E039 Hypothyroidism, unspecified: Secondary | ICD-10-CM | POA: Diagnosis not present

## 2020-04-23 DIAGNOSIS — E782 Mixed hyperlipidemia: Secondary | ICD-10-CM | POA: Diagnosis not present

## 2020-04-23 DIAGNOSIS — E559 Vitamin D deficiency, unspecified: Secondary | ICD-10-CM | POA: Diagnosis not present

## 2020-04-23 DIAGNOSIS — E1169 Type 2 diabetes mellitus with other specified complication: Secondary | ICD-10-CM | POA: Diagnosis not present

## 2020-05-14 DIAGNOSIS — M8580 Other specified disorders of bone density and structure, unspecified site: Secondary | ICD-10-CM | POA: Diagnosis not present

## 2020-05-14 DIAGNOSIS — M810 Age-related osteoporosis without current pathological fracture: Secondary | ICD-10-CM | POA: Diagnosis not present

## 2020-05-14 DIAGNOSIS — J4520 Mild intermittent asthma, uncomplicated: Secondary | ICD-10-CM | POA: Diagnosis not present

## 2020-05-14 DIAGNOSIS — E039 Hypothyroidism, unspecified: Secondary | ICD-10-CM | POA: Diagnosis not present

## 2020-05-14 DIAGNOSIS — E1169 Type 2 diabetes mellitus with other specified complication: Secondary | ICD-10-CM | POA: Diagnosis not present

## 2020-05-14 DIAGNOSIS — E782 Mixed hyperlipidemia: Secondary | ICD-10-CM | POA: Diagnosis not present

## 2020-05-14 DIAGNOSIS — K219 Gastro-esophageal reflux disease without esophagitis: Secondary | ICD-10-CM | POA: Diagnosis not present

## 2020-05-14 DIAGNOSIS — I10 Essential (primary) hypertension: Secondary | ICD-10-CM | POA: Diagnosis not present

## 2020-06-04 DIAGNOSIS — E039 Hypothyroidism, unspecified: Secondary | ICD-10-CM | POA: Diagnosis not present

## 2020-06-04 DIAGNOSIS — I10 Essential (primary) hypertension: Secondary | ICD-10-CM | POA: Diagnosis not present

## 2020-06-04 DIAGNOSIS — E559 Vitamin D deficiency, unspecified: Secondary | ICD-10-CM | POA: Diagnosis not present

## 2020-06-04 DIAGNOSIS — M8580 Other specified disorders of bone density and structure, unspecified site: Secondary | ICD-10-CM | POA: Diagnosis not present

## 2020-06-04 DIAGNOSIS — R8290 Unspecified abnormal findings in urine: Secondary | ICD-10-CM | POA: Diagnosis not present

## 2020-07-01 DIAGNOSIS — E039 Hypothyroidism, unspecified: Secondary | ICD-10-CM | POA: Diagnosis not present

## 2020-07-01 DIAGNOSIS — J4520 Mild intermittent asthma, uncomplicated: Secondary | ICD-10-CM | POA: Diagnosis not present

## 2020-07-01 DIAGNOSIS — K219 Gastro-esophageal reflux disease without esophagitis: Secondary | ICD-10-CM | POA: Diagnosis not present

## 2020-07-01 DIAGNOSIS — E119 Type 2 diabetes mellitus without complications: Secondary | ICD-10-CM | POA: Diagnosis not present

## 2020-07-01 DIAGNOSIS — E782 Mixed hyperlipidemia: Secondary | ICD-10-CM | POA: Diagnosis not present

## 2020-07-01 DIAGNOSIS — M810 Age-related osteoporosis without current pathological fracture: Secondary | ICD-10-CM | POA: Diagnosis not present

## 2020-07-01 DIAGNOSIS — E1169 Type 2 diabetes mellitus with other specified complication: Secondary | ICD-10-CM | POA: Diagnosis not present

## 2020-07-01 DIAGNOSIS — I10 Essential (primary) hypertension: Secondary | ICD-10-CM | POA: Diagnosis not present

## 2020-07-01 DIAGNOSIS — M8580 Other specified disorders of bone density and structure, unspecified site: Secondary | ICD-10-CM | POA: Diagnosis not present

## 2020-07-07 DIAGNOSIS — E119 Type 2 diabetes mellitus without complications: Secondary | ICD-10-CM | POA: Diagnosis not present

## 2020-07-07 DIAGNOSIS — Z961 Presence of intraocular lens: Secondary | ICD-10-CM | POA: Diagnosis not present

## 2020-07-07 DIAGNOSIS — H43393 Other vitreous opacities, bilateral: Secondary | ICD-10-CM | POA: Diagnosis not present

## 2020-07-07 DIAGNOSIS — H04123 Dry eye syndrome of bilateral lacrimal glands: Secondary | ICD-10-CM | POA: Diagnosis not present

## 2020-08-10 ENCOUNTER — Encounter: Payer: Self-pay | Admitting: Dermatology

## 2020-08-10 ENCOUNTER — Ambulatory Visit: Payer: Medicare Other | Admitting: Dermatology

## 2020-08-10 ENCOUNTER — Other Ambulatory Visit: Payer: Self-pay

## 2020-08-10 DIAGNOSIS — Z1283 Encounter for screening for malignant neoplasm of skin: Secondary | ICD-10-CM | POA: Diagnosis not present

## 2020-08-10 DIAGNOSIS — L570 Actinic keratosis: Secondary | ICD-10-CM | POA: Diagnosis not present

## 2020-08-10 DIAGNOSIS — D485 Neoplasm of uncertain behavior of skin: Secondary | ICD-10-CM | POA: Diagnosis not present

## 2020-08-10 NOTE — Patient Instructions (Signed)

## 2020-08-14 ENCOUNTER — Encounter: Payer: Self-pay | Admitting: Dermatology

## 2020-08-14 NOTE — Progress Notes (Signed)
Follow-Up Visit   Subjective  Alyssa Jackson is a 75 y.o. female who presents for the following: New Patient (Initial Visit) (Patient here today for full body skin check. Per patient she has a lesion on there right neck x 3 months no bleeding, no pain. Also check lesion on right ear rim x months crusty, bleeding when scratching. No personal history or family history of atypical moles, melanoma or non mole skin cancer. ).  General skin examination, new tender crust from neck Location:  Duration:  Quality:  Associated Signs/Symptoms: Modifying Factors:  Severity:  Timing: Context:   Objective  Well appearing patient in no apparent distress; mood and affect are within normal limits. Right Supraclavicular Area Hornlike 6 mm crust lower anterior neck.       Right Superior Helix 3 mm gritty pink crust  Right Upper Back General skin examination.  No atypical pigmented lesions.  1 possible nonmelanoma skin cancer on lower front neck will be biopsied and treated.    A full examination was performed including scalp, head, eyes, ears, nose, lips, neck, chest, axillae, abdomen, back, buttocks, bilateral upper extremities, bilateral lower extremities, hands, feet, fingers, toes, fingernails, and toenails. All findings within normal limits unless otherwise noted below.  Areas beneath undergarments not fully examined.   Assessment & Plan    Neoplasm of uncertain behavior of skin Right Supraclavicular Area  Skin / nail biopsy Type of biopsy: tangential   Informed consent: discussed and consent obtained   Timeout: patient name, date of birth, surgical site, and procedure verified   Anesthesia: the lesion was anesthetized in a standard fashion   Anesthetic:  1% lidocaine w/ epinephrine 1-100,000 local infiltration Instrument used: flexible razor blade   Hemostasis achieved with: aluminum chloride and electrodesiccation   Outcome: patient tolerated procedure well   Post-procedure  details: wound care instructions given    Destruction of lesion Complexity: simple   Destruction method: electrodesiccation and curettage   Informed consent: discussed and consent obtained   Timeout:  patient name, date of birth, surgical site, and procedure verified Anesthesia: the lesion was anesthetized in a standard fashion   Anesthetic:  1% lidocaine w/ epinephrine 1-100,000 local infiltration Curettage performed in three different directions: Yes   Electrodesiccation performed over the curetted area: Yes   Curettage cycles:  1 Margin per side (cm):  0.1 Final wound size (cm):  0.3 Hemostasis achieved with:  ferric subsulfate Outcome: patient tolerated procedure well with no complications   Post-procedure details: sterile dressing applied and wound care instructions given   Dressing type: petrolatum    Specimen 1 - Surgical pathology Differential Diagnosis: R/O BCC vs SCC vs Wart - treated after biopsy  Check Margins: No  After shave biopsy the base of the lesion was treated with curettage plus cautery.  AK (actinic keratosis) Right Superior Helix  Destruction of lesion - Right Superior Helix Complexity: simple   Destruction method: cryotherapy   Informed consent: discussed and consent obtained   Timeout:  patient name, date of birth, surgical site, and procedure verified Lesion destroyed using liquid nitrogen: Yes   Cryotherapy cycles:  3 Outcome: patient tolerated procedure well with no complications   Post-procedure details: wound care instructions given    Encounter for screening for malignant neoplasm of skin Right Upper Back  Annual skin examination.  Patient encouraged to examine her own skin twice annually.      I, Lavonna Monarch, MD, have reviewed all documentation for this visit.  The documentation  on 08/14/20 for the exam, diagnosis, procedures, and orders are all accurate and complete.

## 2020-08-20 ENCOUNTER — Telehealth: Payer: Self-pay | Admitting: Dermatology

## 2020-08-20 NOTE — Telephone Encounter (Signed)
Patient LM on our VM asking for a return call with biopsy results.

## 2020-08-24 ENCOUNTER — Telehealth: Payer: Self-pay | Admitting: Dermatology

## 2020-08-24 NOTE — Telephone Encounter (Signed)
results

## 2020-08-24 NOTE — Telephone Encounter (Signed)
Phone call from patient returning our call for her pathology results. Path to patient.

## 2020-08-24 NOTE — Telephone Encounter (Signed)
Phone call to patient with her pathology results. Voicemail left for patient to give the office a call back.  

## 2020-10-22 DIAGNOSIS — N200 Calculus of kidney: Secondary | ICD-10-CM | POA: Diagnosis not present

## 2020-10-22 DIAGNOSIS — N3020 Other chronic cystitis without hematuria: Secondary | ICD-10-CM | POA: Diagnosis not present

## 2020-11-25 DIAGNOSIS — J4520 Mild intermittent asthma, uncomplicated: Secondary | ICD-10-CM | POA: Diagnosis not present

## 2020-11-25 DIAGNOSIS — M810 Age-related osteoporosis without current pathological fracture: Secondary | ICD-10-CM | POA: Diagnosis not present

## 2020-11-25 DIAGNOSIS — E119 Type 2 diabetes mellitus without complications: Secondary | ICD-10-CM | POA: Diagnosis not present

## 2020-11-25 DIAGNOSIS — I10 Essential (primary) hypertension: Secondary | ICD-10-CM | POA: Diagnosis not present

## 2020-11-25 DIAGNOSIS — M8580 Other specified disorders of bone density and structure, unspecified site: Secondary | ICD-10-CM | POA: Diagnosis not present

## 2020-11-25 DIAGNOSIS — E039 Hypothyroidism, unspecified: Secondary | ICD-10-CM | POA: Diagnosis not present

## 2020-11-25 DIAGNOSIS — E1169 Type 2 diabetes mellitus with other specified complication: Secondary | ICD-10-CM | POA: Diagnosis not present

## 2020-11-25 DIAGNOSIS — K219 Gastro-esophageal reflux disease without esophagitis: Secondary | ICD-10-CM | POA: Diagnosis not present

## 2020-11-25 DIAGNOSIS — E782 Mixed hyperlipidemia: Secondary | ICD-10-CM | POA: Diagnosis not present

## 2020-12-16 DIAGNOSIS — E039 Hypothyroidism, unspecified: Secondary | ICD-10-CM | POA: Diagnosis not present

## 2020-12-16 DIAGNOSIS — I10 Essential (primary) hypertension: Secondary | ICD-10-CM | POA: Diagnosis not present

## 2020-12-16 DIAGNOSIS — E559 Vitamin D deficiency, unspecified: Secondary | ICD-10-CM | POA: Diagnosis not present

## 2020-12-16 DIAGNOSIS — E1169 Type 2 diabetes mellitus with other specified complication: Secondary | ICD-10-CM | POA: Diagnosis not present

## 2020-12-16 DIAGNOSIS — E782 Mixed hyperlipidemia: Secondary | ICD-10-CM | POA: Diagnosis not present

## 2020-12-21 ENCOUNTER — Other Ambulatory Visit: Payer: Self-pay | Admitting: Family Medicine

## 2020-12-21 DIAGNOSIS — Z9012 Acquired absence of left breast and nipple: Secondary | ICD-10-CM

## 2020-12-21 DIAGNOSIS — Z1231 Encounter for screening mammogram for malignant neoplasm of breast: Secondary | ICD-10-CM

## 2020-12-22 DIAGNOSIS — E119 Type 2 diabetes mellitus without complications: Secondary | ICD-10-CM | POA: Diagnosis not present

## 2020-12-22 DIAGNOSIS — E039 Hypothyroidism, unspecified: Secondary | ICD-10-CM | POA: Diagnosis not present

## 2020-12-22 DIAGNOSIS — I10 Essential (primary) hypertension: Secondary | ICD-10-CM | POA: Diagnosis not present

## 2020-12-22 DIAGNOSIS — E782 Mixed hyperlipidemia: Secondary | ICD-10-CM | POA: Diagnosis not present

## 2020-12-22 DIAGNOSIS — Z20822 Contact with and (suspected) exposure to covid-19: Secondary | ICD-10-CM | POA: Diagnosis not present

## 2020-12-22 DIAGNOSIS — K219 Gastro-esophageal reflux disease without esophagitis: Secondary | ICD-10-CM | POA: Diagnosis not present

## 2020-12-22 DIAGNOSIS — J4520 Mild intermittent asthma, uncomplicated: Secondary | ICD-10-CM | POA: Diagnosis not present

## 2020-12-22 DIAGNOSIS — Z0000 Encounter for general adult medical examination without abnormal findings: Secondary | ICD-10-CM | POA: Diagnosis not present

## 2020-12-22 DIAGNOSIS — E559 Vitamin D deficiency, unspecified: Secondary | ICD-10-CM | POA: Diagnosis not present

## 2020-12-22 DIAGNOSIS — R8290 Unspecified abnormal findings in urine: Secondary | ICD-10-CM | POA: Diagnosis not present

## 2020-12-28 DIAGNOSIS — M8580 Other specified disorders of bone density and structure, unspecified site: Secondary | ICD-10-CM | POA: Diagnosis not present

## 2020-12-28 DIAGNOSIS — J4520 Mild intermittent asthma, uncomplicated: Secondary | ICD-10-CM | POA: Diagnosis not present

## 2020-12-28 DIAGNOSIS — E039 Hypothyroidism, unspecified: Secondary | ICD-10-CM | POA: Diagnosis not present

## 2020-12-28 DIAGNOSIS — E119 Type 2 diabetes mellitus without complications: Secondary | ICD-10-CM | POA: Diagnosis not present

## 2020-12-28 DIAGNOSIS — I10 Essential (primary) hypertension: Secondary | ICD-10-CM | POA: Diagnosis not present

## 2020-12-28 DIAGNOSIS — M810 Age-related osteoporosis without current pathological fracture: Secondary | ICD-10-CM | POA: Diagnosis not present

## 2020-12-28 DIAGNOSIS — E1169 Type 2 diabetes mellitus with other specified complication: Secondary | ICD-10-CM | POA: Diagnosis not present

## 2020-12-28 DIAGNOSIS — K219 Gastro-esophageal reflux disease without esophagitis: Secondary | ICD-10-CM | POA: Diagnosis not present

## 2020-12-28 DIAGNOSIS — E782 Mixed hyperlipidemia: Secondary | ICD-10-CM | POA: Diagnosis not present

## 2021-01-11 DIAGNOSIS — Z9012 Acquired absence of left breast and nipple: Secondary | ICD-10-CM | POA: Diagnosis not present

## 2021-01-14 DIAGNOSIS — R8290 Unspecified abnormal findings in urine: Secondary | ICD-10-CM | POA: Diagnosis not present

## 2021-01-22 ENCOUNTER — Ambulatory Visit
Admission: RE | Admit: 2021-01-22 | Discharge: 2021-01-22 | Disposition: A | Discharge status: Home / Self Care | Payer: Medicare Other | Source: Ambulatory Visit | Attending: Family Medicine | Admitting: Family Medicine

## 2021-01-22 DIAGNOSIS — Z1231 Encounter for screening mammogram for malignant neoplasm of breast: Secondary | ICD-10-CM | POA: Diagnosis not present

## 2021-01-22 DIAGNOSIS — Z9012 Acquired absence of left breast and nipple: Secondary | ICD-10-CM

## 2021-01-27 DIAGNOSIS — Z9012 Acquired absence of left breast and nipple: Secondary | ICD-10-CM | POA: Diagnosis not present

## 2021-02-02 DIAGNOSIS — M8580 Other specified disorders of bone density and structure, unspecified site: Secondary | ICD-10-CM | POA: Diagnosis not present

## 2021-02-02 DIAGNOSIS — E1169 Type 2 diabetes mellitus with other specified complication: Secondary | ICD-10-CM | POA: Diagnosis not present

## 2021-02-02 DIAGNOSIS — E782 Mixed hyperlipidemia: Secondary | ICD-10-CM | POA: Diagnosis not present

## 2021-02-02 DIAGNOSIS — J4520 Mild intermittent asthma, uncomplicated: Secondary | ICD-10-CM | POA: Diagnosis not present

## 2021-02-02 DIAGNOSIS — M810 Age-related osteoporosis without current pathological fracture: Secondary | ICD-10-CM | POA: Diagnosis not present

## 2021-02-02 DIAGNOSIS — K219 Gastro-esophageal reflux disease without esophagitis: Secondary | ICD-10-CM | POA: Diagnosis not present

## 2021-02-02 DIAGNOSIS — I10 Essential (primary) hypertension: Secondary | ICD-10-CM | POA: Diagnosis not present

## 2021-02-02 DIAGNOSIS — E039 Hypothyroidism, unspecified: Secondary | ICD-10-CM | POA: Diagnosis not present

## 2021-02-02 DIAGNOSIS — E119 Type 2 diabetes mellitus without complications: Secondary | ICD-10-CM | POA: Diagnosis not present

## 2021-05-14 DIAGNOSIS — H6123 Impacted cerumen, bilateral: Secondary | ICD-10-CM | POA: Diagnosis not present

## 2021-07-05 IMAGING — MG DIGITAL SCREENING UNILAT RIGHT W/ TOMO W/ CAD
4 series · 4 of 12 positions shown · non-contrast
Comparison: Previous exam(s).

CLINICAL DATA: Screening.

EXAM:
DIGITAL SCREENING UNILATERAL RIGHT MAMMOGRAM WITH CAD AND TOMO

[R MLO synth-2D]
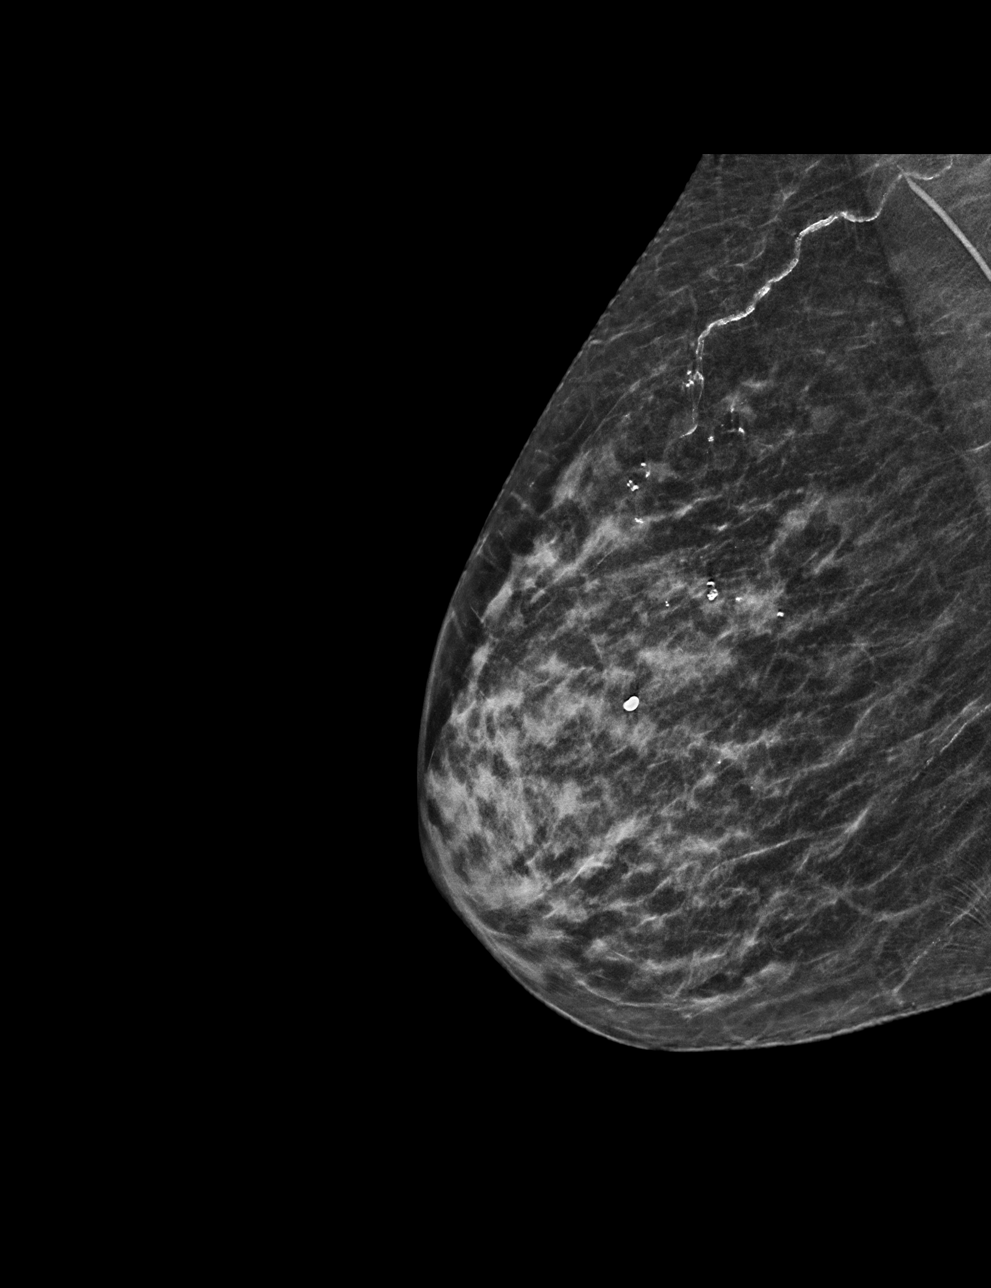

[R CC synth-2D]
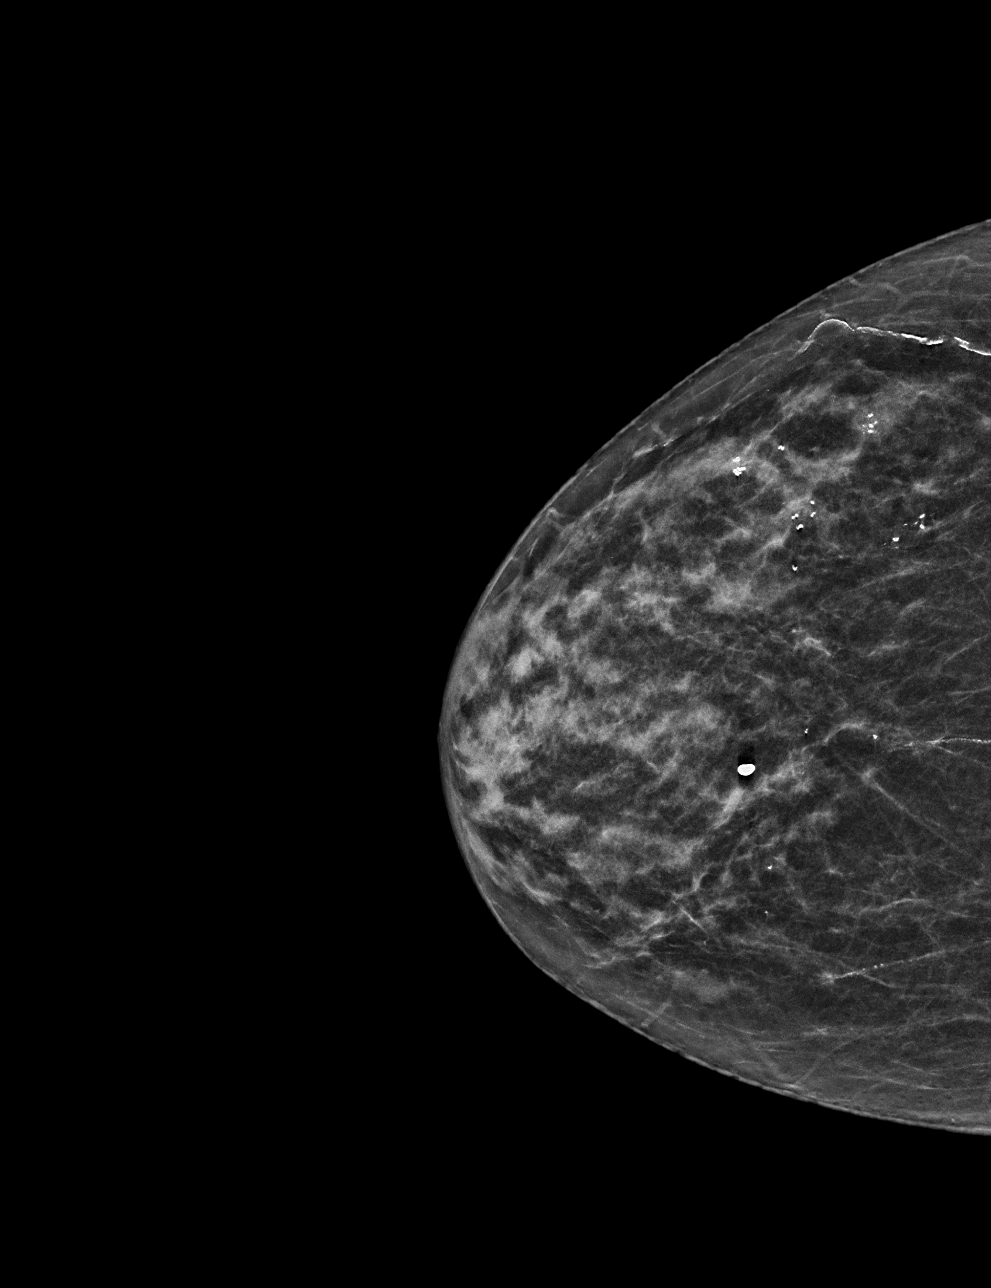

[R MLO tomo · tomo slice 24/47.0]
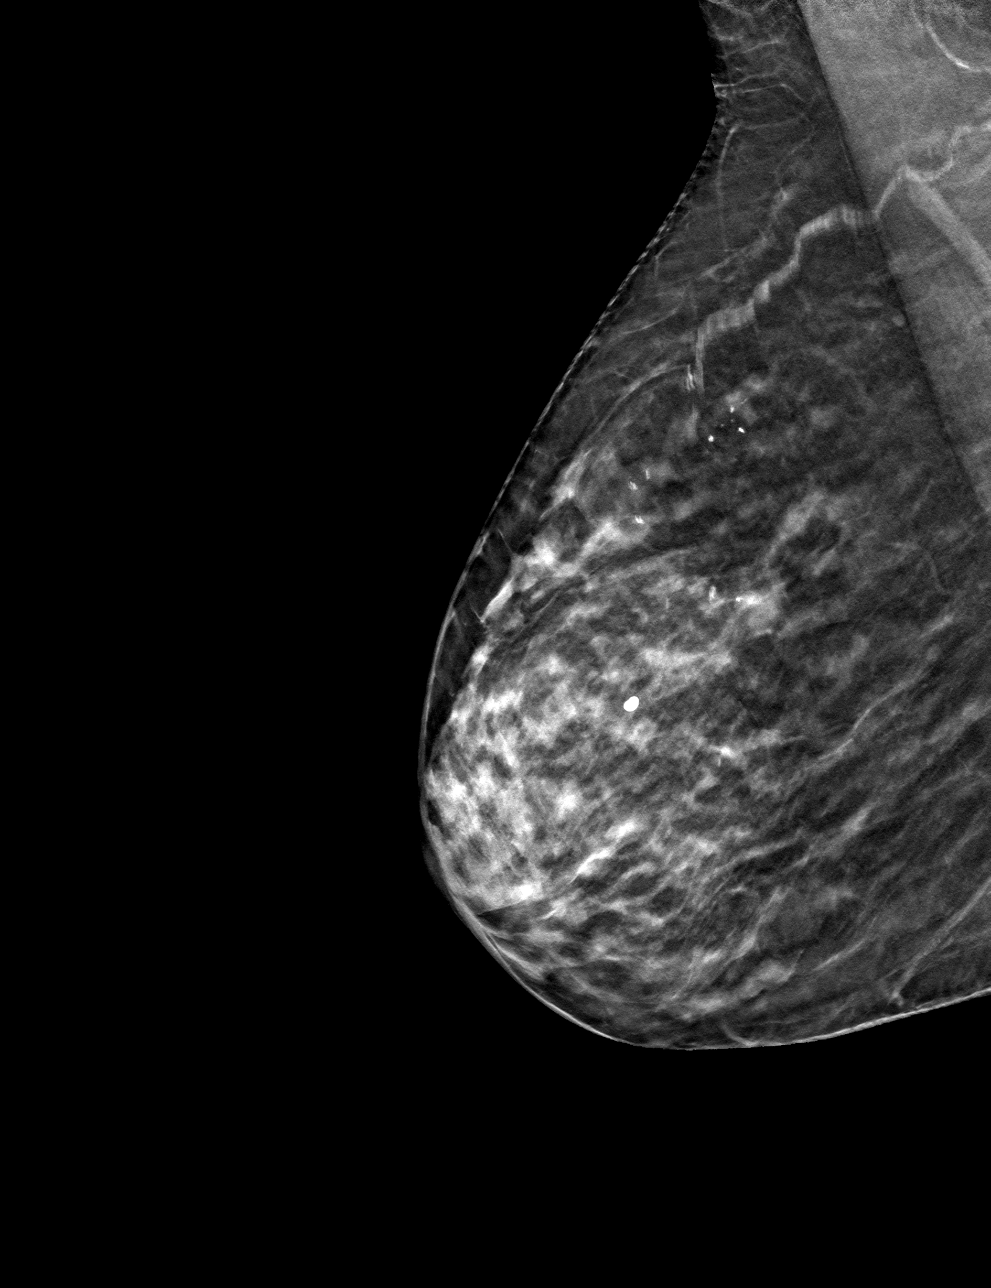

[R CC tomo · tomo slice 23/44.0]
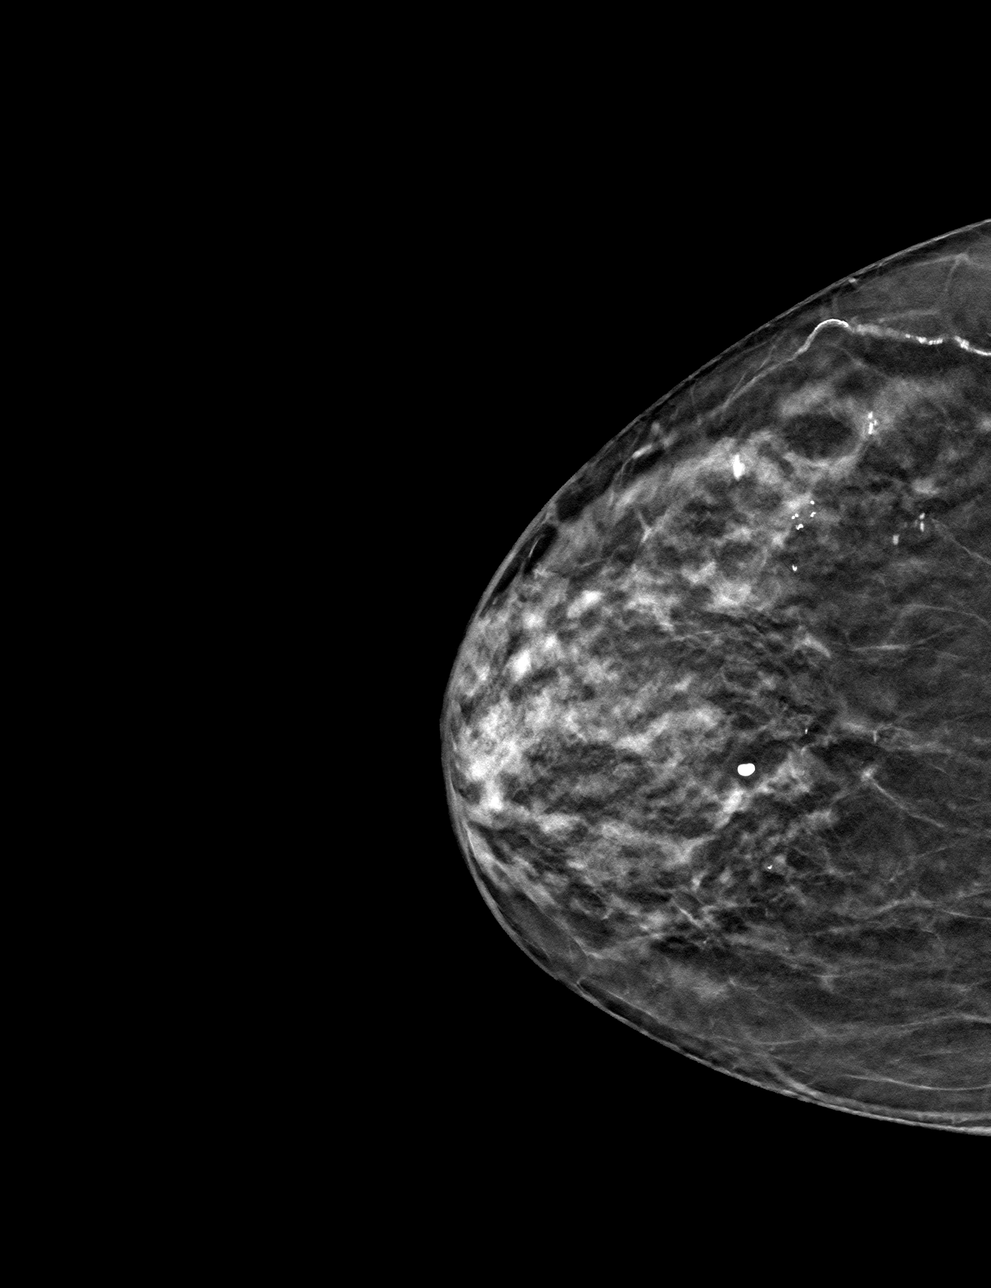

[4 of 12 positions shown; findings below may reference images not displayed]

ACR Breast Density Category c: The breast tissue is heterogeneously
dense, which may obscure small masses.
FINDINGS: The patient has had a left mastectomy. There are no findings
suspicious for malignancy. Images were processed with CAD.
IMPRESSION: No mammographic evidence of malignancy. A result letter of this
screening mammogram will be mailed directly to the patient.

RECOMMENDATION:
Screening mammogram in one year.  (Code:CY-P-D4Y)

BI-RADS CATEGORY  1: Negative.

## 2021-07-08 DIAGNOSIS — H1013 Acute atopic conjunctivitis, bilateral: Secondary | ICD-10-CM | POA: Diagnosis not present

## 2021-07-08 DIAGNOSIS — H0102B Squamous blepharitis left eye, upper and lower eyelids: Secondary | ICD-10-CM | POA: Diagnosis not present

## 2021-07-08 DIAGNOSIS — H43393 Other vitreous opacities, bilateral: Secondary | ICD-10-CM | POA: Diagnosis not present

## 2021-07-08 DIAGNOSIS — H0102A Squamous blepharitis right eye, upper and lower eyelids: Secondary | ICD-10-CM | POA: Diagnosis not present

## 2021-07-08 DIAGNOSIS — H04123 Dry eye syndrome of bilateral lacrimal glands: Secondary | ICD-10-CM | POA: Diagnosis not present

## 2021-07-08 DIAGNOSIS — E119 Type 2 diabetes mellitus without complications: Secondary | ICD-10-CM | POA: Diagnosis not present

## 2021-07-08 DIAGNOSIS — Z961 Presence of intraocular lens: Secondary | ICD-10-CM | POA: Diagnosis not present

## 2021-08-10 ENCOUNTER — Ambulatory Visit: Payer: Medicare Other | Admitting: Dermatology

## 2021-10-20 ENCOUNTER — Ambulatory Visit: Payer: Medicare Other | Admitting: Dermatology

## 2021-12-24 ENCOUNTER — Other Ambulatory Visit: Payer: Self-pay | Admitting: Family Medicine

## 2021-12-24 DIAGNOSIS — M8580 Other specified disorders of bone density and structure, unspecified site: Secondary | ICD-10-CM

## 2021-12-24 DIAGNOSIS — Z1231 Encounter for screening mammogram for malignant neoplasm of breast: Secondary | ICD-10-CM

## 2021-12-30 ENCOUNTER — Ambulatory Visit
Admission: RE | Admit: 2021-12-30 | Discharge: 2021-12-30 | Disposition: A | Discharge status: Home / Self Care | Payer: Medicare Other | Source: Ambulatory Visit | Attending: Family Medicine | Admitting: Family Medicine

## 2021-12-30 DIAGNOSIS — M8580 Other specified disorders of bone density and structure, unspecified site: Secondary | ICD-10-CM

## 2022-01-10 DIAGNOSIS — M9904 Segmental and somatic dysfunction of sacral region: Secondary | ICD-10-CM | POA: Diagnosis not present

## 2022-01-10 DIAGNOSIS — M9901 Segmental and somatic dysfunction of cervical region: Secondary | ICD-10-CM | POA: Diagnosis not present

## 2022-01-10 DIAGNOSIS — M9905 Segmental and somatic dysfunction of pelvic region: Secondary | ICD-10-CM | POA: Diagnosis not present

## 2022-01-10 DIAGNOSIS — M9902 Segmental and somatic dysfunction of thoracic region: Secondary | ICD-10-CM | POA: Diagnosis not present

## 2022-01-17 DIAGNOSIS — M9903 Segmental and somatic dysfunction of lumbar region: Secondary | ICD-10-CM | POA: Diagnosis not present

## 2022-01-17 DIAGNOSIS — M9904 Segmental and somatic dysfunction of sacral region: Secondary | ICD-10-CM | POA: Diagnosis not present

## 2022-01-17 DIAGNOSIS — M9901 Segmental and somatic dysfunction of cervical region: Secondary | ICD-10-CM | POA: Diagnosis not present

## 2022-01-17 DIAGNOSIS — M9902 Segmental and somatic dysfunction of thoracic region: Secondary | ICD-10-CM | POA: Diagnosis not present

## 2022-01-17 DIAGNOSIS — M9905 Segmental and somatic dysfunction of pelvic region: Secondary | ICD-10-CM | POA: Diagnosis not present

## 2022-01-24 DIAGNOSIS — M9904 Segmental and somatic dysfunction of sacral region: Secondary | ICD-10-CM | POA: Diagnosis not present

## 2022-01-24 DIAGNOSIS — M9905 Segmental and somatic dysfunction of pelvic region: Secondary | ICD-10-CM | POA: Diagnosis not present

## 2022-01-24 DIAGNOSIS — M9901 Segmental and somatic dysfunction of cervical region: Secondary | ICD-10-CM | POA: Diagnosis not present

## 2022-01-24 DIAGNOSIS — M9902 Segmental and somatic dysfunction of thoracic region: Secondary | ICD-10-CM | POA: Diagnosis not present

## 2022-02-21 DIAGNOSIS — U071 COVID-19: Secondary | ICD-10-CM | POA: Diagnosis not present

## 2022-02-22 ENCOUNTER — Ambulatory Visit: Payer: Medicare Other

## 2022-04-15 ENCOUNTER — Ambulatory Visit: Payer: Medicare Other

## 2022-04-19 ENCOUNTER — Ambulatory Visit
Admission: RE | Admit: 2022-04-19 | Discharge: 2022-04-19 | Disposition: A | Discharge status: Home / Self Care | Payer: Medicare Other | Source: Ambulatory Visit | Attending: Family Medicine | Admitting: Family Medicine

## 2022-04-19 DIAGNOSIS — Z1231 Encounter for screening mammogram for malignant neoplasm of breast: Secondary | ICD-10-CM | POA: Diagnosis not present

## 2022-04-21 DIAGNOSIS — E039 Hypothyroidism, unspecified: Secondary | ICD-10-CM | POA: Diagnosis not present

## 2022-04-21 DIAGNOSIS — E119 Type 2 diabetes mellitus without complications: Secondary | ICD-10-CM | POA: Diagnosis not present

## 2022-04-22 DIAGNOSIS — E119 Type 2 diabetes mellitus without complications: Secondary | ICD-10-CM | POA: Diagnosis not present

## 2022-07-14 DIAGNOSIS — H0102B Squamous blepharitis left eye, upper and lower eyelids: Secondary | ICD-10-CM | POA: Diagnosis not present

## 2022-07-14 DIAGNOSIS — H1045 Other chronic allergic conjunctivitis: Secondary | ICD-10-CM | POA: Diagnosis not present

## 2022-07-14 DIAGNOSIS — E119 Type 2 diabetes mellitus without complications: Secondary | ICD-10-CM | POA: Diagnosis not present

## 2022-07-14 DIAGNOSIS — Z961 Presence of intraocular lens: Secondary | ICD-10-CM | POA: Diagnosis not present

## 2022-07-14 DIAGNOSIS — H0102A Squamous blepharitis right eye, upper and lower eyelids: Secondary | ICD-10-CM | POA: Diagnosis not present

## 2022-07-14 DIAGNOSIS — H04123 Dry eye syndrome of bilateral lacrimal glands: Secondary | ICD-10-CM | POA: Diagnosis not present

## 2022-07-14 DIAGNOSIS — H43393 Other vitreous opacities, bilateral: Secondary | ICD-10-CM | POA: Diagnosis not present

## 2022-08-25 DIAGNOSIS — H6120 Impacted cerumen, unspecified ear: Secondary | ICD-10-CM | POA: Diagnosis not present

## 2022-09-27 DIAGNOSIS — L820 Inflamed seborrheic keratosis: Secondary | ICD-10-CM | POA: Diagnosis not present

## 2022-09-27 DIAGNOSIS — L603 Nail dystrophy: Secondary | ICD-10-CM | POA: Diagnosis not present

## 2022-09-27 DIAGNOSIS — L853 Xerosis cutis: Secondary | ICD-10-CM | POA: Diagnosis not present

## 2022-09-27 DIAGNOSIS — Z85828 Personal history of other malignant neoplasm of skin: Secondary | ICD-10-CM | POA: Diagnosis not present

## 2022-09-27 DIAGNOSIS — L680 Hirsutism: Secondary | ICD-10-CM | POA: Diagnosis not present

## 2022-09-27 DIAGNOSIS — L570 Actinic keratosis: Secondary | ICD-10-CM | POA: Diagnosis not present

## 2022-10-05 DIAGNOSIS — L570 Actinic keratosis: Secondary | ICD-10-CM | POA: Diagnosis not present

## 2022-12-29 DIAGNOSIS — E782 Mixed hyperlipidemia: Secondary | ICD-10-CM | POA: Diagnosis not present

## 2022-12-29 DIAGNOSIS — Z6821 Body mass index (BMI) 21.0-21.9, adult: Secondary | ICD-10-CM | POA: Diagnosis not present

## 2022-12-29 DIAGNOSIS — E1169 Type 2 diabetes mellitus with other specified complication: Secondary | ICD-10-CM | POA: Diagnosis not present

## 2022-12-29 DIAGNOSIS — Z0000 Encounter for general adult medical examination without abnormal findings: Secondary | ICD-10-CM | POA: Diagnosis not present

## 2022-12-29 DIAGNOSIS — E119 Type 2 diabetes mellitus without complications: Secondary | ICD-10-CM | POA: Diagnosis not present

## 2023-01-04 DIAGNOSIS — M810 Age-related osteoporosis without current pathological fracture: Secondary | ICD-10-CM | POA: Diagnosis not present

## 2023-01-04 DIAGNOSIS — M8580 Other specified disorders of bone density and structure, unspecified site: Secondary | ICD-10-CM | POA: Diagnosis not present

## 2023-01-04 DIAGNOSIS — E039 Hypothyroidism, unspecified: Secondary | ICD-10-CM | POA: Diagnosis not present

## 2023-01-04 DIAGNOSIS — J4520 Mild intermittent asthma, uncomplicated: Secondary | ICD-10-CM | POA: Diagnosis not present

## 2023-01-04 DIAGNOSIS — I10 Essential (primary) hypertension: Secondary | ICD-10-CM | POA: Diagnosis not present

## 2023-01-04 DIAGNOSIS — Z6821 Body mass index (BMI) 21.0-21.9, adult: Secondary | ICD-10-CM | POA: Diagnosis not present

## 2023-01-04 DIAGNOSIS — E559 Vitamin D deficiency, unspecified: Secondary | ICD-10-CM | POA: Diagnosis not present

## 2023-01-04 DIAGNOSIS — Z1239 Encounter for other screening for malignant neoplasm of breast: Secondary | ICD-10-CM | POA: Diagnosis not present

## 2023-01-04 DIAGNOSIS — K219 Gastro-esophageal reflux disease without esophagitis: Secondary | ICD-10-CM | POA: Diagnosis not present

## 2023-01-04 DIAGNOSIS — E782 Mixed hyperlipidemia: Secondary | ICD-10-CM | POA: Diagnosis not present

## 2023-01-04 DIAGNOSIS — E119 Type 2 diabetes mellitus without complications: Secondary | ICD-10-CM | POA: Diagnosis not present

## 2023-01-11 DIAGNOSIS — Z23 Encounter for immunization: Secondary | ICD-10-CM | POA: Diagnosis not present

## 2023-04-03 ENCOUNTER — Other Ambulatory Visit: Payer: Self-pay | Admitting: Family Medicine

## 2023-04-03 DIAGNOSIS — Z1231 Encounter for screening mammogram for malignant neoplasm of breast: Secondary | ICD-10-CM

## 2023-04-24 ENCOUNTER — Ambulatory Visit
Admission: RE | Admit: 2023-04-24 | Discharge: 2023-04-24 | Disposition: A | Discharge status: Home / Self Care | Payer: Medicare Other | Source: Ambulatory Visit | Attending: Family Medicine | Admitting: Family Medicine

## 2023-04-24 DIAGNOSIS — Z1231 Encounter for screening mammogram for malignant neoplasm of breast: Secondary | ICD-10-CM | POA: Diagnosis not present

## 2023-05-09 DIAGNOSIS — R509 Fever, unspecified: Secondary | ICD-10-CM | POA: Diagnosis not present

## 2023-05-09 DIAGNOSIS — B349 Viral infection, unspecified: Secondary | ICD-10-CM | POA: Diagnosis not present

## 2023-07-21 DIAGNOSIS — H6123 Impacted cerumen, bilateral: Secondary | ICD-10-CM | POA: Diagnosis not present

## 2023-07-28 DIAGNOSIS — Z9012 Acquired absence of left breast and nipple: Secondary | ICD-10-CM | POA: Diagnosis not present

## 2023-08-02 DIAGNOSIS — E039 Hypothyroidism, unspecified: Secondary | ICD-10-CM | POA: Diagnosis not present

## 2023-08-03 DIAGNOSIS — Z961 Presence of intraocular lens: Secondary | ICD-10-CM | POA: Diagnosis not present

## 2023-08-03 DIAGNOSIS — H04123 Dry eye syndrome of bilateral lacrimal glands: Secondary | ICD-10-CM | POA: Diagnosis not present

## 2023-08-03 DIAGNOSIS — H0102A Squamous blepharitis right eye, upper and lower eyelids: Secondary | ICD-10-CM | POA: Diagnosis not present

## 2023-08-03 DIAGNOSIS — E119 Type 2 diabetes mellitus without complications: Secondary | ICD-10-CM | POA: Diagnosis not present

## 2023-08-03 DIAGNOSIS — H0102B Squamous blepharitis left eye, upper and lower eyelids: Secondary | ICD-10-CM | POA: Diagnosis not present

## 2023-08-03 DIAGNOSIS — H43393 Other vitreous opacities, bilateral: Secondary | ICD-10-CM | POA: Diagnosis not present

## 2023-08-03 DIAGNOSIS — H1045 Other chronic allergic conjunctivitis: Secondary | ICD-10-CM | POA: Diagnosis not present

## 2023-08-22 DIAGNOSIS — H903 Sensorineural hearing loss, bilateral: Secondary | ICD-10-CM | POA: Diagnosis not present

## 2023-09-27 DIAGNOSIS — L570 Actinic keratosis: Secondary | ICD-10-CM | POA: Diagnosis not present

## 2023-09-27 DIAGNOSIS — L821 Other seborrheic keratosis: Secondary | ICD-10-CM | POA: Diagnosis not present

## 2023-09-27 DIAGNOSIS — L578 Other skin changes due to chronic exposure to nonionizing radiation: Secondary | ICD-10-CM | POA: Diagnosis not present

## 2023-09-27 DIAGNOSIS — Z85828 Personal history of other malignant neoplasm of skin: Secondary | ICD-10-CM | POA: Diagnosis not present

## 2023-09-27 DIAGNOSIS — L820 Inflamed seborrheic keratosis: Secondary | ICD-10-CM | POA: Diagnosis not present

## 2023-09-27 DIAGNOSIS — D229 Melanocytic nevi, unspecified: Secondary | ICD-10-CM | POA: Diagnosis not present

## 2023-09-27 DIAGNOSIS — L814 Other melanin hyperpigmentation: Secondary | ICD-10-CM | POA: Diagnosis not present

## 2024-03-15 ENCOUNTER — Emergency Department (HOSPITAL_BASED_OUTPATIENT_CLINIC_OR_DEPARTMENT_OTHER)

## 2024-03-15 ENCOUNTER — Other Ambulatory Visit: Payer: Self-pay

## 2024-03-15 ENCOUNTER — Encounter (HOSPITAL_BASED_OUTPATIENT_CLINIC_OR_DEPARTMENT_OTHER): Payer: Self-pay | Admitting: Emergency Medicine

## 2024-03-15 ENCOUNTER — Observation Stay (HOSPITAL_BASED_OUTPATIENT_CLINIC_OR_DEPARTMENT_OTHER)
Admission: EM | Admit: 2024-03-15 | Discharge: 2024-03-17 | Disposition: A | Attending: Family Medicine | Admitting: Family Medicine

## 2024-03-15 DIAGNOSIS — G459 Transient cerebral ischemic attack, unspecified: Principal | ICD-10-CM

## 2024-03-15 DIAGNOSIS — E119 Type 2 diabetes mellitus without complications: Secondary | ICD-10-CM | POA: Insufficient documentation

## 2024-03-15 DIAGNOSIS — I781 Nevus, non-neoplastic: Secondary | ICD-10-CM | POA: Insufficient documentation

## 2024-03-15 DIAGNOSIS — J45909 Unspecified asthma, uncomplicated: Secondary | ICD-10-CM | POA: Diagnosis not present

## 2024-03-15 DIAGNOSIS — Z853 Personal history of malignant neoplasm of breast: Secondary | ICD-10-CM | POA: Insufficient documentation

## 2024-03-15 DIAGNOSIS — R569 Unspecified convulsions: Secondary | ICD-10-CM | POA: Diagnosis not present

## 2024-03-15 DIAGNOSIS — I110 Hypertensive heart disease with heart failure: Secondary | ICD-10-CM | POA: Insufficient documentation

## 2024-03-15 DIAGNOSIS — R29818 Other symptoms and signs involving the nervous system: Secondary | ICD-10-CM | POA: Diagnosis not present

## 2024-03-15 DIAGNOSIS — I5020 Unspecified systolic (congestive) heart failure: Secondary | ICD-10-CM

## 2024-03-15 DIAGNOSIS — Z79899 Other long term (current) drug therapy: Secondary | ICD-10-CM | POA: Diagnosis not present

## 2024-03-15 DIAGNOSIS — G43909 Migraine, unspecified, not intractable, without status migrainosus: Secondary | ICD-10-CM | POA: Insufficient documentation

## 2024-03-15 DIAGNOSIS — I5040 Unspecified combined systolic (congestive) and diastolic (congestive) heart failure: Secondary | ICD-10-CM | POA: Insufficient documentation

## 2024-03-15 DIAGNOSIS — E039 Hypothyroidism, unspecified: Secondary | ICD-10-CM | POA: Diagnosis not present

## 2024-03-15 DIAGNOSIS — E785 Hyperlipidemia, unspecified: Secondary | ICD-10-CM | POA: Insufficient documentation

## 2024-03-15 DIAGNOSIS — M810 Age-related osteoporosis without current pathological fracture: Secondary | ICD-10-CM | POA: Insufficient documentation

## 2024-03-15 DIAGNOSIS — R4789 Other speech disturbances: Secondary | ICD-10-CM | POA: Diagnosis not present

## 2024-03-15 DIAGNOSIS — Z7984 Long term (current) use of oral hypoglycemic drugs: Secondary | ICD-10-CM | POA: Insufficient documentation

## 2024-03-15 DIAGNOSIS — R519 Headache, unspecified: Secondary | ICD-10-CM | POA: Diagnosis present

## 2024-03-15 DIAGNOSIS — I6529 Occlusion and stenosis of unspecified carotid artery: Secondary | ICD-10-CM | POA: Diagnosis not present

## 2024-03-15 DIAGNOSIS — Z91040 Latex allergy status: Secondary | ICD-10-CM | POA: Diagnosis not present

## 2024-03-15 LAB — CBC
HCT: 42.2 % (ref 36.0–46.0)
Hemoglobin: 14.2 g/dL (ref 12.0–15.0)
MCH: 29 pg (ref 26.0–34.0)
MCHC: 33.6 g/dL (ref 30.0–36.0)
MCV: 86.3 fL (ref 80.0–100.0)
Platelets: 287 K/uL (ref 150–400)
RBC: 4.89 MIL/uL (ref 3.87–5.11)
RDW: 13.2 % (ref 11.5–15.5)
WBC: 7.7 K/uL (ref 4.0–10.5)
nRBC: 0 % (ref 0.0–0.2)

## 2024-03-15 LAB — COMPREHENSIVE METABOLIC PANEL WITH GFR
ALT: 8 U/L (ref 0–44)
AST: 18 U/L (ref 15–41)
Albumin: 4 g/dL (ref 3.5–5.0)
Alkaline Phosphatase: 54 U/L (ref 38–126)
Anion gap: 17 — ABNORMAL HIGH (ref 5–15)
BUN: 26 mg/dL — ABNORMAL HIGH (ref 8–23)
CO2: 25 mmol/L (ref 22–32)
Calcium: 9.2 mg/dL (ref 8.9–10.3)
Chloride: 93 mmol/L — ABNORMAL LOW (ref 98–111)
Creatinine, Ser: 1.29 mg/dL — ABNORMAL HIGH (ref 0.44–1.00)
GFR, Estimated: 42 mL/min — ABNORMAL LOW
Glucose, Bld: 113 mg/dL — ABNORMAL HIGH (ref 70–99)
Potassium: 3.7 mmol/L (ref 3.5–5.1)
Sodium: 135 mmol/L (ref 135–145)
Total Bilirubin: 0.5 mg/dL (ref 0.0–1.2)
Total Protein: 7.3 g/dL (ref 6.5–8.1)

## 2024-03-15 LAB — DIFFERENTIAL
Abs Immature Granulocytes: 0.03 K/uL (ref 0.00–0.07)
Basophils Absolute: 0.1 K/uL (ref 0.0–0.1)
Basophils Relative: 1 %
Eosinophils Absolute: 0.1 K/uL (ref 0.0–0.5)
Eosinophils Relative: 2 %
Immature Granulocytes: 0 %
Lymphocytes Relative: 35 %
Lymphs Abs: 2.7 K/uL (ref 0.7–4.0)
Monocytes Absolute: 1.1 K/uL — ABNORMAL HIGH (ref 0.1–1.0)
Monocytes Relative: 15 %
Neutro Abs: 3.6 K/uL (ref 1.7–7.7)
Neutrophils Relative %: 47 %

## 2024-03-15 LAB — PROTIME-INR
INR: 0.9 (ref 0.8–1.2)
Prothrombin Time: 13.1 s (ref 11.4–15.2)

## 2024-03-15 LAB — CBG MONITORING, ED: Glucose-Capillary: 114 mg/dL — ABNORMAL HIGH (ref 70–99)

## 2024-03-15 LAB — ETHANOL: Alcohol, Ethyl (B): <15 mg/dL

## 2024-03-15 LAB — APTT: aPTT: 31 s (ref 24–36)

## 2024-03-15 MED ORDER — SODIUM CHLORIDE 0.9% FLUSH
3.0000 mL | Freq: Once | INTRAVENOUS | Status: AC
  Administered 2024-03-15: 3 mL via INTRAVENOUS

## 2024-03-15 NOTE — ED Provider Notes (Signed)
 " [facility] EMERGENCY DEPARTMENT AT [facility] Provider Note   CSN: [id number] Arrival date & time: 03/15/24  2019     Patient presents with: Headache   Alyssa Jackson is a 79 y.o. female.   79 year old female who presents with headache to the top of her head which began today.  States that she had a very stressful meeting today.  Took a gram of Tylenol  and went to sleep and when she awoke she had trouble with word finding.  Has had similar symptoms with stress associate in the past.  Called her son who then called a family friend who is a publishing rights manager who came by and some the history is from her.  She states that when she got there, patient did not have any facial droop.  She did have word finding problems.  Did have some nausea but no vomiting.  No fever, photophobia, neck pain.  She was not ataxic when she walked.  States that she had no focal weakness when she tested her strength.  Her symptoms lasted for about an hour and have now resolved.  She is currently back to her baseline       Prior to Admission medications  Medication Sig Start Date End Date Taking? Authorizing Provider  acetaminophen  (TYLENOL ) 650 MG CR tablet Take 650-1,300 mg by mouth every 8 (eight) hours as needed for pain.    [provider]  albuterol  (PROVENTIL  HFA;VENTOLIN  HFA) 108 (90 BASE) MCG/ACT inhaler Inhale 2 puffs into the lungs as needed for wheezing.    [provider]  aspirin  81 MG tablet Take 81 mg by mouth daily.    [provider]  B Complex Vitamins (VITAMIN B COMPLEX) TABS 1 tablet    [provider]  Cholecalciferol (VITAMIN D3) 125 MCG (5000 UT) CAPS Take 5,000 Units by mouth daily.    [provider]  FLOVENT HFA 110 MCG/ACT inhaler Inhale 2 puffs into the lungs 2 (two) times daily. 07/27/20   [provider]  ibandronate (BONIVA) 150 MG tablet Take 150 mg by mouth every 30 (thirty) days. 07/18/20   [provider]   levothyroxine  (SYNTHROID ) 88 MCG tablet Take 88 mcg by mouth daily. 07/20/20   [provider]  Magnesium 250 MG TABS 1 capsule with a meal    [provider]  metFORMIN  (GLUCOPHAGE ) 1000 MG tablet Take 1,000 mg by mouth 2 (two) times daily.    [provider]  Phenazopyridine  HCl (AZO TABS PO) Take 2 tablets by mouth 3 (three) times daily.    [provider]  polyvinyl alcohol  (LIQUIFILM TEARS) 1.4 % ophthalmic solution Place 1 drop into both eyes as needed for dry eyes.    [provider]  Potassium 99 MG TABS 1 tablet    [provider]  pravastatin  (PRAVACHOL ) 40 MG tablet Take 40 mg by mouth daily.     [provider]  Propylene Glycol (SYSTANE COMPLETE) 0.6 % SOLN See admin instructions.    [provider]  quinapril (ACCUPRIL) 5 MG tablet Take 5 mg by mouth daily.     [provider]  silodosin  (RAPAFLO ) 8 MG CAPS capsule Take 1 capsule (8 mg total) by mouth at bedtime. Substitute for Tamsulosin (Flomax) because of Sulfa allergy. 02/03/19   Cam Morene ORN, MD    Allergies: Carboplatin, Latex, Penicillins, Sulfa drugs cross reactors, Adhesive [tape], Hydrocodone , Iodinated contrast media, Iodine, and Sulfa antibiotics    Review of Systems  All  other systems reviewed and are negative.   Updated Vital Signs BP 115/81   Pulse (!) 109   Temp 97.6 F (36.4 C)   Resp 16   Ht 1.626 m (5' 4)   Wt 63.5 kg   SpO2 100%   BMI 24.03 kg/m   Physical Exam Vitals and nursing note reviewed.  Constitutional:      General: She is not in acute distress.    Appearance: Normal appearance. She is well-developed. She is not toxic-appearing.  HENT:     Head: Normocephalic and atraumatic.  Eyes:     General: Lids are normal.     Conjunctiva/sclera: Conjunctivae normal.     Pupils: Pupils are equal, round, and reactive to light.  Neck:     Thyroid : No thyroid  mass.     Trachea: No tracheal deviation.   Cardiovascular:     Rate and Rhythm: Normal rate and regular rhythm.     Heart sounds: Normal heart sounds. No murmur heard.    No gallop.  Pulmonary:     Effort: Pulmonary effort is normal. No respiratory distress.     Breath sounds: Normal breath sounds. No stridor. No decreased breath sounds, wheezing, rhonchi or rales.  Abdominal:     General: There is no distension.     Palpations: Abdomen is soft.     Tenderness: There is no abdominal tenderness. There is no rebound.  Musculoskeletal:        General: No tenderness. Normal range of motion.     Cervical back: Normal range of motion and neck supple.  Skin:    General: Skin is warm and dry.     Findings: No abrasion or rash.  Neurological:     General: No focal deficit present.     Mental Status: She is alert and oriented to person, place, and time. Mental status is at baseline.     GCS: GCS eye subscore is 4. GCS verbal subscore is 5. GCS motor subscore is 6.     Cranial Nerves: No cranial nerve deficit.     Sensory: No sensory deficit.     Motor: Motor function is intact.     Gait: Gait is intact.  Psychiatric:        Attention and Perception: Attention normal.        Speech: Speech normal.        Behavior: Behavior normal.     (all labs ordered are listed, but only abnormal results are displayed) Labs Reviewed  CBG MONITORING, ED - Abnormal; Notable for the following components:      Result Value   Glucose-Capillary 114 (*)    All other components within normal limits  PROTIME-INR  APTT  CBC  DIFFERENTIAL  COMPREHENSIVE METABOLIC PANEL WITH GFR  ETHANOL  CBG MONITORING, ED    EKG: EKG Interpretation Date/Time:  Friday March 15 2024 20:38:13 EST Ventricular Rate:  103 PR Interval:  148 QRS Duration:  78 QT Interval:  334 QTC Calculation: 437 R Axis:   -9  Text Interpretation: Sinus tachycardia Biatrial enlargement Septal infarct , age undetermined Abnormal ECG When compared with ECG of 23-Jan-2019  08:14, Septal infarct is now Present Confirmed by Dasie Faden ([id number]) on 03/15/2024 8:54:58 PM  Radiology: No results found.   Procedures   Medications Ordered in the ED  sodium chloride  flush (NS) 0.9 % injection 3 mL (has no administration in time range)  Medical Decision Making Amount and/or Complexity of Data Reviewed Labs: ordered. Radiology: ordered.   Patient's EKG shows sinus tachycardia.  Patient symptoms have resolved at this time.  Head CT without acute findings.  Labs reassuring here.  Patient has symptoms of expressive aphasia and confusion for about an hour.  Patient's friend who is a publishing rights manager informed us .  Her headache has resolved.  Unclear if patient had a complex migraine versus TIA versus stress reaction.  Feel that patient would benefit from inpatient TIA evaluation.  Discussed with neurohospitalist who will see the patient in consultation.  Will admit to the hospitalist team     Final diagnoses:  None    ED Discharge Orders     None          Dasie Faden, MD 03/15/24 2233  "

## 2024-03-15 NOTE — Progress Notes (Signed)
 Plan of Care Note for accepted transfer   Patient: Alyssa Jackson MRN: [id number]   DOA: 03/15/2024  Facility requesting transfer: [facility]   Requesting Provider: Dr. Dasie   Reason for transfer: TIA   Facility course: 79 yr old female with HTN, T2DM, CKD 3A, and hypothyroidism presents after ~1 hr of speech difficulty. She had a headache, took a nap ~15:45, and then woke ~18:30 with word-finding difficulty. There are no acute findings on head CT. Dr. Voncile of neurology was consulted.   Plan of care: The patient is accepted for admission to Telemetry unit, at [facility].   Author: Evalene GORMAN Sprinkles, MD 03/15/2024  Check [email or web address] for on-call coverage.  Nursing staff, Please call [facility] Admits & Consults System-Wide number on Amion as soon as patient's arrival, so appropriate admitting provider can evaluate the pt.

## 2024-03-15 NOTE — ED Triage Notes (Signed)
 Patient here endorsing a headache that started at 3:30 pm today, she took 2 500 mg tablets of tylenol  and fell asleep around 3:45, woke up around 6:30 and was experiencing expressive aphasia and slurred speech. She states the speech returned to normal at 7:30 pm. In triage she endorses her speech is normal. The patient is Aox4. Headache has eased off, but still feels the pain in the back of her head. No neurological deficits noted at this time.

## 2024-03-16 ENCOUNTER — Observation Stay (HOSPITAL_COMMUNITY)

## 2024-03-16 DIAGNOSIS — G459 Transient cerebral ischemic attack, unspecified: Secondary | ICD-10-CM

## 2024-03-16 DIAGNOSIS — R29818 Other symptoms and signs involving the nervous system: Secondary | ICD-10-CM | POA: Diagnosis present

## 2024-03-16 DIAGNOSIS — G43809 Other migraine, not intractable, without status migrainosus: Secondary | ICD-10-CM

## 2024-03-16 DIAGNOSIS — Z743 Need for continuous supervision: Secondary | ICD-10-CM | POA: Diagnosis not present

## 2024-03-16 DIAGNOSIS — G4489 Other headache syndrome: Secondary | ICD-10-CM | POA: Diagnosis not present

## 2024-03-16 LAB — HEMOGLOBIN A1C
Hgb A1c MFr Bld: 5.9 % — ABNORMAL HIGH (ref 4.8–5.6)
Mean Plasma Glucose: 122.63 mg/dL

## 2024-03-16 LAB — TSH: TSH: 10.6 u[IU]/mL — ABNORMAL HIGH (ref 0.350–4.500)

## 2024-03-16 MED ORDER — LISINOPRIL 10 MG PO TABS
10.0000 mg | ORAL_TABLET | Freq: Every day | ORAL | Status: DC
  Administered 2024-03-16: 10 mg via ORAL
  Filled 2024-03-16: qty 1

## 2024-03-16 MED ORDER — LEVOTHYROXINE SODIUM 75 MCG PO TABS
75.0000 ug | ORAL_TABLET | Freq: Every day | ORAL | Status: DC
  Administered 2024-03-16 – 2024-03-17 (×2): 75 ug via ORAL
  Filled 2024-03-16 (×2): qty 1

## 2024-03-16 MED ORDER — STROKE: EARLY STAGES OF RECOVERY BOOK
Freq: Once | Status: AC
  Filled 2024-03-16: qty 1

## 2024-03-16 MED ORDER — PRAVASTATIN SODIUM 40 MG PO TABS
20.0000 mg | ORAL_TABLET | Freq: Every day | ORAL | Status: DC
  Administered 2024-03-16 – 2024-03-17 (×2): 20 mg via ORAL
  Filled 2024-03-16 (×2): qty 1

## 2024-03-16 MED ORDER — LOPERAMIDE HCL 2 MG PO CAPS
4.0000 mg | ORAL_CAPSULE | Freq: Once | ORAL | Status: AC
  Administered 2024-03-16: 4 mg via ORAL
  Filled 2024-03-16: qty 2

## 2024-03-16 MED ORDER — GADOBUTROL 1 MMOL/ML IV SOLN
6.0000 mL | Freq: Once | INTRAVENOUS | Status: AC | PRN
  Administered 2024-03-16: 6 mL via INTRAVENOUS

## 2024-03-16 MED ORDER — METFORMIN HCL 500 MG PO TABS
1000.0000 mg | ORAL_TABLET | Freq: Two times a day (BID) | ORAL | Status: DC
  Administered 2024-03-16 – 2024-03-17 (×3): 1000 mg via ORAL
  Filled 2024-03-16 (×3): qty 2

## 2024-03-16 MED ORDER — CLOPIDOGREL BISULFATE 75 MG PO TABS
75.0000 mg | ORAL_TABLET | Freq: Every day | ORAL | Status: DC
  Administered 2024-03-16 – 2024-03-17 (×2): 75 mg via ORAL
  Filled 2024-03-16 (×2): qty 1

## 2024-03-16 MED ORDER — ASPIRIN 81 MG PO TBEC
81.0000 mg | DELAYED_RELEASE_TABLET | Freq: Every day | ORAL | Status: DC
  Administered 2024-03-16: 81 mg via ORAL
  Filled 2024-03-16 (×3): qty 1

## 2024-03-16 NOTE — Progress Notes (Signed)
 OT Cancellation Note - OT Screen and Sign Off  Patient Details Name: Alyssa Jackson MRN: [id number] DOB: [birth year]   Cancelled Treatment:    Reason Eval/Treat Not Completed: OT screened, no needs identified, will sign off (Pt currently demonstrating ability to complete ADLs and funcitonal mobility Independent and reports she is at her baseline. Also discussed with PT. No further benefit from acute OT at this time. OT is signing off.)  Margarie Rockey HERO., OTR/L, MA Acute Rehab [phone]   Margarie FORBES Horns 03/16/2024, 4:39 PM

## 2024-03-16 NOTE — Progress Notes (Signed)
 Pt is off from unit for MRA procedure.

## 2024-03-16 NOTE — Progress Notes (Signed)
Pt is back to unit.

## 2024-03-16 NOTE — Progress Notes (Signed)
 EEG setup was in progress when patient had to urgently get out of bed to use the bathroom and leads came off as a result. Due to reported frequency of these events and patient having to be still for 90 mins during testing tech is unable to complete the EEG at this time. Ordering provider and nurse made aware. EEG to be completed once imodium  has taken effect.

## 2024-03-16 NOTE — Progress Notes (Signed)
 SLP Cancellation Note  Patient Details Name: GENESIA CASLIN MRN: [id number] DOB: 1946-02-24   Cancelled treatment:       Reason Eval/Treat Not Completed: SLP screened, no needs identified, will sign off (A&Ox4. Pt and several family members present endorse a full return to baseline.)   Manuelita Blew M.S. CCC-SLP
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
## 2024-03-16 NOTE — Progress Notes (Signed)
 Came bedside for echo twice today, but patient is in MRI both times.

## 2024-03-16 NOTE — ED Notes (Signed)
 Pt reported to this nurse that she took her home bedtime meds at 2300. Dr. Geroldine notified face to face  Metformin  Asprin AZO Potassium

## 2024-03-16 NOTE — ED Notes (Signed)
 George at [facility] called to confirm that transport is in route 07:52-TC

## 2024-03-16 NOTE — ED Notes (Signed)
 Pt ambulated to the br. Tolerated well. Denies dizziness, HA at this time.
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
## 2024-03-16 NOTE — ED Notes (Signed)
Ambulated to the bathroom. Tolerated well.

## 2024-03-16 NOTE — Evaluation (Addendum)
 Physical Therapy Evaluation Patient Details Name: Alyssa Jackson MRN: [id number] DOB: [birth year] Today's Date: 03/16/2024  History of Present Illness  79 y.o. female presents [facility]/15/24 from [facility] with h/a and speech difficulties. Head CT negative for acute findings, MRI pending. PMHx: L breast cancer s/p lumpectomy, chemotherapy, radiation and eventual mastectomy, chronic L shoulder pain from prior L chest radiation, HTN, HLD, DM2, hypothyroidism, seizures and migraines   Clinical Impression  Pt evaluated by Physical Therapy with no further acute PT needs identified as pt is at an independent functional level. Pt also scored a 23/24 on the DGI indicating that pt is at a low risk of future falls. Pt reported being at baseline for mobility, cognition, speech, and ADLs. Pt can have 24/7 assist available from family/friends upon d/c home if necessary. All education has been completed with pt reporting no further questions/concerns. Ok for [employer] home from a PT standpoint. PT is signing off. Thank you for this referral.         If plan is discharge home, recommend the following: Assist for transportation   Can travel by private vehicle    Yes    Equipment Recommendations None recommended by PT     Functional Status Assessment Patient has not had a recent decline in their functional status     Precautions / Restrictions Precautions Precautions: None Restrictions Weight Bearing Restrictions Per Provider Order: No      Mobility  Bed Mobility  General bed mobility comments: received in bathroom, returned to EOB    Transfers Overall transfer level: Independent Equipment used: None     Ambulation/Gait Ambulation/Gait assistance: Independent Gait Distance (Feet): 400 Feet Assistive device: None Gait Pattern/deviations: WFL(Within Functional Limits)     Stairs Stairs: Yes Stairs assistance: Independent Stair Management: No rails, Forwards, Alternating pattern Number of Stairs:  4    Modified Rankin (Stroke Patients Only) Modified Rankin (Stroke Patients Only) Pre-Morbid Rankin Score: No symptoms Modified Rankin: No symptoms     Balance Overall balance assessment: Independent    Standardized Balance Assessment Standardized Balance Assessment : Dynamic Gait Index   Dynamic Gait Index Level Surface: Normal Change in Gait Speed: Normal Gait with Horizontal Head Turns: Mild Impairment Gait with Vertical Head Turns: Normal Gait and Pivot Turn: Normal Step Over Obstacle: Normal Step Around Obstacles: Normal Steps: Normal Total Score: 23       Pertinent Vitals/Pain Pain Assessment Pain Assessment: No/denies pain    Home Living Family/patient expects to be discharged to:: Private residence   Available Help at Discharge: Family;Available 24 hours/day Type of Home: House Home Access: Stairs to enter Entrance Stairs-Rails: Left Entrance Stairs-Number of Steps: 2   Home Layout: Two level;Full bath on main level;Able to live on main level with bedroom/bathroom Home Equipment: Grab bars - tub/shower;Grab bars - toilet;Hand held shower head      Prior Function Prior Level of Function : Independent/Modified Independent;Driving    Mobility Comments: Ind with no AD ADLs 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     Extremity/Trunk Assessment   Upper Extremity Assessment Upper Extremity Assessment: Overall WFL for tasks assessed    Lower Extremity Assessment Lower Extremity Assessment: Overall WFL for tasks assessed    Cervical / Trunk Assessment Cervical / Trunk Assessment: Normal  Communication   Communication Communication: No apparent difficulties    Cognition Arousal: Alert Behavior During Therapy: WFL for tasks assessed/performed   PT - Cognitive impairments: No apparent impairments    Following commands: Intact  Cueing Cueing Techniques: Verbal cues      PT Assessment Patient does not need any further PT services         PT  Goals (Current goals can be found in the Care Plan section)  Acute Rehab PT Goals PT Goal Formulation: All assessment and education complete, DC therapy     AM-PAC PT 6 Clicks Mobility  Outcome Measure Help needed turning from your back to your side while in a flat bed without using bedrails?: None Help needed moving from lying on your back to sitting on the side of a flat bed without using bedrails?: None Help needed moving to and from a bed to a chair (including a wheelchair)?: None Help needed standing up from a chair using your arms (e.g., wheelchair or bedside chair)?: None Help needed to walk in hospital room?: None Help needed climbing 3-5 steps with a railing? : None 6 Click Score: 24    End of Session   Activity Tolerance: Patient tolerated treatment well Patient left: in bed;with call bell/phone within reach;with family/visitor present Nurse Communication: Mobility status PT Visit Diagnosis: Other abnormalities of gait and mobility (R26.89)    Time: 8556-8493 PT Time Calculation (min) (ACUTE ONLY): 23 min   Charges:   PT Evaluation $PT Eval Low Complexity: 1 Low   PT General Charges $$ ACUTE PT VISIT: 1 Visit       Kate ORN, PT, DPT Secure Chat Preferred  Rehab Office [phone]  Kate BRAVO Wendolyn 03/16/2024, 3:12 PM

## 2024-03-17 ENCOUNTER — Observation Stay (HOSPITAL_COMMUNITY)

## 2024-03-17 DIAGNOSIS — I69391 Dysphagia following cerebral infarction: Secondary | ICD-10-CM | POA: Diagnosis not present

## 2024-03-17 DIAGNOSIS — Z7982 Long term (current) use of aspirin: Secondary | ICD-10-CM | POA: Diagnosis not present

## 2024-03-17 DIAGNOSIS — G459 Transient cerebral ischemic attack, unspecified: Secondary | ICD-10-CM

## 2024-03-17 DIAGNOSIS — R569 Unspecified convulsions: Secondary | ICD-10-CM | POA: Diagnosis not present

## 2024-03-17 DIAGNOSIS — Z7984 Long term (current) use of oral hypoglycemic drugs: Secondary | ICD-10-CM | POA: Diagnosis not present

## 2024-03-17 DIAGNOSIS — I10 Essential (primary) hypertension: Secondary | ICD-10-CM | POA: Diagnosis not present

## 2024-03-17 DIAGNOSIS — I639 Cerebral infarction, unspecified: Secondary | ICD-10-CM | POA: Diagnosis not present

## 2024-03-17 DIAGNOSIS — E119 Type 2 diabetes mellitus without complications: Secondary | ICD-10-CM

## 2024-03-17 DIAGNOSIS — R29818 Other symptoms and signs involving the nervous system: Secondary | ICD-10-CM | POA: Diagnosis not present

## 2024-03-17 LAB — BASIC METABOLIC PANEL WITH GFR
Anion gap: 12 (ref 5–15)
BUN: 17 mg/dL (ref 8–23)
CO2: 24 mmol/L (ref 22–32)
Calcium: 9 mg/dL (ref 8.9–10.3)
Chloride: 100 mmol/L (ref 98–111)
Creatinine, Ser: 0.94 mg/dL (ref 0.44–1.00)
GFR, Estimated: >60 mL/min
Glucose, Bld: 107 mg/dL — ABNORMAL HIGH (ref 70–99)
Potassium: 4.4 mmol/L (ref 3.5–5.1)
Sodium: 137 mmol/L (ref 135–145)

## 2024-03-17 LAB — ECHOCARDIOGRAM COMPLETE
Calc EF: 44.2 %
Height: 64 in
S' Lateral: 2.7 cm
Single Plane A2C EF: 45.5 %
Single Plane A4C EF: 47 %
Weight: 2239.87 [oz_av]

## 2024-03-17 LAB — LIPID PANEL
Cholesterol: 136 mg/dL (ref 0–200)
HDL: 31 mg/dL — ABNORMAL LOW
LDL Cholesterol: 76 mg/dL (ref 0–99)
Total CHOL/HDL Ratio: 4.4 ratio
Triglycerides: 147 mg/dL
VLDL: 29 mg/dL (ref 0–40)

## 2024-03-17 LAB — CBC
HCT: 39.9 % (ref 36.0–46.0)
Hemoglobin: 13.6 g/dL (ref 12.0–15.0)
MCH: 29.2 pg (ref 26.0–34.0)
MCHC: 34.1 g/dL (ref 30.0–36.0)
MCV: 85.6 fL (ref 80.0–100.0)
Platelets: 279 K/uL (ref 150–400)
RBC: 4.66 MIL/uL (ref 3.87–5.11)
RDW: 13.1 % (ref 11.5–15.5)
WBC: 7.1 K/uL (ref 4.0–10.5)
nRBC: 0 % (ref 0.0–0.2)

## 2024-03-17 LAB — T4, FREE: Free T4: 1.44 ng/dL (ref 0.80–2.00)

## 2024-03-17 MED ORDER — CLOPIDOGREL BISULFATE 75 MG PO TABS: 75.0000 mg | ORAL_TABLET | Freq: Every day | ORAL | 0 refills | Status: AC

## 2024-03-17 NOTE — Plan of Care (Signed)
" °  Problem: Education: Goal: Knowledge of General Education information will improve Description: Including pain rating scale, medication(s)/side effects and non-pharmacologic comfort measures Outcome: Progressing   Problem: Health Behavior/Discharge Planning: Goal: Ability to manage health-related needs will improve Outcome: Progressing   Problem: Clinical Measurements: Goal: Will remain free from infection Outcome: Progressing   Problem: Nutrition: Goal: Adequate nutrition will be maintained Outcome: Progressing   Problem: Coping: Goal: Level of anxiety will decrease Outcome: Progressing   Problem: Elimination: Goal: Will not experience complications related to bowel motility Outcome: Progressing   Problem: Safety: Goal: Ability to remain free from injury will improve Outcome: Progressing   Problem: Ischemic Stroke/TIA Tissue Perfusion: Goal: Complications of ischemic stroke/TIA will be minimized Outcome: Progressing   Problem: Coping: Goal: Will verbalize positive feelings about self Outcome: Progressing   "

## 2024-03-17 NOTE — Progress Notes (Signed)
 EEG complete - results pending

## 2024-03-17 NOTE — Discharge Summary (Signed)
 Physician Discharge Summary  Alyssa Jackson Nurse FMW:[id number] DOB: Jan 28, 1946 DOA: 03/15/2024  PCP: Alyssa Artist PARAS, MD  Admit date: 03/15/2024 Discharge date: 03/17/2024  Time spent: 40 minutes  Recommendations for Outpatient Follow-up:  Follow outpatient CBC/CMP  Neurology follow up for TIA outpatient Cardiology follow up for newly diagnosed HFmrEF  Elevated TSH - repeat TSH outpatient, may need escalation in synthroid  dose Vascular follow up for carotid artery stenosis Follow BP outpatient   Discharge Diagnoses:  Principal Problem:   Transient neurologic deficit   Discharge Condition: stable  Diet recommendation: heart healthy, diabetic  Filed Weights   03/15/24 2033  Weight: 63.5 kg    History of present illness:   79F h/o L breast cancer s/p lumpectomy, chemotherapy, radiation and eventual mastectomy, chronic L shoulder pain from prior L chest radiation, HTN, HLD, DM2, hypothyroidism, seizures and migraines who p/w word finding difficulty.  Neurology was consulted.  Evaluated for TIA.  Recommending DAPT x3 weeks, then aspirin  alone.  Hospital Course:  Assessment and Plan:  TIA  Stroke Like Symptoms 2 hrs of word finding difficulty and speech issues after developing Alyssa Jackson HA MRI without acute abnormality MRA without significant stenosis of the intracranial vasculature Carotid US  with 40-59% stenosis on R EEG wnl  Echo with EF 40-45% PT/OT/SLP - no therapy needs A1c 5.9, LDL 76 Neurology c/s, appreciate recommendations - DAPT x 3 weeks, then aspirin  alone.  Continue home statin.  Outpatient neurology follow up.    Heart Failure with midrange EF Cardiology referral at discharge, she denies CP or Sob Continue lisinopril  Will defer GDMT to cards outpatient  Carotid Artery Stenosis Follow outpatient Continue antiplatelets, statin Vascular referral for follow   Hypertension Lisinopril   Hypothyroid Abnormal TSH - repeat outpatient, may need increase in  synthroid   T2DM A1c 5.9 Continue metformin  outpatient   Osteoporosis Boniva     Procedures: Echo IMPRESSIONS     1. Left ventricular ejection fraction, by estimation, is 40 to 45%. The  left ventricle has mildly decreased function. The left ventricle  demonstrates regional wall motion abnormalities (see scoring  diagram/findings for description). Left ventricular  diastolic function could not be evaluated.   2. Right ventricular systolic function is normal. The right ventricular  size is normal.   3. Left atrial size was grossly normal.   4. Right atrial size was grossly normal.   5. The mitral valve is degenerative. No evidence of mitral valve  regurgitation. No evidence of mitral stenosis.   6. The aortic valve is tricuspid. Aortic valve regurgitation is not  visualized. Aortic valve sclerosis is present, with no evidence of aortic  valve stenosis.   7. The inferior vena cava is normal in size with greater than 50%  respiratory variability, suggesting right atrial pressure of 3 mmHg.   Comparison(s): No prior Echocardiogram.   Carotid US  Summary:  Right Carotid: Velocities in the right ICA are consistent with Alyssa Jackson 1-39%  stenosis.   Left Carotid: Velocities in the left ICA are consistent with Alyssa Jackson 40-59%  stenosis.               (Velocities suggest upper end of scale).   Vertebrals:  Bilateral vertebral arteries demonstrate antegrade flow.  Subclavians: Normal flow hemodynamics were seen in bilateral subclavian               arteries.    Consultations: neurology  Discharge Exam: Vitals:   03/17/24 1141 03/17/24 1626  BP: (!) 112/54 (!) 118/56  Pulse: 79 73  Resp: 14 16  Temp: 97.8 F (36.6 C) (!) 97.5 F (36.4 C)  SpO2: 100%    Eager to d/c Frustrated with [facility] stay and the long wait  General: No acute distress. Cardiovascular: RRR Lungs: unlabored. Abdomen: Soft, nontender, nondistended with normal active bowel sounds. No masses. No  hepatosplenomegaly. Neurological: Alert and oriented 3. Moves all extremities 4 with equal strength. Cranial nerves II through XII intact.  FNF and heel to shin intact. Extremities: No clubbing or cyanosis. No edema.   Discharge Instructions   Discharge Instructions     Ambulatory referral to Cardiology   Complete by: As directed    Ambulatory referral to Neurology   Complete by: As directed    An appointment is requested in approximately: 4 weeks   Ambulatory referral to Vascular Surgery   Complete by: As directed    Call MD for:  difficulty breathing, headache or visual disturbances   Complete by: As directed    Call MD for:  extreme fatigue   Complete by: As directed    Call MD for:  hives   Complete by: As directed    Call MD for:  persistant dizziness or light-headedness   Complete by: As directed    Call MD for:  persistant nausea and vomiting   Complete by: As directed    Call MD for:  redness, tenderness, or signs of infection (pain, swelling, redness, odor or green/yellow discharge around incision site)   Complete by: As directed    Call MD for:  severe uncontrolled pain   Complete by: As directed    Call MD for:  temperature >100.4   Complete by: As directed    Discharge instructions   Complete by: As directed    You were seen for Alyssa Jackson TIA (transient ischemic attack or mini stroke).  Your symptoms have resolved.  Your MRI of your brain did not show any stroke.  Your head CT didn't show any acute abnormalities (you had mild chronic microvascular ischemic changes).  You've been seen by neurology.  We're planning to send you home with aspirin  and plavix  for 3 weeks, then you should transition to aspirin  alone.  Continue pravastatin .  We'll send you to neurology to follow up as an outpatient.  Your carotid ultrasound showed 40-59% stenosis on the left.  You should follow as an outpatient with vascular surgery.  Your ultrasound of your heart shows Alyssa Jackson weak pump.  Your  ejection fraction is 40-45% (normal is ~55%).  I'll refer you to cardiology outpatient so they can workup the cause and start you on medicines that are beneficial in heart failure.    You had an EEG (to look for seizures) which was normal.  Follow your blood pressures outpatient with your PCP.    Your thyroid  hormone test is mildly abnormal.  Follow [place] repeat test with your PCP as an outpatient.   Return for new, recurrent, or worsening symptoms.  Please ask your PCP to request records from this hospitalization so they know what was done and what the next steps will be.   Increase activity slowly   Complete by: As directed       Allergies as of 03/17/2024       Reactions   Carboplatin    Positive skin test documented.   Latex Other (See Comments)   Break out in blisters   Penicillins    Since childhood Has patient had Carlon Davidson PCN reaction causing immediate rash, facial/tongue/throat swelling, SOB or lightheadedness  with hypotension: Unknown Has patient had Zae Kirtz PCN reaction causing severe rash involving mucus membranes or skin necrosis: Unknown Has patient had Diavian Furgason PCN reaction that required hospitalization Unknown Has patient had Luberta Grabinski PCN reaction occurring within the last 10 years: No If all of the above answers are NO, then may proceed with Cephalosporin use.   Sulfa Drugs Cross Reactors Nausea And Vomiting   Adhesive [tape]    Hydrocodone     Iodinated Contrast Media Other (See Comments)   Iodine Other (See Comments)   Gallbladder dye contrast tablets, pt states is not allergic to Iodine Has had other dye test with no problems.   Sulfa Antibiotics Rash        Medication List     TAKE these medications    acetaminophen  650 MG CR tablet Commonly known as: TYLENOL  Take 650-1,300 mg by mouth every 8 (eight) hours as needed for pain.   albuterol  108 (90 Base) MCG/ACT inhaler Commonly known as: VENTOLIN  HFA Inhale 2 puffs into the lungs as needed for wheezing.   aspirin  81 MG  tablet Take 81 mg by mouth daily.   AZO CRANBERRY PO Take 1 tablet by mouth every other day.   clopidogrel  75 MG tablet Commonly known as: PLAVIX  Take 1 tablet (75 mg total) by mouth daily for 19 days. Start taking on: March 18, 2024   fluticasone 50 MCG/ACT nasal spray Commonly known as: FLONASE Place 2 sprays into both nostrils as needed for allergies or rhinitis.   ibandronate 150 MG tablet Commonly known as: BONIVA Take 150 mg by mouth every 30 (thirty) days.   levothyroxine  75 MCG tablet Commonly known as: SYNTHROID  Take 75 mcg by mouth daily.   lisinopril  10 MG tablet Commonly known as: ZESTRIL  Take 10 mg by mouth daily.   metFORMIN  1000 MG tablet Commonly known as: GLUCOPHAGE  Take 1,000 mg by mouth 2 (two) times daily.   Potassium 99 MG Tabs Take 1 tablet by mouth in the morning.   pravastatin  20 MG tablet Commonly known as: PRAVACHOL  Take 20 mg by mouth daily.   Systane Complete 0.6 % Soln Generic drug: Propylene Glycol Place 2 drops into both eyes as needed (for dry eyes).   Vitamin D3 125 MCG (5000 UT) Caps Take 5,000 Units by mouth daily.       Allergies[1]    The results of significant diagnostics from this hospitalization (including imaging, microbiology, ancillary and laboratory) are listed below for reference.    Significant Diagnostic Studies: ECHOCARDIOGRAM COMPLETE Result Date: 03/17/2024    ECHOCARDIOGRAM REPORT   Patient Name:   Alyssa Jackson Date of Exam: 03/17/2024 Medical Rec #:  [id number]       Height:       64.0 in Accession #:    [id number]      Weight:       140.0 lb Date of Birth:  1945-11-01        BSA:          1.681 m Patient Age:    78 years        BP:           112/54 mmHg Patient Gender: F               HR:           75 bpm. Exam Location:  Inpatient Procedure: 2D Echo, Cardiac Doppler and Color Doppler (Both Spectral and Color            Flow Doppler  were utilized during procedure). Indications:    Stroke I63.9  History:         Patient has prior history of Echocardiogram examinations, most                 recent 09/01/2011. Arrythmias:Tachycardia.  Sonographer:    Thea Norlander RCS Referring Phys: [id number] MARSHA ADA IMPRESSIONS  1. Left ventricular ejection fraction, by estimation, is 40 to 45%. The left ventricle has mildly decreased function. The left ventricle demonstrates regional wall motion abnormalities (see scoring diagram/findings for description). Left ventricular diastolic function could not be evaluated.  2. Right ventricular systolic function is normal. The right ventricular size is normal.  3. Left atrial size was grossly normal.  4. Right atrial size was grossly normal.  5. The mitral valve is degenerative. No evidence of mitral valve regurgitation. No evidence of mitral stenosis.  6. The aortic valve is tricuspid. Aortic valve regurgitation is not visualized. Aortic valve sclerosis is present, with no evidence of aortic valve stenosis.  7. The inferior vena cava is normal in size with greater than 50% respiratory variability, suggesting right atrial pressure of 3 mmHg. Comparison(s): No prior Echocardiogram. Conclusion(s)/Recommendation(s): No intracardiac source of embolism detected on this transthoracic study. Consider Rena Hunke transesophageal echocardiogram to exclude cardiac source of embolism if clinically indicated. FINDINGS  Left Ventricle: Left ventricular ejection fraction, by estimation, is 40 to 45%. The left ventricle has mildly decreased function. The left ventricle demonstrates regional wall motion abnormalities. The left ventricular internal cavity size was normal in size. There is no left ventricular hypertrophy. Left ventricular diastolic function could not be evaluated due to nondiagnostic images. Left ventricular diastolic function could not be evaluated.  LV Wall Scoring: The entire septum is hypokinetic. Right Ventricle: The right ventricular size is normal. No increase in right ventricular wall  thickness. Right ventricular systolic function is normal. Left Atrium: Left atrial size was grossly normal. Right Atrium: Right atrial size was grossly normal. Pericardium: There is no evidence of pericardial effusion. Mitral Valve: The mitral valve is degenerative in appearance. There is mild thickening of the mitral valve leaflet(s). No evidence of mitral valve regurgitation. No evidence of mitral valve stenosis. Tricuspid Valve: The tricuspid valve is normal in structure. Tricuspid valve regurgitation is trivial. No evidence of tricuspid stenosis. Aortic Valve: The aortic valve is tricuspid. Aortic valve regurgitation is not visualized. Aortic valve sclerosis is present, with no evidence of aortic valve stenosis. Pulmonic Valve: The pulmonic valve was not well visualized. Pulmonic valve regurgitation is not visualized. Aorta: The aortic root and ascending aorta are structurally normal, with no evidence of dilitation. Venous: The inferior vena cava is normal in size with greater than 50% respiratory variability, suggesting right atrial pressure of 3 mmHg. IAS/Shunts: The atrial septum is grossly normal.  LEFT VENTRICLE PLAX 2D LVIDd:         3.60 cm     Diastology LVIDs:         2.70 cm     LV e' medial:    7.00 cm/s LV PW:         0.70 cm     LV E/e' medial:  7.3 LV IVS:        0.70 cm     LV e' lateral:   14.30 cm/s                            LV E/e' lateral: 3.6  LV Volumes (MOD)  LV vol d, MOD A2C: 46.2 ml LV vol d, MOD A4C: 42.1 ml LV vol s, MOD A2C: 25.2 ml LV vol s, MOD A4C: 22.3 ml LV SV MOD A2C:     21.0 ml LV SV MOD A4C:     42.1 ml LV SV MOD BP:      19.3 ml LEFT ATRIUM         Index LA diam:    2.30 cm 1.37 cm/m  MV E velocity: 51.40 cm/s MV Ronold Hardgrove velocity: 63.90 cm/s MV E/Zelie Asbill ratio:  0.80 Sunit Tolia Electronically signed by Madonna Large Signature Date/Time: 03/17/2024/4:33:11 PM    Final    VAS US  CAROTID Result Date: 03/17/2024 Carotid Arterial Duplex Study Patient Name:  Alyssa Jackson  Date of Exam:    03/17/2024 Medical Rec #: [id number]        Accession #:    [id number] Date of Birth: 11-24-1945         Patient Gender: F Patient Age:   41 years Exam Location:  [facility] Procedure:      VAS US  CAROTID Referring Phys: ELIGIO LAV --------------------------------------------------------------------------------  Indications:       CVA. Risk Factors:      Hypertension, hyperlipidemia, Diabetes. Other Factors:     History of breast cancer chemotherapy and radiation                    treatment. Comparison Study:  No priors. Performing Technologist: Ricka Sturdivant-Jones RDMS, RVT  Examination Guidelines: Yacqub Baston complete evaluation includes B-mode imaging, spectral Doppler, color Doppler, and power Doppler as needed of all accessible portions of each vessel. Bilateral testing is considered an integral part of Dody Smartt complete examination. Limited examinations for reoccurring indications may be performed as noted.  Right Carotid Findings: +----------+--------+--------+--------+------------------+--------+           PSV cm/sEDV cm/sStenosisPlaque DescriptionComments +----------+--------+--------+--------+------------------+--------+ CCA Prox  82      14                                         +----------+--------+--------+--------+------------------+--------+ CCA Distal68      20                                         +----------+--------+--------+--------+------------------+--------+ ICA Prox  79      24      1-39%   heterogenous               +----------+--------+--------+--------+------------------+--------+ ICA Distal82      26                                         +----------+--------+--------+--------+------------------+--------+ ECA       95                                                 +----------+--------+--------+--------+------------------+--------+ +----------+--------+-------+----------------+-------------------+           PSV cm/sEDV cmsDescribe        Arm  Pressure (mmHG) +----------+--------+-------+----------------+-------------------+ [id number]  Multiphasic, WNL                    +----------+--------+-------+----------------+-------------------+ +---------+--------+--+--------+--+---------+ VertebralPSV cm/s58EDV cm/s20Antegrade +---------+--------+--+--------+--+---------+  Left Carotid Findings: +----------+--------+--------+--------+------------------+------------------+           PSV cm/sEDV cm/sStenosisPlaque DescriptionComments           +----------+--------+--------+--------+------------------+------------------+ CCA Prox  92      13                                                   +----------+--------+--------+--------+------------------+------------------+ CCA Distal104     17                                                   +----------+--------+--------+--------+------------------+------------------+ ICA Prox  198     55      40-59%  heterogenous      Upper end of scale +----------+--------+--------+--------+------------------+------------------+ ICA Mid   142     31                                                   +----------+--------+--------+--------+------------------+------------------+ ICA Distal98      29                                                   +----------+--------+--------+--------+------------------+------------------+ ECA       96                                                           +----------+--------+--------+--------+------------------+------------------+ +----------+--------+--------+----------------+-------------------+           PSV cm/sEDV cm/sDescribe        Arm Pressure (mmHG) +----------+--------+--------+----------------+-------------------+ [id number]             Multiphasic, WNL                    +----------+--------+--------+----------------+-------------------+ +---------+--------+--+--------+-+---------+ VertebralPSV  cm/s62EDV cm/s9Antegrade +---------+--------+--+--------+-+---------+   Summary: Right Carotid: Velocities in the right ICA are consistent with Beck Cofer 1-39% stenosis. Left Carotid: Velocities in the left ICA are consistent with Benna Arno 40-59% stenosis.               (Velocities suggest upper end of scale). Vertebrals:  Bilateral vertebral arteries demonstrate antegrade flow. Subclavians: Normal flow hemodynamics were seen in bilateral subclavian              arteries. *See table(s) above for measurements and observations.     Preliminary    EEG adult Result Date: 03/17/2024 Alyssa Arlin KIDD, MD     03/17/2024  4:20 PM Patient Name: Alyssa Jackson MRN: [id number] Epilepsy Attending: Arlin Jackson Alyssa Referring Physician/Provider: Georgina Basket, MD Date: 03/17/2024 Duration: 22.29 mins Patient history: 79 y.o. female who had Praneel Haisley sudden onset  of transient word finding difficulty and speech issues that lasted about 2 hours, which started after developing Darolyn Double headache. EEG to evaluate for seizure Level of alertness: Awake AEDs during EEG study: None Technical aspects: This EEG study was done with scalp electrodes positioned according to the 10-20 International system of electrode placement. Electrical activity was reviewed with band pass filter of 1-70Hz , sensitivity of 7 uV/mm, display speed of 62mm/sec with Aayra Hornbaker 60Hz  notched filter applied as appropriate. EEG data were recorded continuously and digitally stored.  Video monitoring was available and reviewed as appropriate. Description: The posterior dominant rhythm consists of 8-9Hz  activity of moderate voltage (25-35 uV) seen predominantly in posterior head regions, symmetric and reactive to eye opening and eye closing. Hyperventilation and photic stimulation were not performed.   IMPRESSION: This study is within normal limits. No seizures or epileptiform discharges were seen throughout the recording. [place] Skalicky normal interictal EEG does not exclude the diagnosis of epilepsy. Arlin MALVA Krebs   MR ANGIO HEAD WO CONTRAST Result Date: 03/16/2024 EXAM: MR Angiography Head without intravenous Contrast. 03/16/2024 10:32:00 PM TECHNIQUE: Magnetic resonance angiography images of the head without intravenous contrast. Multiplanar 2D and 3D reformatted images are provided for review. COMPARISON: MR Brain With and Without IV Contrast 03/16/2024 01:28 PM. CLINICAL HISTORY: Stroke, follow up. FINDINGS: ANTERIOR CIRCULATION: No significant stenosis of the internal carotid arteries. No significant stenosis of the anterior cerebral arteries. No significant stenosis of the middle cerebral arteries. No aneurysm. POSTERIOR CIRCULATION: No significant stenosis of the posterior cerebral arteries. No significant stenosis of the basilar artery. No significant stenosis of the vertebral arteries. No aneurysm. IMPRESSION: 1. No significant stenosis of the intracranial vasculature. Electronically signed by: Franky Stanford MD MD 03/16/2024 11:07 PM EST RP Workstation: [id number]   MR BRAIN W WO CONTRAST Result Date: 03/16/2024 EXAM: MRI BRAIN WITH AND WITHOUT CONTRAST 03/16/2024 01:57:00 PM TECHNIQUE: Multiplanar multisequence MRI of the head/brain was performed with and without the administration of intravenous contrast. CONTRAST: 6 mL Gadavist  (gadobutrol  1 MMOL/ML injection). COMPARISON: CT head 03/15/24 CLINICAL HISTORY: Neuro deficit, acute, stroke suspected. FINDINGS: BRAIN AND VENTRICLES: No acute infarct. No acute intracranial hemorrhage. Florella Mcneese few foci of susceptibility artifact in the cerebral hemispheres are compatible with chronic microhemorrhages. No mass effect or midline shift. No hydrocephalus. The sella is unremarkable. Normal flow voids. Jalayne Ganesh small focus of enhancement in the pons with no surrounding edema and associated susceptibility artifact, compatible with capillary telangiectasia. Mild chronic microvascular ischemic disease. ORBITS: No acute abnormality. SINUSES: No acute abnormality. BONES AND SOFT  TISSUES: Normal bone marrow signal and enhancement. No acute soft tissue abnormality. IMPRESSION: 1. No evidence of acute abnormality. 2. Benign capillary telangiectasia of the pons. Electronically signed by: Gilmore Molt MD MD 03/16/2024 04:34 PM EST RP Workstation: [id number]   CT HEAD WO CONTRAST Result Date: 03/15/2024 EXAM: CT HEAD WITHOUT CONTRAST 03/15/2024 09:20:52 PM TECHNIQUE: CT of the head was performed without the administration of intravenous contrast. Automated exposure control, iterative reconstruction, and/or weight based adjustment of the mA/kV was utilized to reduce the radiation dose to as low as reasonably achievable. COMPARISON: None available. CLINICAL HISTORY: Neuro deficit, acute, stroke suspected. FINDINGS: BRAIN AND VENTRICLES: No acute hemorrhage. No evidence of acute infarct. Mild chronic microvascular ischemic change. No hydrocephalus. No extra-axial collection. No mass effect or midline shift. ORBITS: Bilateral lens replacement. SINUSES: No acute abnormality. SOFT TISSUES AND SKULL: No acute soft tissue abnormality. No skull fracture. IMPRESSION: 1. No acute intracranial abnormality. 2. Mild chronic microvascular ischemic change.  Electronically signed by: Franky Stanford MD MD 03/15/2024 09:30 PM EST RP Workstation: [id number]    Microbiology: No results found for this or any previous visit (from the past 240 hours).   Labs: Basic Metabolic Panel: Recent Labs  Lab 03/15/24 2105 03/17/24 0200  NA 135 137  K 3.7 4.4  CL 93* 100  CO2 25 24  GLUCOSE 113* 107*  BUN 26* 17  CREATININE 1.29* 0.94  CALCIUM 9.2 9.0   Liver Function Tests: Recent Labs  Lab 03/15/24 2105  AST 18  ALT 8  ALKPHOS 54  BILITOT 0.5  PROT 7.3  ALBUMIN 4.0   No results for input(s): LIPASE, AMYLASE in the last 168 hours. No results for input(s): AMMONIA in the last 168 hours. CBC: Recent Labs  Lab 03/15/24 2105 03/17/24 0200  WBC 7.7 7.1  NEUTROABS 3.6  --   HGB 14.2 13.6   HCT 42.2 39.9  MCV 86.3 85.6  PLT 287 279   Cardiac Enzymes: No results for input(s): CKTOTAL, CKMB, CKMBINDEX, TROPONINI in the last 168 hours. BNP: BNP (last 3 results) No results for input(s): BNP in the last 8760 hours.  ProBNP (last 3 results) No results for input(s): PROBNP in the last 8760 hours.  CBG: Recent Labs  Lab 03/15/24 2029  GLUCAP 114*       Signed:  Meliton Monte MD.  [facility] 03/17/2024, 5:01 PM       [1]  Allergies Allergen Reactions   Carboplatin     Positive skin test documented.   Latex Other (See Comments)    Break out in blisters   Penicillins     Since childhood Has patient had Minard Millirons PCN reaction causing immediate rash, facial/tongue/throat swelling, SOB or lightheadedness with hypotension: Unknown Has patient had Barbie Croston PCN reaction causing severe rash involving mucus membranes or skin necrosis: Unknown Has patient had Bernese Doffing PCN reaction that required hospitalization Unknown Has patient had Jarid Sasso PCN reaction occurring within the last 10 years: No If all of the above answers are NO, then may proceed with Cephalosporin use.    Sulfa Drugs Cross Reactors Nausea And Vomiting   Adhesive [Tape]    Hydrocodone     Iodinated Contrast Media Other (See Comments)   Iodine Other (See Comments)    Gallbladder dye contrast tablets, pt states is not allergic to Iodine Has had other dye test with no problems.   Sulfa Antibiotics Rash

## 2024-03-17 NOTE — Progress Notes (Signed)
 Echocardiogram 2D Echocardiogram has been performed.  Alyssa Jackson 03/17/2024, 3:35 PM

## 2024-03-17 NOTE — Care Management Obs Status (Signed)
 MEDICARE OBSERVATION STATUS NOTIFICATION   Patient Details  Name: TIEGAN JAMBOR MRN: [id number] Date of Birth: [birth year]   Medicare Observation Status Notification Given:  Yes    Malcolm Hetz G., RN 03/17/2024, 9:03 AM

## 2024-03-17 NOTE — Progress Notes (Signed)
 STROKE TEAM PROGRESS NOTE   INTERIM HISTORY/SUBJECTIVE Seen in room with family member at the bedside.  Patient recounts events that led her to the hospital.  She was in usual state of health until she suddenly had trouble with spelling.  She tried to text her son help but came out as  yelp.  Family friend who is a family practitioner saw her and was concerned enough about a stroke to send her to the emergency department.  Symptoms altogether lasted about 2 hours and only consisted of mild language deficits.  She does have history of migraines though none complicated with other neurological symptoms.  Today she is in her usual state of health.  OBJECTIVE  CBC    Component Value Date/Time   WBC 7.1 03/17/2024 0200   RBC 4.66 03/17/2024 0200   HGB 13.6 03/17/2024 0200   HGB 14.3 07/07/2015 0859   HCT 39.9 03/17/2024 0200   HCT 43.9 07/07/2015 0859   PLT 279 03/17/2024 0200   PLT 237 07/07/2015 0859   MCV 85.6 03/17/2024 0200   MCV 86.5 07/07/2015 0859   MCH 29.2 03/17/2024 0200   MCHC 34.1 03/17/2024 0200   RDW 13.1 03/17/2024 0200   RDW 13.9 07/07/2015 0859   LYMPHSABS 2.7 03/15/2024 2105   LYMPHSABS 2.4 07/07/2015 0859   MONOABS 1.1 (H) 03/15/2024 2105   MONOABS 0.5 07/07/2015 0859   EOSABS 0.1 03/15/2024 2105   EOSABS 0.1 07/07/2015 0859   BASOSABS 0.1 03/15/2024 2105   BASOSABS 0.0 07/07/2015 0859    BMET    Component Value Date/Time   NA 137 03/17/2024 0200   NA 137 07/07/2015 0859   K 4.4 03/17/2024 0200   K 4.2 07/07/2015 0859   CL 100 03/17/2024 0200   CL 101 08/20/2012 1542   CO2 24 03/17/2024 0200   CO2 25 07/07/2015 0859   GLUCOSE 107 (H) 03/17/2024 0200   GLUCOSE 168 (H) 07/07/2015 0859   GLUCOSE 87 08/20/2012 1542   BUN 17 03/17/2024 0200   BUN 16.7 07/07/2015 0859   CREATININE 0.94 03/17/2024 0200   CREATININE 1.0 07/07/2015 0859   CALCIUM 9.0 03/17/2024 0200   CALCIUM 9.8 07/07/2015 0859   EGFR 61 (L) 07/07/2015 0859   GFRNONAA >60 03/17/2024  0200    IMAGING past 24 hours MR ANGIO HEAD WO CONTRAST Result Date: 03/16/2024 EXAM: MR Angiography Head without intravenous Contrast. 03/16/2024 10:32:00 PM TECHNIQUE: Magnetic resonance angiography images of the head without intravenous contrast. Multiplanar 2D and 3D reformatted images are provided for review. COMPARISON: MR Brain With and Without IV Contrast 03/16/2024 01:28 PM. CLINICAL HISTORY: Stroke, follow up. FINDINGS: ANTERIOR CIRCULATION: No significant stenosis of the internal carotid arteries. No significant stenosis of the anterior cerebral arteries. No significant stenosis of the middle cerebral arteries. No aneurysm. POSTERIOR CIRCULATION: No significant stenosis of the posterior cerebral arteries. No significant stenosis of the basilar artery. No significant stenosis of the vertebral arteries. No aneurysm. IMPRESSION: 1. No significant stenosis of the intracranial vasculature. Electronically signed by: Franky Stanford MD MD 03/16/2024 11:07 PM EST RP Workstation: [id number]   MR BRAIN W WO CONTRAST Result Date: 03/16/2024 EXAM: MRI BRAIN WITH AND WITHOUT CONTRAST 03/16/2024 01:57:00 PM TECHNIQUE: Multiplanar multisequence MRI of the head/brain was performed with and without the administration of intravenous contrast. CONTRAST: 6 mL Gadavist  (gadobutrol  1 MMOL/ML injection). COMPARISON: CT head 03/15/24 CLINICAL HISTORY: Neuro deficit, acute, stroke suspected. FINDINGS: BRAIN AND VENTRICLES: No acute infarct. No acute intracranial hemorrhage. A few foci of  susceptibility artifact in the cerebral hemispheres are compatible with chronic microhemorrhages. No mass effect or midline shift. No hydrocephalus. The sella is unremarkable. Normal flow voids. A small focus of enhancement in the pons with no surrounding edema and associated susceptibility artifact, compatible with capillary telangiectasia. Mild chronic microvascular ischemic disease. ORBITS: No acute abnormality. SINUSES: No acute  abnormality. BONES AND SOFT TISSUES: Normal bone marrow signal and enhancement. No acute soft tissue abnormality. IMPRESSION: 1. No evidence of acute abnormality. 2. Benign capillary telangiectasia of the pons. Electronically signed by: Gilmore Molt MD MD 03/16/2024 04:34 PM EST RP Workstation: [id number]    Vitals:   03/16/24 2316 03/17/24 0501 03/17/24 0759 03/17/24 1141  BP: 135/65 125/68 (!) 121/59 (!) 112/54  Pulse: 77 77 74 79  Resp: 18  15 14   Temp: (!) 97.5 F (36.4 C) 98 F (36.7 C) 97.6 F (36.4 C) 97.8 F (36.6 C)  TempSrc: Oral Oral Oral Oral  SpO2: 100% 97% 100% 100%  Weight:      Height:         PHYSICAL EXAM General:  Alert, well-nourished, well-developed patient in no acute distress Psych:  Mood and affect appropriate for situation CV: Regular rate and rhythm on monitor Respiratory:  Regular, unlabored respirations on room air GI: Abdomen soft and nontender   NEURO:  Mental Status: AA&Ox3, patient is able to give clear and coherent history Speech/Language: speech is without dysarthria or aphasia.  Naming, repetition, fluency, and comprehension intact.  Cranial Nerves:  II: PERRL. Visual fields full.  III, IV, VI: EOMI. Eyelids elevate symmetrically.  V: Sensation is intact to light touch and symmetrical to face.  VII: Face is symmetrical resting and smiling VIII: hearing intact to voice. IX, X: Palate elevates symmetrically. Phonation is normal.  KP:Dynloizm shrug 5/5. XII: tongue is midline without fasciculations. Motor: 5/5 strength to all muscle groups tested.  Very subtle left-sided pronator drift Tone: is normal and bulk is normal Sensation- Intact to light touch bilaterally. Extinction absent to light touch to DSS.   Coordination: FTN intact bilaterally, HKS: no ataxia in BLE. Slight pronator drift  on the left brain  Gait- deferred  Most Recent NIH 0    ASSESSMENT/PLAN  Alyssa Jackson is a 79 y.o. female with history of anxiety,  migraines, hypertension, hyperlipidemia, history of breast cancer, type 2 diabetes had a sudden onset of transient word finding difficulty and speech issues that lasted about 2 hours, which started after developing a headache.  NIH on Admission 0.  She is left-handed, and the mild left pronator drift with an episode of language disturbance could suggest a very small MRI negative right MCA territory infarct that this does remain unlikely.  She is already on aspirin  and statin which should provide adequate stroke prevention, however we will continue DAPT with Plavix  for 3 weeks.  TIA  Etiology:  unclear Code Stroke CT head No acute abnormality. Mild Small vessel disease. ASPECTS 10.    MRI  mild microvascular disease MRA  unremarkable Carotid Doppler  prelim read left 40-59% ICA stenosis - outpatient follow up 2D Echo pending LDL 76 HgbA1c 5.9 VTE prophylaxis - scd aspirin  81 mg daily prior to admission, now on aspirin  81 mg daily and clopidogrel  75 mg daily for 3 weeks and then Aspirin  81mg  alone. Therapy recommendations:  na Disposition:  home  Hypertension Home meds:  Lisinopril  Stable Blood Pressure Goal: BP less than 220/110   Hyperlipidemia Home meds:  Pravastatin , resumed in hospital LDL  76, goal < 70 Continue statin at discharge  Diabetes type II Controlled Home meds:  Metformin   HgbA1c 5.9, goal < 7.0 CBGs SSI Recommend close follow-up with PCP for better DM control   Dysphagia Patient has post-stroke dysphagia, SLP consulted    Diet   Diet regular Room service appropriate? Yes; Fluid consistency: Thin   Advance diet as tolerated  Other Stroke Risk Factors Migraines- has a history of migraines  Other Active Problems   Hospital day # 0  Attending Neurohospitalist Addendum Patient seen and examined with APP/Resident. Agree with the history and physical as documented above. Agree with the plan as documented, which I helped formulate. I have independently  reviewed the chart, obtained history, review of systems and examined the patient.I have personally reviewed pertinent head/neck/spine imaging (CT/MRI). Please feel free to call with any questions.  Sarajane Daring, MD [facility]     To contact Stroke Continuity provider, please refer to [email or web address]. After hours, contact General Neurology

## 2024-03-17 NOTE — Procedures (Signed)
 Patient Name: Alyssa Jackson  MRN: [id number]  Epilepsy Attending: Arlin MALVA Krebs  Referring Physician/Provider: Georgina Basket, MD  Date: 03/17/2024 Duration: 22.29 mins  Patient history: 79 y.o. female who had a sudden onset of transient word finding difficulty and speech issues that lasted about 2 hours, which started after developing a headache. EEG to evaluate for seizure  Level of alertness: Awake  AEDs during EEG study: None  Technical aspects: This EEG study was done with scalp electrodes positioned according to the 10-20 International system of electrode placement. Electrical activity was reviewed with band pass filter of 1-70Hz , sensitivity of 7 uV/mm, display speed of 14mm/sec with a 60Hz  notched filter applied as appropriate. EEG data were recorded continuously and digitally stored.  Video monitoring was available and reviewed as appropriate.  Description: The posterior dominant rhythm consists of 8-9Hz  activity of moderate voltage (25-35 uV) seen predominantly in posterior head regions, symmetric and reactive to eye opening and eye closing. Hyperventilation and photic stimulation were not performed.     IMPRESSION: This study is within normal limits. No seizures or epileptiform discharges were seen throughout the recording.  A normal interictal EEG does not exclude the diagnosis of epilepsy.   Fidelis Loth O Denine Brotz

## 2024-03-17 NOTE — Progress Notes (Signed)
 Carotid duplex  has been completed. Refer to [facility] under chart review to view preliminary results.   03/17/2024  2:14 PM Alyssa Jackson, Ricka BIRCH

## 2024-03-18 LAB — T3: T3, Total: 53 ng/dL — ABNORMAL LOW (ref 71–180)
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
## 2024-03-22 ENCOUNTER — Ambulatory Visit: Attending: Nurse Practitioner | Admitting: Nurse Practitioner

## 2024-03-22 VITALS — BP 130/78 | HR 96 | Ht 64.0 in | Wt 112.1 lb

## 2024-03-22 DIAGNOSIS — I425 Other restrictive cardiomyopathy: Secondary | ICD-10-CM

## 2024-03-22 DIAGNOSIS — E039 Hypothyroidism, unspecified: Secondary | ICD-10-CM

## 2024-03-22 DIAGNOSIS — I6522 Occlusion and stenosis of left carotid artery: Secondary | ICD-10-CM

## 2024-03-22 DIAGNOSIS — I10 Essential (primary) hypertension: Secondary | ICD-10-CM | POA: Diagnosis not present

## 2024-03-22 DIAGNOSIS — E118 Type 2 diabetes mellitus with unspecified complications: Secondary | ICD-10-CM | POA: Diagnosis not present

## 2024-03-22 DIAGNOSIS — I428 Other cardiomyopathies: Secondary | ICD-10-CM

## 2024-03-22 DIAGNOSIS — I158 Other secondary hypertension: Secondary | ICD-10-CM

## 2024-03-22 DIAGNOSIS — E785 Hyperlipidemia, unspecified: Secondary | ICD-10-CM

## 2024-03-22 DIAGNOSIS — G459 Transient cerebral ischemic attack, unspecified: Secondary | ICD-10-CM | POA: Diagnosis not present

## 2024-03-22 MED ORDER — DIPHENHYDRAMINE HCL 50 MG PO CAPS: ORAL_CAPSULE | ORAL | 0 refills | Status: AC

## 2024-03-22 MED ORDER — PREDNISONE 50 MG PO TABS: ORAL_TABLET | ORAL | 0 refills | Status: AC

## 2024-03-22 MED ORDER — METOPROLOL TARTRATE 100 MG PO TABS: 100.0000 mg | ORAL_TABLET | Freq: Once | ORAL | 0 refills | Status: AC

## 2024-03-22 NOTE — Patient Instructions (Signed)
 Medication Instructions:  Your physician recommends that you continue on your current medications as directed. Please refer to the Current Medication list given to you today.  *If you need a refill on your cardiac medications before your next appointment, please call your pharmacy*  Lab Work: None ordered  If you have labs (blood work) drawn today and your tests are completely normal, you will receive your results only by: MyChart Message (if you have MyChart) OR A paper copy in the mail If you have any lab test that is abnormal or we need to change your treatment, we will call you to review the results.  Testing/Procedures: Your physician has requested that you have an limited echocardiogram IN APRIL 2026. Echocardiography is a painless test that uses sound waves to create images of your heart. It provides your doctor with information about the size and shape of your heart and how well your hearts chambers and valves are working. This procedure takes approximately one hour. There are no restrictions for this procedure. Please do NOT wear cologne, perfume, aftershave, or lotions (deodorant is allowed). Please arrive 15 minutes prior to your appointment time.  Please note: We ask at that you not bring children with you during ultrasound (echo/ vascular) testing. Due to room size and safety concerns, children are not allowed in the ultrasound rooms during exams. Our front office staff cannot provide observation of children in our lobby area while testing is being conducted. An adult accompanying a patient to their appointment will only be allowed in the ultrasound room at the discretion of the ultrasound technician under special circumstances. We apologize for any inconvenience.   Your physician has requested that you have cardiac CT. Cardiac computed tomography (CT) is a painless test that uses an x-ray machine to take clear, detailed pictures of your heart. For further information please visit  [email or web address] Please follow instruction sheet BELOW:    Your cardiac CT  has been  scheduled at   [facility] and Vascular Tower [address]  [place], [place] [address]  04/02/24 ARRIVE AT 2:00  If scheduled at [facility], please arrive at the [facility] and Children's Entrance (Entrance C2) of [facility] 30 minutes prior to test start time.  If scheduled at the Heart and Vascular Tower at [address], please enter the parking lot using the [address] entrance and use the FREE valet service at the patient drop-off area. Enter the building and check-in with registration on the main floor.   Please follow these instructions carefully (unless otherwise directed):  An IV will be required for this test and Nitroglycerin will be given.    On the Night Before the Test: Be sure to Drink plenty of water. Do not consume any caffeinated/decaffeinated beverages or chocolate 12 hours prior to your test. Do not take any antihistamines 12 hours prior to your test.  If the patient has contrast allergy: Patient will need a prescription for Prednisone  and very clear instructions (as follows): Prednisone  50 mg - take 13 hours prior to test Take another Prednisone  50 mg 7 hours prior to test Take another Prednisone  50 mg 1 hour prior to test Take Benadryl  50 mg 1 hour prior to test Patient must complete all four doses of above prophylactic medications. Patient will need a ride after test due to Benadryl .  On the Day of the Test: Drink plenty of water until 1 hour prior to the test. Do not eat any food 1 hour prior to  test. You may take your regular medications prior to the test.  Take metoprolol  (Lopressor ) 100 MG two hours prior to test. THIS HAS BEEN SENT TO [facility] Patients who wear a continuous glucose monitor MUST remove the device prior to scanning. FEMALES- please wear underwire-free bra if available, avoid dresses & tight clothing          After the Test: Drink plenty of water. After receiving IV contrast, you may experience a mild flushed feeling. This is normal. On occasion, you may experience a mild rash up to 24 hours after the test. This is not dangerous. If this occurs, you can take Benadryl  25 mg, Zyrtec, Claritin, or Allegra and increase your fluid intake. (Patients taking Tikosyn should avoid Benadryl , and may take Zyrtec, Claritin, or Allegra) If you experience trouble breathing, this can be serious. If it is severe call 911 IMMEDIATELY. If it is mild, please call our office.  We will call to schedule your test 2-4 weeks out understanding that some insurance companies will need an authorization prior to the service being performed.   For more information and frequently asked questions, please visit our website : [email or web address]  For non-scheduling related questions, please contact the cardiac imaging nurse navigator should you have any questions/concerns: Cardiac Imaging Nurse Navigators Direct Office Dial: [phone]   For scheduling needs, including cancellations and rescheduling, please call Brittany, [phone].   [employer] Cardiac Event Monitor Instructions  Your physician has requested you wear your cardiac event monitor for _____ days, (1-30). [employer] may call or text to confirm a shipping address. The monitor will be sent to a land address via UPS. [employer] will not ship a monitor to a PO BOX. It typically takes 3-5 days to receive your monitor after it has been enrolled. [employer] will assist with USPS tracking if your package is delayed. The telephone number for [employer] is [phone]. Once you have received your monitor, please review the enclosed instructions. Instruction tutorials can also be viewed under help and settings on the enclosed cell phone. Your monitor has already been registered assigning a specific monitor serial # to you.  Billing and Self Pay  Discount Information  [employer] has been provided the insurance information we had on file for you.  If your insurance has been updated, please call [employer] at [phone] to provide them with your updated insurance information.   [employer] offers a discounted Self Pay option for patients who have insurance that does not cover their cardiac event monitor or patients without insurance.  The discounted cost of a Self Pay Cardiac Event Monitor would be $225.00 , if the patient contacts [employer] at [phone] within 7 days of applying the monitor to make payment arrangements.  If the patient does not contact [employer] within 7 days of applying the monitor, the cost of the cardiac event monitor will be $350.00.  Applying the monitor  Remove cell phone from case and turn it on. The cell phone works as it consultant and needs to be within [facility] of you at all times. The cell phone will need to be charged on a daily basis. We recommend you plug the cell phone into the enclosed charger at your bedside table every night.  Monitor batteries: You will receive two monitor batteries labelled #1 and #2. These are your recorders. Plug battery #2 onto the second connection on the enclosed charger. Keep one battery on the charger at all times. This will keep the monitor battery deactivated. It will also keep it  fully charged for when you need to switch your monitor batteries. A small light will be blinking on the battery emblem when it is charging. The light on the battery emblem will remain on when the battery is fully charged.  Open package of a Monitor strip. Insert battery #1 into black hood on strip and gently squeeze monitor battery onto connection as indicated in instruction booklet. Set aside while preparing skin.  Choose location for your strip, vertical or horizontal, as indicated in the instruction booklet. Shave to remove all hair from location. There cannot be any lotions, oils,  powders, or colognes on skin where monitor is to be applied. Wipe skin clean with enclosed Saline wipe. Dry skin completely.  Peel paper labeled #1 off the back of the Monitor strip exposing the adhesive. Place the monitor on the chest in the vertical or horizontal position shown in the instruction booklet. One arrow on the monitor strip must be pointing upward. Carefully remove paper labeled #2, attaching remainder of strip to your skin. Try not to create any folds or wrinkles in the strip as you apply it.  Firmly press and release the circle in the center of the monitor battery. You will hear a small beep. This is turning the monitor battery on. The heart emblem on the monitor battery will light up every 5 seconds if the monitor battery in turned on and connected to the patient securely. Do not push and hold the circle down as this turns the monitor battery off. The cell phone will locate the monitor battery. A screen will appear on the cell phone checking the connection of your monitor strip. This may read poor connection initially but change to good connection within the next minute. Once your monitor accepts the connection you will hear a series of 3 beeps followed by a climbing crescendo of beeps. A screen will appear on the cell phone showing the two monitor strip placement options. Touch the picture that demonstrates where you applied the monitor strip.  Your monitor strip and battery are waterproof. You are able to shower, bathe, or swim with the monitor on. They just ask you do not submerge deeper than 3 feet underwater. We recommend removing the monitor if you are swimming in a lake, river, or ocean.  Your monitor battery will need to be switched to a fully charged monitor battery approximately once a week. The cell phone will alert you of an action which needs to be made.  On the cell phone, tap for details to reveal connection status, monitor battery status, and cell phone  battery status. The green dots indicates your monitor is in good status. A red dot indicates there is something that needs your attention.  To record a symptom, click the circle on the monitor battery. In 30-60 seconds a list of symptoms will appear on the cell phone. Select your symptom and tap save. Your monitor will record a sustained or significant arrhythmia regardless of you clicking the button. Some patients do not feel the heart rhythm irregularities. [employer] will notify us  of any serious or critical events.  Refer to instruction booklet for instructions on switching batteries, changing strips, the Do not disturb or Pause features, or any additional questions.  Call [employer] at [phone], to confirm your monitor is transmitting and record your baseline. They will answer any questions you may have regarding the monitor instructions at that time.  Returning the monitor to [employer]  Place all equipment back into blue box. Peel off  strip of paper to expose adhesive and close box securely. There is a prepaid UPS shipping label on this box. Drop in a UPS drop box, or at a UPS facility like [employer]. You may also contact [employer] to arrange UPS to pick up monitor package at your home.   Follow-Up: At [facility], you and your health needs are our priority.  As part of our continuing mission to provide you with exceptional heart care, our providers are all part of one team.  This team includes your primary Cardiologist (physician) and Advanced Practice Providers or APPs (Physician Assistants and Nurse Practitioners) who all work together to provide you with the care you need, when you need it.  Your next appointment:   3 month(s)  Provider:   Damien Braver, NP          We recommend signing up for the patient portal called MyChart.  Sign up information is provided on this After Visit Summary.  MyChart is used to connect with patients for Virtual Visits  (Telemedicine).  Patients are able to view lab/test results, encounter notes, upcoming appointments, etc.  Non-urgent messages can be sent to your provider as well.   To learn more about what you can do with MyChart, go to [email or web address].   Other Instructions
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
## 2024-03-25 ENCOUNTER — Encounter: Payer: Self-pay | Admitting: Nurse Practitioner

## 2024-03-25 ENCOUNTER — Encounter: Payer: Self-pay | Admitting: Surgery

## 2024-03-25 ENCOUNTER — Ambulatory Visit: Attending: Surgery | Admitting: Surgery

## 2024-03-25 VITALS — BP 142/82 | HR 88 | Temp 97.8°F | Ht 64.0 in | Wt 110.0 lb

## 2024-03-25 DIAGNOSIS — I6523 Occlusion and stenosis of bilateral carotid arteries: Secondary | ICD-10-CM

## 2024-03-26 ENCOUNTER — Telehealth: Payer: Self-pay | Admitting: Nurse Practitioner

## 2024-03-26 NOTE — Telephone Encounter (Signed)
 Pt wants to discuss whether she can where her heart monitor for 30 days and then have her Cardiac CT. Please advise

## 2024-03-27 NOTE — Telephone Encounter (Signed)
 Please advise.

## 2024-03-27 NOTE — Telephone Encounter (Signed)
 Spoke with pt. Pt was advised to complete the cardiac CTA and then wear the monitor for 30 days. Pt would like to know why this should be the order in which she completes the test ordered. Pt feels the cardiac cta is invasive and the monitor isn't. Please advise.

## 2024-03-29 NOTE — Telephone Encounter (Signed)
 Patient will come after her CT on 04/02/24 @ 3 to have monitor applied

## 2024-03-29 NOTE — Telephone Encounter (Signed)
 Patient is calling in to see if she can come into office to have the monitor put on. She has a hard time trying to do it herself. Please advise

## 2024-04-01 ENCOUNTER — Telehealth (HOSPITAL_COMMUNITY): Payer: Self-pay | Admitting: Emergency Medicine

## 2024-04-01 ENCOUNTER — Encounter (HOSPITAL_COMMUNITY): Payer: Self-pay

## 2024-04-01 NOTE — Telephone Encounter (Signed)
 Attempted to call patient regarding upcoming cardiac CT appointment. Left message on voicemail with name and callback number Rockwell Alexandria RN Navigator Cardiac Imaging [facility] [phone] Office [phone] Cell

## 2024-04-01 NOTE — Telephone Encounter (Signed)
 Reaching out to patient to offer assistance regarding upcoming cardiac imaging study; pt verbalizes understanding of appt date/time, parking situation and where to check in, pre-test NPO status and medications ordered, and verified current allergies; name and call back number provided for further questions should they arise Camie Shutter RN Navigator Cardiac Imaging [facility] [phone] office [phone] cell   13 hrs prep and metoprolol  reviewed

## 2024-04-02 ENCOUNTER — Ambulatory Visit

## 2024-04-02 ENCOUNTER — Other Ambulatory Visit (HOSPITAL_COMMUNITY)

## 2024-04-02 ENCOUNTER — Ambulatory Visit (HOSPITAL_COMMUNITY)
Admission: RE | Admit: 2024-04-02 | Discharge: 2024-04-02 | Disposition: A | Discharge status: Home / Self Care | Source: Ambulatory Visit

## 2024-04-02 DIAGNOSIS — I10 Essential (primary) hypertension: Secondary | ICD-10-CM | POA: Diagnosis not present

## 2024-04-02 DIAGNOSIS — G459 Transient cerebral ischemic attack, unspecified: Secondary | ICD-10-CM | POA: Diagnosis not present

## 2024-04-02 DIAGNOSIS — I428 Other cardiomyopathies: Secondary | ICD-10-CM | POA: Diagnosis not present

## 2024-04-02 MED ORDER — NITROGLYCERIN 0.4 MG SL SUBL
0.8000 mg | SUBLINGUAL_TABLET | Freq: Once | SUBLINGUAL | Status: AC
  Administered 2024-04-02: 0.8 mg via SUBLINGUAL

## 2024-04-02 MED ORDER — IOHEXOL 350 MG/ML SOLN
100.0000 mL | Freq: Once | INTRAVENOUS | Status: AC | PRN
  Administered 2024-04-02: 100 mL via INTRAVENOUS

## 2024-04-08 ENCOUNTER — Ambulatory Visit: Payer: Self-pay | Admitting: Nurse Practitioner

## 2024-04-25 ENCOUNTER — Ambulatory Visit: Admitting: Neurology

## 2024-06-20 ENCOUNTER — Ambulatory Visit (HOSPITAL_COMMUNITY)

## 2024-06-24 ENCOUNTER — Ambulatory Visit: Admitting: Nurse Practitioner
